# Patient Record
Sex: Male | Born: 1975
Health system: Southern US, Community
[De-identification: ages and names within clinical notes are randomized; demographics above are authoritative.]

## PROBLEM LIST (undated history)

## (undated) DIAGNOSIS — Z992 Dependence on renal dialysis: Secondary | ICD-10-CM

## (undated) DIAGNOSIS — K219 Gastro-esophageal reflux disease without esophagitis: Secondary | ICD-10-CM

## (undated) DIAGNOSIS — M7730 Calcaneal spur, unspecified foot: Secondary | ICD-10-CM

## (undated) DIAGNOSIS — I10 Essential (primary) hypertension: Secondary | ICD-10-CM

## (undated) DIAGNOSIS — L309 Dermatitis, unspecified: Secondary | ICD-10-CM

## (undated) DIAGNOSIS — Z91199 Patient's noncompliance with other medical treatment and regimen due to unspecified reason: Secondary | ICD-10-CM

## (undated) DIAGNOSIS — R52 Pain, unspecified: Secondary | ICD-10-CM

## (undated) DIAGNOSIS — N186 End stage renal disease: Secondary | ICD-10-CM

## (undated) DIAGNOSIS — F419 Anxiety disorder, unspecified: Secondary | ICD-10-CM

## (undated) DIAGNOSIS — G2581 Restless legs syndrome: Secondary | ICD-10-CM

## (undated) DIAGNOSIS — M1990 Unspecified osteoarthritis, unspecified site: Secondary | ICD-10-CM

## (undated) DIAGNOSIS — J45909 Unspecified asthma, uncomplicated: Secondary | ICD-10-CM

## (undated) DIAGNOSIS — T8571XA Infection and inflammatory reaction due to peritoneal dialysis catheter, initial encounter: Secondary | ICD-10-CM

## (undated) DIAGNOSIS — Z9119 Patient's noncompliance with other medical treatment and regimen: Secondary | ICD-10-CM

## (undated) DIAGNOSIS — G4733 Obstructive sleep apnea (adult) (pediatric): Secondary | ICD-10-CM

## (undated) DIAGNOSIS — Z9989 Dependence on other enabling machines and devices: Secondary | ICD-10-CM

## (undated) DIAGNOSIS — N189 Chronic kidney disease, unspecified: Secondary | ICD-10-CM

## (undated) HISTORY — DX: Unspecified osteoarthritis, unspecified site: M19.90

## (undated) HISTORY — DX: Chronic kidney disease, unspecified: N18.9

## (undated) HISTORY — PX: INSERTION OF DIALYSIS CATHETER: SHX1324

## (undated) HISTORY — DX: Essential (primary) hypertension: I10

## (undated) HISTORY — PX: OTHER SURGICAL HISTORY: SHX169

---

## 2007-11-07 ENCOUNTER — Emergency Department (HOSPITAL_COMMUNITY): Admission: EM | Admit: 2007-11-07 | Discharge: 2007-11-08 | Payer: Self-pay | Admitting: Emergency Medicine

## 2009-10-22 ENCOUNTER — Emergency Department (HOSPITAL_COMMUNITY): Admission: EM | Admit: 2009-10-22 | Discharge: 2009-10-22 | Payer: Self-pay | Admitting: Emergency Medicine

## 2010-07-24 LAB — DIFFERENTIAL
Basophils Absolute: 0 10*3/uL (ref 0.0–0.1)
Eosinophils Absolute: 0.2 10*3/uL (ref 0.0–0.7)
Monocytes Absolute: 0.5 10*3/uL (ref 0.1–1.0)
Monocytes Relative: 6 % (ref 3–12)
Neutro Abs: 5.2 10*3/uL (ref 1.7–7.7)

## 2010-07-24 LAB — CBC
HCT: 42.6 % (ref 39.0–52.0)
WBC: 8.5 10*3/uL (ref 4.0–10.5)

## 2010-07-24 LAB — BRAIN NATRIURETIC PEPTIDE: Pro B Natriuretic peptide (BNP): <30 pg/mL (ref 0.0–100.0)

## 2010-07-24 LAB — BASIC METABOLIC PANEL
CO2: 24 mEq/L (ref 19–32)
Chloride: 110 mEq/L (ref 96–112)
Creatinine, Ser: 1.64 mg/dL — ABNORMAL HIGH (ref 0.4–1.5)
Sodium: 139 mEq/L (ref 135–145)

## 2011-02-02 LAB — POCT CARDIAC MARKERS
CKMB, poc: 2.1
Myoglobin, poc: 183
Operator id: 237661
Troponin i, poc: <0.05

## 2013-02-25 ENCOUNTER — Encounter: Payer: Self-pay | Admitting: Physical Medicine & Rehabilitation

## 2013-03-05 ENCOUNTER — Encounter: Payer: Self-pay | Admitting: Physical Medicine & Rehabilitation

## 2013-03-05 ENCOUNTER — Encounter
Payer: Federal, State, Local not specified - PPO | Attending: Physical Medicine & Rehabilitation | Admitting: Physical Medicine & Rehabilitation

## 2013-03-05 ENCOUNTER — Encounter (INDEPENDENT_AMBULATORY_CARE_PROVIDER_SITE_OTHER): Payer: Self-pay

## 2013-03-05 VITALS — BP 162/91 | HR 81 | Resp 16 | Ht 67.0 in | Wt 232.0 lb

## 2013-03-05 DIAGNOSIS — Q649 Congenital malformation of urinary system, unspecified: Secondary | ICD-10-CM | POA: Insufficient documentation

## 2013-03-05 DIAGNOSIS — M79609 Pain in unspecified limb: Secondary | ICD-10-CM | POA: Insufficient documentation

## 2013-03-05 DIAGNOSIS — Z79899 Other long term (current) drug therapy: Secondary | ICD-10-CM

## 2013-03-05 DIAGNOSIS — M722 Plantar fascial fibromatosis: Secondary | ICD-10-CM

## 2013-03-05 DIAGNOSIS — E669 Obesity, unspecified: Secondary | ICD-10-CM | POA: Insufficient documentation

## 2013-03-05 DIAGNOSIS — N189 Chronic kidney disease, unspecified: Secondary | ICD-10-CM | POA: Insufficient documentation

## 2013-03-05 DIAGNOSIS — M109 Gout, unspecified: Secondary | ICD-10-CM | POA: Insufficient documentation

## 2013-03-05 DIAGNOSIS — I10 Essential (primary) hypertension: Secondary | ICD-10-CM | POA: Insufficient documentation

## 2013-03-05 DIAGNOSIS — M069 Rheumatoid arthritis, unspecified: Secondary | ICD-10-CM | POA: Insufficient documentation

## 2013-03-05 DIAGNOSIS — G8929 Other chronic pain: Secondary | ICD-10-CM | POA: Insufficient documentation

## 2013-03-05 DIAGNOSIS — Z5181 Encounter for therapeutic drug level monitoring: Secondary | ICD-10-CM

## 2013-03-05 MED ORDER — PREDNISONE 5 MG PO TABS: 5.0000 mg | ORAL_TABLET | Freq: Every day | ORAL | Status: DC

## 2013-03-05 MED ORDER — DICLOFENAC SODIUM 1 % TD GEL: 1.0000 "application " | Freq: Four times a day (QID) | TRANSDERMAL | Status: DC

## 2013-03-05 NOTE — Progress Notes (Signed)
Subjective:    Patient ID: James Kim, male    DOB: [birth year], 37 y.o.   MRN: [id number]  HPI  This is an initial visit for James Kim who is here regarding chronic foot pain by referral of Dr. Nolen Mu. He was diagnosed with rheumatoid arthritis about a year ago. He also has had chronic gout for several years prior. He was sent to Dr. Nolen Mu who began seeing him April 2014. He has been injecting his feet and treating plantar fasciitis. Per the patient, he's had 4 or 5 injections this year. The injections have typically provided a couple days worth of relief. The effects of the injections were usually limited by his other rheumatological pain.  In the past he saw a rheumatologist who was following his issues for a period of time but James Kim didn't feel that he was doing much for him, so he stopped going.  He sees a nephrologist yearly in regard to a chronic malformed kidney and a history of chronic kidney disease  (last level 2.6?). He has taken allopurinol for gout but was taken off due to effects on his kidney function.   He currently is using 4-5 norco 7.5/325mg  most days. He took his last pill this morning at 0130. He receives a 2 week supply typically from Dr. Nolen Mu. He's on no NSAID's because of his CKD.   He works at the [employer] in [employer] and is on his feet most of the day. He works the third shift. He has been working there for 4 years. He rarely has a chance to sit and rest.   Pain Inventory Average Pain 10 Pain Right Now 7 My pain is sharp, stabbing and aching  In the last 24 hours, has pain interfered with the following? General activity 10 Relation with others 6 Enjoyment of life 6 What TIME of day is your pain at its worst? evening Sleep (in general) Fair  Pain is worse with: walking, bending, inactivity and standing Pain improves with: medication Relief from Meds: 5  Mobility walk without assistance ability to climb steps?  yes do  you drive?  yes Do you have any goals in this area?  no  Function employed # of hrs/week [employer] Do you have any goals in this area?  no  Neuro/Psych anxiety  Prior Studies x-rays  Physicians involved in your care [facility]-podietrist   Family History  Problem Relation Age of Onset  . Stroke Mother   . Diabetes Father    History   Social History  . Marital Status: Married    Spouse Name: N/A    Number of Children: N/A  . Years of Education: N/A   Social History Main Topics  . Smoking status: Never Smoker   . Smokeless tobacco: Current User  . Alcohol Use: Yes  . Drug Use: No  . Sexual Activity: None   Other Topics Concern  . None   Social History Narrative  . None   History reviewed. No pertinent past surgical history. Past Medical History  Diagnosis Date  . Chronic kidney disease     Born with kidney defect  . Arthritis   . Asthma   . Hypertension    BP 162/91  Pulse 81  Resp 16  Ht 5\' 7"  (1.702 m)  Wt 232 lb (105.235 kg)  BMI 36.33 kg/m2  SpO2 98%     Review of Systems  Respiratory: Positive for apnea.   Cardiovascular: Positive for leg swelling.  Musculoskeletal: Positive for gait problem.  Psychiatric/Behavioral: The patient is nervous/anxious.   All other systems reviewed and are negative.       Objective:   Physical Exam   General: Alert and oriented x 3, No apparent distress. obese HEENT: Head is normocephalic, atraumatic, PERRLA, EOMI, sclera anicteric, oral mucosa pink and moist, dentition intact, ext ear canals clear,  Neck: Supple without JVD or lymphadenopathy Heart: Reg rate and rhythm. No murmurs rubs or gallops Chest: CTA bilaterally without wheezes, rales, or rhonchi; no distress Abdomen: Soft, non-tender, non-distended, bowel sounds positive. Extremities: No clubbing, cyanosis, or edema. Pulses are 2+ Skin: Clean and intact without signs of breakdown Neuro: Pt is cognitively appropriate with normal insight,  memory, and awareness. Cranial nerves 2-12 are intact. Sensory exam is normal even in the feet which are sensitive to touch due to pain. Reflexes are 2+ in all 4's. Fine motor coordination is intact. No tremors. Motor function is grossly 5/5.  Musculoskeletal:No joint deformities seen in either shoulder, elbow, or hand. No knee swelling or abnormalities are appreciated. Bilateral feet are notable for pain with palpation and heavy touch along both calcanei, metatarsal heads, and lateral weight bearing area of either feet. There was no evidence of breakdown. Feet did appear slightly red but they were not warm. He walks with significant antalgia on either foot,, perhaps left moreso than right. Arches are fairly preserved. There are no gross joint abnormalities in the feet. No tophi are seen.   Psych: Pt's affect is appropriate. Pt is cooperative. He is a little flat but James           1. Chronic foot pain with history of plantar fasciitis 2. Gouty arthritis primarily affecting the feet 3. ?Rheumatoid arthritis primarily affecting the feet 4. Chronic Kidney Disease with hx of malformed right kidney 5. Obesity.    Plan: 1. Will make a referral to rheumatology here in [place], Dr. Azzie Roup, because, as I told the patient, it doesn't make a whole lot of sense treating his pain if we are not effectively treating the cause. It's also curious that he is having no other systemic or anatomical manifestations of his pain. 2. For now given his diffuse pain and reported response to steroids in the past, will initiate low dose prednisone to assist with inflammation and pain control. I do think his arthritic foot disease is likely more of a problem than his planta fascia is at this point. 3. Additionally, I rx'ed voltaren gel for his feet. 4. I will consider rx'ing his hydrocodone pending a consistent UDS. Consider a long acting agent as well.  5. Discussed the importance of proper shoe wear and taking  rest breaks at work. 6. I will se him back in one month. He may pick up his rx for hydrocodone after his urine is cleared.

## 2013-03-05 NOTE — Patient Instructions (Signed)
CALL ME WITH ANY PROBLEMS OR QUESTIONS (#[phone]).  HAVE A GOOD DAY  Please find any rheumatological information and bring it with you to your rheum appointment we are scheduling

## 2013-03-13 ENCOUNTER — Telehealth: Payer: Self-pay

## 2013-03-13 NOTE — Telephone Encounter (Signed)
Patient called to follow up on UDS and treatment plan if his UDS was back.

## 2013-03-13 NOTE — Telephone Encounter (Signed)
Patient called to get his urine drug screen results.  He is having trouble with his foot and he is missing work because of it.  Please advise.

## 2013-03-14 MED ORDER — HYDROCODONE-ACETAMINOPHEN 7.5-325 MG PO TABS: 1.0000 | ORAL_TABLET | Freq: Four times a day (QID) | ORAL | Status: DC | PRN

## 2013-03-14 NOTE — Telephone Encounter (Signed)
Pain medication rx ready for pick up.  James Kim notified to bring license with him and will need to sign CSA.  These will be available at front desk.

## 2013-04-11 ENCOUNTER — Telehealth: Payer: Self-pay | Admitting: Physical Medicine & Rehabilitation

## 2013-04-11 ENCOUNTER — Encounter: Payer: Self-pay | Admitting: Physical Medicine & Rehabilitation

## 2013-04-11 ENCOUNTER — Encounter
Payer: Federal, State, Local not specified - PPO | Attending: Physical Medicine & Rehabilitation | Admitting: Physical Medicine & Rehabilitation

## 2013-04-11 VITALS — BP 163/98 | HR 99 | Resp 16 | Ht 66.0 in | Wt 223.0 lb

## 2013-04-11 DIAGNOSIS — M722 Plantar fascial fibromatosis: Secondary | ICD-10-CM | POA: Insufficient documentation

## 2013-04-11 DIAGNOSIS — M109 Gout, unspecified: Secondary | ICD-10-CM | POA: Insufficient documentation

## 2013-04-11 DIAGNOSIS — N189 Chronic kidney disease, unspecified: Secondary | ICD-10-CM | POA: Insufficient documentation

## 2013-04-11 DIAGNOSIS — M069 Rheumatoid arthritis, unspecified: Secondary | ICD-10-CM | POA: Insufficient documentation

## 2013-04-11 LAB — COMPREHENSIVE METABOLIC PANEL
ALT: 13 U/L (ref 0–53)
CO2: 17 mEq/L — ABNORMAL LOW (ref 19–32)
Creat: 2.82 mg/dL — ABNORMAL HIGH (ref 0.50–1.35)
Glucose, Bld: 102 mg/dL — ABNORMAL HIGH (ref 70–99)
Sodium: 138 mEq/L (ref 135–145)
Total Bilirubin: 0.4 mg/dL (ref 0.3–1.2)

## 2013-04-11 LAB — URIC ACID: Uric Acid, Serum: 10.5 mg/dL — ABNORMAL HIGH (ref 4.0–7.8)

## 2013-04-11 MED ORDER — COLCHICINE 0.6 MG PO TABS: 0.6000 mg | ORAL_TABLET | Freq: Every day | ORAL | Status: DC

## 2013-04-11 MED ORDER — PREDNISONE 20 MG PO TABS: 20.0000 mg | ORAL_TABLET | Freq: Every day | ORAL | Status: DC

## 2013-04-11 MED ORDER — COLCHICINE 0.6 MG PO TABS: 0.6000 mg | ORAL_TABLET | Freq: Two times a day (BID) | ORAL | Status: DC

## 2013-04-11 MED ORDER — HYDROCODONE-ACETAMINOPHEN 7.5-325 MG PO TABS: 1.0000 | ORAL_TABLET | Freq: Four times a day (QID) | ORAL | Status: DC | PRN

## 2013-04-11 NOTE — Progress Notes (Signed)
Subjective:    Patient ID: James Kim, male    DOB: 01-Jun-1975, 37 y.o.   MRN: [id number]  HPI  James Kim is back regarding his chronic foot pain. Apparently, Dr. Nolen Mu has held him out of work given his ongoing pain. He is only able to make it about 5-6 hours at max. His feet hurt tremendoulsy the longer he's up on them.   He feels the voltaren gel was helpful. The prednisone at 5mg  daily didn't make a whole lot of difference. He had problems scheduling the rheum visit----therefore it hasn't taken place yet. He brought notes from his prior rheumatologist from 2013 which discuss mgt of gouty arthritis.   He uses hydrocodone for breakthrough pain.   Pain Inventory Average Pain 7 Pain Right Now 4 My pain is sharp, stabbing and aching  In the last 24 hours, has pain interfered with the following? General activity 7 Relation with others 7 Enjoyment of life 7 What TIME of day is your pain at its worst? morning and evening Sleep (in general) Poor  Pain is worse with: walking, standing and some activites Pain improves with: rest and medication Relief from Meds: 8  Mobility walk without assistance ability to climb steps?  yes do you drive?  yes  Function employed # of hrs/week [employer] disabled: date disabled 03/03/2013  Neuro/Psych trouble walking anxiety  Prior Studies Any changes since last visit?  no  Physicians involved in your care Dr Su Hoff   Family History  Problem Relation Age of Onset  . Stroke Mother   . Diabetes Father    History   Social History  . Marital Status: Married    Spouse Name: N/A    Number of Children: N/A  . Years of Education: N/A   Social History Main Topics  . Smoking status: Never Smoker   . Smokeless tobacco: Current User  . Alcohol Use: Yes  . Drug Use: No  . Sexual Activity: None   Other Topics Concern  . None   Social History Narrative  . None   History reviewed. No pertinent past surgical  history. Past Medical History  Diagnosis Date  . Chronic kidney disease     Born with kidney defect  . Arthritis   . Asthma   . Hypertension    BP 163/98  Pulse 99  Resp 16  Ht 5\' 6"  (1.676 m)  Wt 223 lb (101.152 kg)  BMI 36.01 kg/m2  SpO2 97%     Review of Systems  Respiratory: Positive for apnea and cough.   Musculoskeletal: Positive for gait problem.  Psychiatric/Behavioral: The patient is nervous/anxious.   All other systems reviewed and are negative.       Objective:   Physical Exam  General: Alert and oriented x 3, No apparent distress. obese  HEENT: Head is normocephalic, atraumatic, PERRLA, EOMI, sclera anicteric, oral mucosa pink and moist, dentition intact, ext ear canals clear,  Neck: Supple without JVD or lymphadenopathy  Heart: Reg rate and rhythm. No murmurs rubs or gallops  Chest: CTA bilaterally without wheezes, rales, or rhonchi; no distress  Abdomen: Soft, non-tender, non-distended, bowel sounds positive.  Extremities: No clubbing, cyanosis, or edema. Pulses are 2+  Skin: Clean and intact without signs of breakdown  Neuro: Pt is cognitively appropriate with normal insight, memory, and awareness. Cranial nerves 2-12 are intact. Sensory exam is normal even in the feet which are sensitive to touch due to pain. Reflexes are 2+ in all 4's. Fine  motor coordination is intact. No tremors. Motor function is grossly 5/5.  Musculoskeletal:No joint deformities seen in either shoulder, elbow, or hand. No knee swelling or abnormalities are appreciated. Bilateral feet are notable for pain with palpation   touch along both calcanei, metatarsal heads, medial arch, and lateral weight bearing area of either feet. There was no evidence of breakdown. Feet   appear slightly red but they were not warm. He walks with significant antalgia on either foot,, perhaps left moreso than right. Arches are fairly preserved. There are no gross joint abnormalities in the feet. No tophi are  seen.  Psych: Pt's affect is appropriate. Pt is cooperative. He is a little flat but pleasant      1. Chronic foot pain with history of plantar fasciitis  2. Gouty arthritis primarily affecting the feet  3. ?Rheumatoid arthritis primarily affecting the feet  4. 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 Obesity.    Plan:  1. Needs rheumatology assessment. This referral was made at last visit.  I will go ahead and check a RF/ESR/uric acid level and cmet today 2. Increase prednisone to 34m with burst initially. Initiate colchicine 6mg  BID, holding for diarrhea 3. Continue voltaren gel for topical relief.  4. Hydrocodone was refilled today 5.He shall stay out of work until follow up with me next month. I don't see how he could tolerate standing given his current exam findings.  6..30 minutes of face to face patient care time were spent during this visit. All questions were encouraged and answered. Marland Kitchen

## 2013-04-11 NOTE — Patient Instructions (Signed)
CALL ME WITH ANY PROBLEMS OR QUESTIONS (#[phone]).    HAPPY HOLIDAYS!!!!!   

## 2013-04-11 NOTE — Telephone Encounter (Signed)
i contacted [name]. Recommended decreasing colchicine to once daily only.  He needs to see his nephrologist. Also needs to see rheum as i requested.

## 2013-04-14 LAB — ANA: Anti Nuclear Antibody(ANA): NEGATIVE

## 2013-05-07 ENCOUNTER — Telehealth: Payer: Self-pay

## 2013-05-07 NOTE — Telephone Encounter (Signed)
Patient called regarding foot pain.  He saw the specialist and they wanted to do an injection but he refused due to concern with violating contract.  He says they did an ultrasound and it showed many crystals.  Patient says he is eating 6 hydrocodone daily because of this pain.  He has an appointment 1/13.  Advised him to contact pcp or podiatrist since Dr Riley Kill is out of the office and the we are closed for New Years.  Also reminded him not to take more medication than directed.  He understands and agreed.

## 2013-05-20 ENCOUNTER — Encounter: Payer: Self-pay | Admitting: Physical Medicine & Rehabilitation

## 2013-05-20 ENCOUNTER — Encounter
Payer: Federal, State, Local not specified - PPO | Attending: Physical Medicine & Rehabilitation | Admitting: Physical Medicine & Rehabilitation

## 2013-05-20 VITALS — BP 159/103 | HR 73 | Resp 14 | Ht 66.0 in | Wt 247.0 lb

## 2013-05-20 DIAGNOSIS — M722 Plantar fascial fibromatosis: Secondary | ICD-10-CM

## 2013-05-20 DIAGNOSIS — M109 Gout, unspecified: Secondary | ICD-10-CM

## 2013-05-20 DIAGNOSIS — M069 Rheumatoid arthritis, unspecified: Secondary | ICD-10-CM | POA: Insufficient documentation

## 2013-05-20 DIAGNOSIS — N189 Chronic kidney disease, unspecified: Secondary | ICD-10-CM

## 2013-05-20 MED ORDER — HYDROCODONE-ACETAMINOPHEN 10-325 MG PO TABS
1.0000 | ORAL_TABLET | Freq: Four times a day (QID) | ORAL | Status: DC | PRN
Start: 2013-05-20 — End: 2013-06-16

## 2013-05-20 NOTE — Patient Instructions (Signed)
PLEASE CALL ME WITH ANY PROBLEMS OR QUESTIONS (#[phone]).     TRY TO WORK ON INCREASING YOUR AEROBIC ACTIVITY.

## 2013-05-20 NOTE — Progress Notes (Signed)
Subjective:    Patient ID: James Kim, male    DOB: Nov 22, 1975, 38 y.o.   MRN: [id number]  HPI  [name] is back regarding his chronic foot pain. He was seen by Dr. Durenda Age last week who placed him colchicine and uloric as well as aspirated his left great toe.  He remains on the prednisone. His pain is better when compared to an acute flare he had when he went to the office. Dr. Durenda Age kept him out of work until the end of April.   He is still limited by pain, swelling particularly with weight bearing. He has been in the bed quite a bit.  Pain Inventory Average Pain 8 Pain Right Now 6 My pain is sharp, stabbing and aching  In the last 24 hours, has pain interfered with the following? General activity 8 Relation with others 5 Enjoyment of life 9 What TIME of day is your pain at its worst? varies Sleep (in general) Poor  Pain is worse with: walking, standing and some activites Pain improves with: rest, heat/ice, medication and injections Relief from Meds: 5  Mobility use a cane how many minutes can you walk? 20-30 do you drive?  yes Do you have any goals in this area?  yes  Function employed # of hrs/week [employer] I need assistance with the following:  dressing and bathing Do you have any goals in this area?  yes  Neuro/Psych weakness tingling trouble walking anxiety  Prior Studies Any changes since last visit?  yes  Physicians involved in your care Any changes since last visit?  no   Family History  Problem Relation Age of Onset  . Stroke Mother   . Diabetes Father    History   Social History  . Marital Status: Married    Spouse Name: N/A    Number of Children: N/A  . Years of Education: N/A   Social History Main Topics  . Smoking status: Never Smoker   . Smokeless tobacco: Current User  . Alcohol Use: Yes  . Drug Use: No  . Sexual Activity: None   Other Topics Concern  . None   Social History Narrative  . None   History  reviewed. No pertinent past surgical history. Past Medical History  Diagnosis Date  . Chronic kidney disease     Born with kidney defect  . Arthritis   . Asthma   . Hypertension    BP 159/103  Pulse 73  Resp 14  Ht 5\' 6"  (1.676 m)  Wt 247 lb (112.038 kg)  BMI 39.89 kg/m2  SpO2 99%     Review of Systems  Respiratory: Positive for apnea and shortness of breath.   Musculoskeletal: Positive for gait problem.  Neurological: Positive for weakness.  Psychiatric/Behavioral: The patient is nervous/anxious.   All other systems reviewed and are negative.       Objective:   Physical Exam  General: Alert and oriented x 3, No apparent distress. obese  HEENT: Head is normocephalic, atraumatic, PERRLA, EOMI, sclera anicteric, oral mucosa pink and moist, dentition intact, ext ear canals clear,  Neck: Supple without JVD or lymphadenopathy  Heart: Reg rate and rhythm. No murmurs rubs or gallops  Chest: CTA bilaterally without wheezes, rales, or rhonchi; no distress  Abdomen: Soft, non-tender, non-distended, bowel sounds positive.  Extremities: No clubbing, cyanosis, or edema. Pulses are 2+  Skin: Clean and intact without signs of breakdown  Neuro: Pt is cognitively appropriate with normal insight, memory,  and awareness. Cranial nerves 2-12 are intact. Sensory exam is normal even in the feet which are sensitive to touch due to pain. Reflexes are 2+ in all 4's. Fine motor coordination is intact. No tremors. Motor function is grossly 5/5.  Musculoskeletal:No joint deformities seen in either shoulder, elbow, or hand. No knee swelling or abnormalities are appreciated. Bilateral feet are notable for pain with palpation touch along both calcanei, metatarsal heads, both MTP's medial arch, and lateral weight bearing area of either feet. No tophi. No edema noted. No redness or warmth.  There was no evidence of breakdown. FHe walks with significant antalgia on either foot, left moreso than right. Walks  with leg externally rotated to facilitate weight bearing.  Arches are fairly preserved. There are no gross joint abnormalities in the feet.   Psych: Pt's affect is appropriate. Pt is cooperative. He is a little flat but pleasant     ASSESSMENT:  1. Chronic foot pain with history of plantar fasciitis  2. Gouty arthritis primarily affecting the feet  3. ?Rheumatoid arthritis primarily affecting the feet  4. 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 Obesity.    Plan:  1. Rheum consult and treatment are appreciated.  2. Continue prednisone 20mg  per day as well colchicine 0.6mg  bid per rheum  3. Continue voltaren gel for topical relief.  4. Hydrocodone was increased to 10/325mg  q6 prn. May pick up rx next month.  5. Agree with out of work until  September 01, 2013. Encouraged mild aerobic activities and stretching to tolerance.  6..30 minutes of face to face patient care time were spent during this visit. All questions were encouraged and answered.  September 03, 2013

## 2013-06-16 ENCOUNTER — Telehealth: Payer: Self-pay

## 2013-06-16 MED ORDER — HYDROCODONE-ACETAMINOPHEN 10-325 MG PO TABS: 1.0000 | ORAL_TABLET | Freq: Four times a day (QID) | ORAL | Status: DC | PRN

## 2013-06-16 NOTE — Telephone Encounter (Signed)
Patient called reqeusting hydrocodone refill.  Rx printed, will call patient when signed and ready for pick up.

## 2013-06-17 NOTE — Telephone Encounter (Signed)
Left a message on a verified voicemail to inform patient that his RX was ready for pickup.

## 2013-07-15 ENCOUNTER — Encounter: Payer: Self-pay | Admitting: Physical Medicine & Rehabilitation

## 2013-07-15 ENCOUNTER — Encounter
Payer: Federal, State, Local not specified - PPO | Attending: Physical Medicine & Rehabilitation | Admitting: Physical Medicine & Rehabilitation

## 2013-07-15 VITALS — BP 165/104 | HR 80 | Resp 14 | Ht 66.0 in | Wt 244.0 lb

## 2013-07-15 DIAGNOSIS — N189 Chronic kidney disease, unspecified: Secondary | ICD-10-CM | POA: Insufficient documentation

## 2013-07-15 DIAGNOSIS — M069 Rheumatoid arthritis, unspecified: Secondary | ICD-10-CM

## 2013-07-15 DIAGNOSIS — M722 Plantar fascial fibromatosis: Secondary | ICD-10-CM | POA: Insufficient documentation

## 2013-07-15 DIAGNOSIS — M109 Gout, unspecified: Secondary | ICD-10-CM | POA: Insufficient documentation

## 2013-07-15 MED ORDER — HYDROCODONE-ACETAMINOPHEN 10-325 MG PO TABS: 1.0000 | ORAL_TABLET | Freq: Four times a day (QID) | ORAL | Status: DC | PRN

## 2013-07-15 MED ORDER — MORPHINE SULFATE ER 15 MG PO TBCR: 15.0000 mg | EXTENDED_RELEASE_TABLET | Freq: Two times a day (BID) | ORAL | Status: DC

## 2013-07-15 NOTE — Progress Notes (Signed)
Subjective:    Patient ID: James Kim, male    DOB: 10-11-1975, 38 y.o.   MRN: [id number]  HPI  Mr. Nauta is back regarding his chronic foot pain. His creatinine bumped to 3+. His colchicine was stopped as well as his doxycycline. He is following up with nephrology regarding ongoing mgt. He is trying to eat healthier and to lose weight.   His hydrocodone helps to an extent, but he's not getting a very long duration of relief. He tries to do some walking but it's very limited due to pain.     Pain Inventory Average Pain 8 Pain Right Now 6 My pain is stabbing and aching  In the last 24 hours, has pain interfered with the following? General activity 8 Relation with others 5 Enjoyment of life 8 What TIME of day is your pain at its worst? morning and evening Sleep (in general) Poor  Pain is worse with: walking, standing and some activites Pain improves with: rest and medication Relief from Meds: 8  Mobility walk without assistance how many minutes can you walk? 15-20 ability to climb steps?  yes do you drive?  yes Do you have any goals in this area?  yes  Function employed # of hrs/week [employer] disabled: date disabled 02/2013 Do you have any goals in this area?  yes  Neuro/Psych bladder control problems weakness trouble walking anxiety  Prior Studies Any changes since last visit?  no  Physicians involved in your care Any changes since last visit?  no   Family History  Problem Relation Age of Onset  . Stroke Mother   . Diabetes Father    History   Social History  . Marital Status: Married    Spouse Name: N/A    Number of Children: N/A  . Years of Education: N/A   Social History Main Topics  . Smoking status: Never Smoker   . Smokeless tobacco: Current User  . Alcohol Use: Yes  . Drug Use: No  . Sexual Activity: None   Other Topics Concern  . None   Social History Narrative  . None   History reviewed. No pertinent past  surgical history. Past Medical History  Diagnosis Date  . Chronic kidney disease     Born with kidney defect  . Arthritis   . Asthma   . Hypertension    BP 165/104  Pulse 80  Resp 14  Ht 5\' 6"  (1.676 m)  Wt 244 lb (110.678 kg)  BMI 39.40 kg/m2  SpO2 99%  Opioid Risk Score:   Fall Risk Score: Low Fall Risk (0-5 points) (patient educated handout declined)   Review of Systems  Respiratory: Positive for apnea and shortness of breath.   Cardiovascular: Positive for leg swelling.  Genitourinary: Positive for difficulty urinating.  Musculoskeletal: Positive for gait problem.  Neurological: Positive for weakness.  All other systems reviewed and are negative.       Objective:   Physical Exam General: Alert and oriented x 3, No apparent distress. Obese, weight unchanged. HEENT: Head is normocephalic, atraumatic, PERRLA, EOMI, sclera anicteric, oral mucosa pink and moist, dentition intact, ext ear canals clear,  Neck: Supple without JVD or lymphadenopathy  Heart: Reg rate and rhythm. No murmurs rubs or gallops  Chest: CTA bilaterally without wheezes, rales, or rhonchi; no distress  Abdomen: Soft, non-tender, non-distended, bowel sounds positive.  Extremities: No clubbing, cyanosis, or edema. Pulses are 2+  Skin: Clean and intact without signs of breakdown  Neuro: Pt is cognitively appropriate with normal insight, memory, and awareness. Cranial nerves 2-12 are intact. Sensory exam is normal even in the feet which are sensitive to touch due to pain. Reflexes are 2+ in all 4's. Fine motor coordination is intact. No tremors. Motor function is grossly 5/5.  Musculoskeletal:No joint deformities seen in either shoulder, elbow, or hand. No knee swelling or abnormalities are appreciated. Bilateral feet continue to be painful with palpation along both calcanei, metatarsal heads, both MTP's medial arch, and lateral weight bearing area of either feet. No tophi. No edema noted. No redness or  warmth. There was no evidence of breakdown. He walks with significant antalgia on either foot, left moreso than right. Walks with leg externally rotated to facilitate weight bearing and to decrease pain.  Arches are fairly preserved. There are no gross joint abnormalities in the feet.  Psych: Pt's affect is appropriate. Pt is cooperative. He is a little flat but pleasant    ASSESSMENT:  1. Chronic foot pain with history of plantar fasciitis  2. Gouty arthritis primarily affecting the feet   3. ?Rheumatoid arthritis primarily affecting the feet  4. 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 Obesity.   Plan:  1. Rheum consult and treatment ongoing. Colchicine stopped due to renal dysfunction. 2. Continue prednisone  And uloric per rheum recs---this may need to be stopped as well given renal issues. 3. Continue voltaren gel for topical relief.  4. Hydrocodone will continue for pain #120 rxed. Also will add ms contin 15mg  q12 #60 rxed. 5. Out of work indefinitely.   6. 30 minutes of face to face patient care time were spent during this visit. All questions were encouraged and answered. I will see him back in about a month. 

## 2013-07-15 NOTE — Patient Instructions (Signed)
PLEASE CALL ME WITH ANY PROBLEMS OR QUESTIONS (#[phone]).      

## 2013-08-11 ENCOUNTER — Telehealth: Payer: Self-pay

## 2013-08-11 NOTE — Telephone Encounter (Signed)
Patient called requesting hydrocodone 10 and morphine 15 refills.

## 2013-08-12 MED ORDER — MORPHINE SULFATE ER 15 MG PO TBCR: 15.0000 mg | EXTENDED_RELEASE_TABLET | Freq: Two times a day (BID) | ORAL | Status: DC

## 2013-08-12 MED ORDER — HYDROCODONE-ACETAMINOPHEN 10-325 MG PO TABS: 1.0000 | ORAL_TABLET | Freq: Four times a day (QID) | ORAL | Status: DC | PRN

## 2013-08-12 NOTE — Telephone Encounter (Signed)
Hydrocodone and morphine rx printed for Dr Riley Kill to sign.  Will contact patient when ready.

## 2013-08-12 NOTE — Telephone Encounter (Signed)
Notified James Kim rx will be available to pick up in am.

## 2013-08-13 ENCOUNTER — Encounter
Payer: Federal, State, Local not specified - PPO | Attending: Physical Medicine & Rehabilitation | Admitting: Registered Nurse

## 2013-08-13 ENCOUNTER — Encounter: Payer: Self-pay | Admitting: Registered Nurse

## 2013-08-13 VITALS — BP 153/95 | HR 66 | Resp 12 | Ht 66.0 in | Wt 235.0 lb

## 2013-08-13 DIAGNOSIS — M722 Plantar fascial fibromatosis: Secondary | ICD-10-CM

## 2013-08-13 DIAGNOSIS — M069 Rheumatoid arthritis, unspecified: Secondary | ICD-10-CM

## 2013-08-13 DIAGNOSIS — N189 Chronic kidney disease, unspecified: Secondary | ICD-10-CM

## 2013-08-13 DIAGNOSIS — M109 Gout, unspecified: Secondary | ICD-10-CM

## 2013-08-13 MED ORDER — ALPRAZOLAM 0.5 MG PO TABS: 0.5000 mg | ORAL_TABLET | Freq: Every day | ORAL | Status: DC | PRN

## 2013-08-13 NOTE — Progress Notes (Signed)
Subjective:    Patient ID: James Kim, male    DOB: 1975/09/21, 38 y.o.   MRN: [id number]  HPI: Mr. DUFF POZZI is a 38 year old male who  returns for follow up for chronic foot pain and medication refill. He has a history of plantar fasciitis, and chronic gout. He presents with bilateral calcaneous pain and tenderness. When he is walking he has painful, burning, stabbing pain . When he arose this morning his pain level was 8/10. He states" I havebone spurs as well". After taking his medication his pain decreased to 5/10. He rates his pain level  5/10. Current exercise regime is walking.  He's being followed for his CKD, he denies any uremic symptoms. He was scheduled for a Renal Biopsy on 08/04/13 he was hypertensive at this time and they were unable to performed. He relates this to increase anxiety asking for something to help him. He's reschedule for biopsy on the 28th or 29th of April.      Pain Inventory Average Pain 8 Pain Right Now 5 My pain is sharp, stabbing and aching  In the last 24 hours, has pain interfered with the following? General activity 8 Relation with others 5 Enjoyment of life 9 What TIME of day is your pain at its worst? morning, evening Sleep (in general) Poor  Pain is worse with: walking and standing Pain improves with: rest and heat/ice Relief from Meds: 7  Mobility walk without assistance how many minutes can you walk? 60 ability to climb steps?  yes do you drive?  yes Do you have any goals in this area?  yes  Function employed # of hrs/week [employer] Do you have any goals in this area?  yes  Neuro/Psych weakness trouble walking anxiety  Prior Studies Any changes since last visit?  no  Physicians involved in your care Any changes since last visit?  no   Family History  Problem Relation Age of Onset  . Stroke Mother   . Diabetes Father    History   Social History  . Marital Status: Married    Spouse Name: N/A   Number of Children: N/A  . Years of Education: N/A   Social History Main Topics  . Smoking status: Never Smoker   . Smokeless tobacco: Current User  . Alcohol Use: Yes  . Drug Use: No  . Sexual Activity: None   Other Topics Concern  . None   Social History Narrative  . None   History reviewed. No pertinent past surgical history. Past Medical History  Diagnosis Date  . Chronic kidney disease     Born with kidney defect  . Arthritis   . Asthma   . Hypertension    BP 153/95  Pulse 66  Resp 12  Ht 5\' 6"  (1.676 m)  Wt 235 lb (106.595 kg)  BMI 37.95 kg/m2  SpO2 96%  Opioid Risk Score:   Fall Risk Score: Moderate Fall Risk (6-13 points) (patient educated handout declined)   Review of Systems  Musculoskeletal: Positive for gait problem.  All other systems reviewed and are negative.      Objective:   Physical Exam  Nursing note and vitals reviewed. Constitutional: He appears well-developed and well-nourished.  HENT:  Head: Normocephalic.  Neck: Normal range of motion.  Cardiovascular: Normal rate, regular rhythm and normal heart sounds.   Pulmonary/Chest: Effort normal and breath sounds normal.  Musculoskeletal:  Normal Muscle Bulk. Muscle testing reveals 5/5. Antalgic gait.Walks with  a limp. He is carrying his left leg as he walks. ROM: WNL          Assessment & Plan:     1. Chroni foot pain with history of plantar fasciitis: Refilled: HYDROcodone 10/325 mg #120 and MS CONTIN 15 mg #60.  2.Chronic Kidney Disease: Schedule for Renal Biopsy this month. Nephrology Following. 3. Anxiety: RX: Xanax 0.5 mg daily/prn #15    30 minutes of face to face patient care time was spent during this visit. All questions were encouraged and answered.  Marland Kitchen

## 2013-09-10 ENCOUNTER — Other Ambulatory Visit: Payer: Self-pay | Admitting: Physical Medicine & Rehabilitation

## 2013-09-10 ENCOUNTER — Encounter
Payer: Federal, State, Local not specified - PPO | Attending: Physical Medicine & Rehabilitation | Admitting: Physical Medicine & Rehabilitation

## 2013-09-10 ENCOUNTER — Encounter: Payer: Self-pay | Admitting: Physical Medicine & Rehabilitation

## 2013-09-10 VITALS — BP 158/85 | HR 73 | Resp 14 | Ht 66.0 in | Wt 233.0 lb

## 2013-09-10 DIAGNOSIS — Z5181 Encounter for therapeutic drug level monitoring: Secondary | ICD-10-CM | POA: Insufficient documentation

## 2013-09-10 DIAGNOSIS — M109 Gout, unspecified: Secondary | ICD-10-CM

## 2013-09-10 DIAGNOSIS — N189 Chronic kidney disease, unspecified: Secondary | ICD-10-CM | POA: Insufficient documentation

## 2013-09-10 DIAGNOSIS — Z79899 Other long term (current) drug therapy: Secondary | ICD-10-CM | POA: Insufficient documentation

## 2013-09-10 DIAGNOSIS — M722 Plantar fascial fibromatosis: Secondary | ICD-10-CM | POA: Insufficient documentation

## 2013-09-10 MED ORDER — HYDROCODONE-ACETAMINOPHEN 10-325 MG PO TABS: 1.0000 | ORAL_TABLET | Freq: Four times a day (QID) | ORAL | Status: DC | PRN

## 2013-09-10 MED ORDER — FENTANYL 12 MCG/HR TD PT72: 12.5000 ug | MEDICATED_PATCH | TRANSDERMAL | Status: DC

## 2013-09-10 NOTE — Patient Instructions (Signed)
PLEASE CALL ME WITH ANY PROBLEMS OR QUESTIONS (#[phone]).      

## 2013-09-10 NOTE — Progress Notes (Signed)
Subjective:    Patient ID: James Kim, male    DOB: [birth year], 38 y.o.   MRN: [id number]  HPI  James Kim is back regarding his chronic foot pain. He stopped the ms contin per nephrology as they felt it was causing nausea. The patient states the nausea started after beginning the medication.   He remains on uloric and prednisone for his gout rx. He is still having a lot of pain on his heels and his left forefoot.    He tolerates the hydrocodone without any issues.   He continues to see nephrology regarding his kidney disease. Apparently nephrology feels that he's endstage and will eventually need HD.  Pain Inventory Average Pain 7 Pain Right Now 4 My pain is sharp, stabbing and aching  In the last 24 hours, has pain interfered with the following? General activity 8 Relation with others 4 Enjoyment of life 8 What TIME of day is your pain at its worst? morning and evening Sleep (in general) Poor  Pain is worse with: walking and standing Pain improves with: rest, heat/ice and medication Relief from Meds: 6  Mobility walk without assistance how many minutes can you walk? 30 ability to climb steps?  yes do you drive?  yes Do you have any goals in this area?  yes  Function disabled: date disabled 03/03/2013 Do you have any goals in this area?  yes  Neuro/Psych trouble walking anxiety  Prior Studies Any changes since last visit?  no  Physicians involved in your care [facility]   Family History  Problem Relation Age of Onset  . Stroke Mother   . Diabetes Father    History   Social History  . Marital Status: Married    Spouse Name: N/A    Number of Children: N/A  . Years of Education: N/A   Social History Main Topics  . Smoking status: Never Smoker   . Smokeless tobacco: Current User  . Alcohol Use: Yes  . Drug Use: No  . Sexual Activity: None   Other Topics Concern  . None   Social History Narrative  . None    History reviewed. No pertinent past surgical history. Past Medical History  Diagnosis Date  . Chronic kidney disease     Born with kidney defect  . Arthritis   . Asthma   . Hypertension    BP 158/85  Pulse 73  Resp 14  Ht 5\' 6"  (1.676 m)  Wt 233 lb (105.688 kg)  BMI 37.63 kg/m2  SpO2 98%  Opioid Risk Score:   Fall Risk Score: Moderate Fall Risk (6-13 points) (patient educated handout declined)   Review of Systems  Constitutional: Positive for appetite change.  Respiratory: Positive for apnea and shortness of breath.   Gastrointestinal: Positive for vomiting.  Musculoskeletal: Positive for gait problem.  Psychiatric/Behavioral: The patient is nervous/anxious.   All other systems reviewed and are negative.      Objective:   Physical Exam  General: Alert and oriented x 3, No apparent distress. Obese, weight unchanged.  HEENT: Head is normocephalic, atraumatic, PERRLA, EOMI, sclera anicteric, oral mucosa pink and moist, dentition intact, ext ear canals clear,  Neck: Supple without JVD or lymphadenopathy  Heart: Reg rate and rhythm. No murmurs rubs or gallops  Chest: CTA bilaterally without wheezes, rales, or rhonchi; no distress  Abdomen: Soft, non-tender, non-distended, bowel sounds positive.  Extremities: No clubbing, cyanosis, or edema. Pulses are 2+  Skin: Clean and intact  without signs of breakdown  Neuro: Pt is cognitively appropriate with normal insight, memory, and awareness. Cranial nerves 2-12 are intact. Sensory exam is normal even in the feet which are sensitive to touch due to pain. Reflexes are 2+ in all 4's. Fine motor coordination is intact. No tremors. Motor function is grossly 5/5.  Musculoskeletal:No joint deformities seen in either shoulder, elbow, or hand. No knee swelling or abnormalities are appreciated. Bilateral feet continue to be painful with palpation along both calcanei, metatarsal heads, both MTP's medial arch, and lateral weight bearing area  of either feet. No tophi. No edema noted. No redness or warmth. There was no evidence of breakdown. He walks with significant antalgia on either foot, left moreso than right. Walks with leg externally rotated to facilitate weight bearing and to decrease pain.  He has difficulty with weight bearing on the left MT, Heel and right heel. Arches are fairly preserved. There are no gross joint abnormalities in the feet.  Psych: Pt's affect is appropriate. Pt is cooperative. He is a little flat but pleasant    ASSESSMENT:  1. Chronic foot pain with history of plantar fasciitis  2. Gouty arthritis primarily affecting the feet  3. ?Rheumatoid arthritis primarily affecting the feet  4. 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 Obesity.    Plan:  1. Uloric and prednisone for gout rx.  2.Will add fentanyl patch for baseline pain control. Application instructions were provided. Will not resume morphine given nausea.  3. Continue voltaren gel for topical relief.  4. Hydrocodone will continue for pain #120 rxed.   5. Out of work indefinitely.  6. 15 minutes of face to face patient care time were spent during this visit. All questions were encouraged and answered. I will see him back in about a month.  Marland Kitchen

## 2013-10-03 NOTE — Progress Notes (Signed)
Urine drug screen from 09/10/2013 was inconsistent.  Hydrocodone was not present.

## 2013-10-09 ENCOUNTER — Ambulatory Visit: Payer: Federal, State, Local not specified - PPO | Admitting: Registered Nurse

## 2013-10-16 ENCOUNTER — Encounter: Payer: Self-pay | Admitting: Registered Nurse

## 2013-10-16 ENCOUNTER — Encounter
Payer: Federal, State, Local not specified - PPO | Attending: Physical Medicine & Rehabilitation | Admitting: Registered Nurse

## 2013-10-16 VITALS — BP 153/100 | HR 64 | Resp 14 | Ht 66.0 in | Wt 243.0 lb

## 2013-10-16 DIAGNOSIS — M109 Gout, unspecified: Secondary | ICD-10-CM

## 2013-10-16 DIAGNOSIS — N189 Chronic kidney disease, unspecified: Secondary | ICD-10-CM | POA: Insufficient documentation

## 2013-10-16 DIAGNOSIS — M722 Plantar fascial fibromatosis: Secondary | ICD-10-CM | POA: Insufficient documentation

## 2013-10-16 DIAGNOSIS — Z79899 Other long term (current) drug therapy: Secondary | ICD-10-CM | POA: Insufficient documentation

## 2013-10-16 DIAGNOSIS — Z5181 Encounter for therapeutic drug level monitoring: Secondary | ICD-10-CM | POA: Insufficient documentation

## 2013-10-16 MED ORDER — FENTANYL 12 MCG/HR TD PT72: 12.5000 ug | MEDICATED_PATCH | TRANSDERMAL | Status: DC

## 2013-10-16 MED ORDER — ALPRAZOLAM 0.5 MG PO TABS: 0.5000 mg | ORAL_TABLET | Freq: Every day | ORAL | Status: DC | PRN

## 2013-10-16 MED ORDER — HYDROCODONE-ACETAMINOPHEN 10-325 MG PO TABS: 1.0000 | ORAL_TABLET | Freq: Four times a day (QID) | ORAL | Status: DC | PRN

## 2013-10-16 NOTE — Progress Notes (Signed)
Subjective:    Patient ID: James Kim, male    DOB: 23-Nov-1975, 38 y.o.   MRN: [id number]  HPI: James Kim is a 38 year old male who returns for follow up for chronic pain and medication refill. He says his pain is located in his bilateral feet and achilles tendon. Also complaining pain in his bilateral calf's.He rates his pain 6. He is in lot of pain today, ran out of his medications. His current exercise regime is walking daily for 45 minutes a day.  Pain Inventory Average Pain 8 Pain Right Now 6 My pain is sharp and stabbing  In the last 24 hours, has pain interfered with the following? General activity 9 Relation with others 6 Enjoyment of life 10 What TIME of day is your pain at its worst? morning and evening Sleep (in general) Poor  Pain is worse with: walking and standing Pain improves with: rest and heat/ice Relief from Meds: 6  Mobility walk without assistance how many minutes can you walk? 45 ability to climb steps?  yes do you drive?  yes  Function disabled: date disabled 03/03/13  Neuro/Psych weakness trouble walking anxiety  Prior Studies Any changes since last visit?  no  Physicians involved in your care Any changes since last visit?  no   Family History  Problem Relation Age of Onset  . Stroke Mother   . Diabetes Father    History   Social History  . Marital Status: Married    Spouse Name: N/A    Number of Children: N/A  . Years of Education: N/A   Social History Main Topics  . Smoking status: Never Smoker   . Smokeless tobacco: Current User  . Alcohol Use: Yes  . Drug Use: No  . Sexual Activity: None   Other Topics Concern  . None   Social History Narrative  . None   History reviewed. No pertinent past surgical history. Past Medical History  Diagnosis Date  . Chronic kidney disease     Born with kidney defect  . Arthritis   . Asthma   . Hypertension    BP 153/100  Pulse 64  Resp 14  Ht 5\' 6"  (1.676 m)   Wt 243 lb (110.224 kg)  BMI 39.24 kg/m2  SpO2 99%  Opioid Risk Score:   Fall Risk Score: Moderate Fall Risk (6-13 points) (previously educated and handout declined)  Review of Systems  Constitutional: Positive for appetite change and unexpected weight change.  Respiratory: Positive for apnea and shortness of breath.   Gastrointestinal: Positive for nausea.  Musculoskeletal: Positive for gait problem.  Neurological: Positive for weakness.  Psychiatric/Behavioral: The patient is nervous/anxious.   All other systems reviewed and are negative.      Objective:   Physical Exam  Nursing note and vitals reviewed. Constitutional: He is oriented to person, place, and time. He appears well-developed and well-nourished.  HENT:  Head: Normocephalic and atraumatic.  Neck: Normal range of motion. Neck supple.  Cardiovascular: Normal rate and regular rhythm.   Pulmonary/Chest: Effort normal and breath sounds normal.  Musculoskeletal:  Normal Muscle Bulk and Muscle Testing Reveals: Upper Extremities: Full Rom and Muscle strength 5/5 Back without spinal or paraspinal tenderness Lower extremities: Right Leg: Full ROM and Muscle Strength 5/5. Flexion Produces Pain into heel. Left Leg: Full ROM and Muscle strength 5/5. Flexion Produces Pain into achilles tendon Arises from chair with ease. Antalgic gait  Neurological: He is alert and oriented to person,  place, and time.  Skin: Skin is warm and dry.  Psychiatric: He has a normal mood and affect.          Assessment & Plan:  1.Chronic foot pain with history of plantar fasciitis: Refilled: HYDROcodone 10/325 mg #120 and MS CONTIN 15 mg #60.  2.Chronic Kidney Disease: Nephrology Following.  3. Anxiety: Refilled: Xanax 0.5 mg daily/prn # 20   30 minutes of face to face patient care time was spent during this visit. All questions were encouraged and answered.  .  F/U in 1 month

## 2013-11-14 ENCOUNTER — Encounter
Payer: Federal, State, Local not specified - PPO | Attending: Physical Medicine & Rehabilitation | Admitting: Registered Nurse

## 2013-11-14 ENCOUNTER — Encounter: Payer: Self-pay | Admitting: Registered Nurse

## 2013-11-14 VITALS — BP 165/99 | HR 70 | Resp 14 | Wt 250.0 lb

## 2013-11-14 DIAGNOSIS — M722 Plantar fascial fibromatosis: Secondary | ICD-10-CM | POA: Insufficient documentation

## 2013-11-14 DIAGNOSIS — N184 Chronic kidney disease, stage 4 (severe): Secondary | ICD-10-CM

## 2013-11-14 DIAGNOSIS — Z79899 Other long term (current) drug therapy: Secondary | ICD-10-CM

## 2013-11-14 DIAGNOSIS — M109 Gout, unspecified: Secondary | ICD-10-CM | POA: Insufficient documentation

## 2013-11-14 DIAGNOSIS — Z5181 Encounter for therapeutic drug level monitoring: Secondary | ICD-10-CM | POA: Insufficient documentation

## 2013-11-14 MED ORDER — FENTANYL 25 MCG/HR TD PT72: 25.0000 ug | MEDICATED_PATCH | TRANSDERMAL | Status: DC

## 2013-11-14 MED ORDER — HYDROCODONE-ACETAMINOPHEN 10-325 MG PO TABS: 1.0000 | ORAL_TABLET | Freq: Four times a day (QID) | ORAL | Status: DC | PRN

## 2013-11-14 NOTE — Progress Notes (Signed)
Subjective:    Patient ID: James Kim, male    DOB: Aug 22, 1975, 38 y.o.   MRN: [id number]  HPI: [name] is a 38 year old male who returns for follow up for chronic pain and medication refill. He says his pain is located in his bilateral feet and achilles tendon.He rates his pain 7. His current exercise regime is walking daily, also going up and down the stairs in his home several times a day for exercise. He says he has been having more sharp pain and feels as though the fentanyl patches aren't lasting more than a day. In contact with Dr. Riley Kim and he agrees with the plan of increasing the Fentanyl patch 25 mcg every other day.  Pain Inventory Average Pain 8 Pain Right Now 7 My pain is sharp, stabbing and aching  In the last 24 hours, has pain interfered with the following? General activity 7 Relation with others 5 Enjoyment of life 9 What TIME of day is your pain at its worst? morning and evening Sleep (in general) Poor  Pain is worse with: walking, standing and some activites Pain improves with: rest, heat/ice and medication Relief from Meds: 7  Mobility walk without assistance how many minutes can you walk? 25-30 ability to climb steps?  yes do you drive?  yes  Function disabled: date disabled 03/04/13 I need assistance with the following:  household duties  Neuro/Psych weakness trouble walking anxiety  Prior Studies Any changes since last visit?  no  Physicians involved in your care Any changes since last visit?  no   Family History  Problem Relation Age of Onset  . Stroke Mother   . Diabetes Father    History   Social History  . Marital Status: Married    Spouse Name: N/A    Number of Children: N/A  . Years of Education: N/A   Social History Main Topics  . Smoking status: Never Smoker   . Smokeless tobacco: Current User  . Alcohol Use: Yes  . Drug Use: No  . Sexual Activity: None   Other Topics Concern  . None   Social  History Narrative  . None   History reviewed. No pertinent past surgical history. Past Medical History  Diagnosis Date  . Chronic kidney disease     Born with kidney defect  . Arthritis   . Asthma   . Hypertension    BP 165/99  Pulse 70  Resp 14  Wt 250 lb (113.399 kg)  SpO2 100%  Opioid Risk Score:   Fall Risk Score: Moderate Fall Risk (6-13 points) (previously educated and today he was given and handout on fall prevention in the home)  Review of Systems  Constitutional: Positive for appetite change.  Respiratory: Positive for apnea and shortness of breath.   Cardiovascular: Positive for leg swelling.  Musculoskeletal: Positive for gait problem.  Neurological: Positive for weakness.  Psychiatric/Behavioral: The patient is nervous/anxious.   All other systems reviewed and are negative.      Objective:   Physical Exam  Nursing note and vitals reviewed. Constitutional: He is oriented to person, place, and time. He appears well-developed and well-nourished.  HENT:  Head: Normocephalic and atraumatic.  Neck: Normal range of motion. Neck supple.  Cardiovascular: Normal rate and regular rhythm.   Pulmonary/Chest: Effort normal and breath sounds normal.  Musculoskeletal:  Normal Muscle Bulk and Muscle Testing Reveals: Upper Extremities: Full ROM and Muscle Strength 5/5 Lower Extremities: Full ROM and Muscle  Strength 5/5 Bilateral Flexion Produces Pain into heel Arises from chair with ease Narrow based Gait  Neurological: He is oriented to person, place, and time.  Skin: Skin is warm and dry.  Psychiatric: He has a normal mood and affect.          Assessment & Plan:  1.Chronic foot pain with history of plantar fasciitis: Refilled: HYDROcodone 10/325 mg one tablet every 6 hours as needed #120 and [id number] to  Fentanyl 25 mcg one patch every other day #10 2.Chronic Kidney Disease: Nephrology Following.  3. Anxiety: Continue Xanax 0.5 mg daily/prn # 20   30  minutes of face to face patient care time was spent during this visit. All questions were encouraged and answered.  .  F/U in 1 month

## 2013-12-12 ENCOUNTER — Telehealth: Payer: Self-pay

## 2013-12-12 NOTE — Telephone Encounter (Signed)
Santina Evans @ [address] [employer] is requesting a return call regarding OV on 6/16. She is patient's WC rep. Patient's claim # [id number]

## 2013-12-15 NOTE — Telephone Encounter (Signed)
Attempted to contact Santina Evans. Left a voicemail to return call to clinic.

## 2013-12-16 ENCOUNTER — Encounter: Payer: Self-pay | Admitting: Registered Nurse

## 2013-12-16 ENCOUNTER — Encounter
Payer: Federal, State, Local not specified - PPO | Attending: Physical Medicine & Rehabilitation | Admitting: Registered Nurse

## 2013-12-16 VITALS — BP 136/69 | HR 69 | Resp 14 | Ht 67.0 in | Wt 248.0 lb

## 2013-12-16 DIAGNOSIS — Z5181 Encounter for therapeutic drug level monitoring: Secondary | ICD-10-CM

## 2013-12-16 DIAGNOSIS — Z79899 Other long term (current) drug therapy: Secondary | ICD-10-CM | POA: Insufficient documentation

## 2013-12-16 DIAGNOSIS — M109 Gout, unspecified: Secondary | ICD-10-CM | POA: Insufficient documentation

## 2013-12-16 DIAGNOSIS — M722 Plantar fascial fibromatosis: Secondary | ICD-10-CM | POA: Diagnosis not present

## 2013-12-16 MED ORDER — HYDROCODONE-ACETAMINOPHEN 10-325 MG PO TABS: 1.0000 | ORAL_TABLET | Freq: Four times a day (QID) | ORAL | Status: DC | PRN

## 2013-12-16 MED ORDER — FENTANYL 25 MCG/HR TD PT72: 25.0000 ug | MEDICATED_PATCH | TRANSDERMAL | Status: DC

## 2013-12-16 MED ORDER — ALPRAZOLAM 0.5 MG PO TABS: 0.5000 mg | ORAL_TABLET | Freq: Every day | ORAL | Status: DC | PRN

## 2013-12-16 NOTE — Progress Notes (Signed)
Subjective:    Patient ID: James Kim, male    DOB: 01/30/1976, 38 y.o.   MRN: [id number]  HPI: [name] is a 38 year old male who returns for follow up for chronic pain and medication refill. He says his pain is located in his bilateral feet and achilles tendon.He rates his pain 6. His current exercise regime is walking daily. He has been encouraged to increase his activity and endurance. He verbalizes understanding.  He says two weeks ago he was going down the basement stairs and his right leg gave out and he fell down the stairs and landed on his right buttock. He was able to get up by himself. He didn't seek medical attention.  He believes he has an UTI he called his Nephrologist he has an follow up with the nephrologist. He says at home he has been experiencing some hypertension. He was educated on keeping a log of his blood presures and share with his nephrologist. He verbalizes understanding.   Pain Inventory Average Pain 8 Pain Right Now 6 My pain is sharp, burning, stabbing and aching  In the last 24 hours, has pain interfered with the following? General activity 10 Relation with others 8 Enjoyment of life 10 What TIME of day is your pain at its worst? morning, evening Sleep (in general) Poor  Pain is worse with: walking, standing and some activites Pain improves with: rest, heat/ice and medication Relief from Meds: 6  Mobility walk without assistance how many minutes can you walk? 15-20 ability to climb steps?  yes do you drive?  yes transfers alone Do you have any goals in this area?  yes  Function disabled: date disabled 03/03/2013 I need assistance with the following:  household duties Do you have any goals in this area?  yes  Neuro/Psych weakness trouble walking anxiety  Prior Studies Any changes since last visit?  no  Physicians involved in your care Any changes since last visit?  no   Family History  Problem Relation Age of  Onset  . Stroke Mother   . Diabetes Father    History   Social History  . Marital Status: Married    Spouse Name: N/A    Number of Children: N/A  . Years of Education: N/A   Social History Main Topics  . Smoking status: Never Smoker   . Smokeless tobacco: Current User  . Alcohol Use: Yes  . Drug Use: No  . Sexual Activity: None   Other Topics Concern  . None   Social History Narrative  . None   History reviewed. No pertinent past surgical history. Past Medical History  Diagnosis Date  . Chronic kidney disease     Born with kidney defect  . Arthritis   . Asthma   . Hypertension    BP 136/69  Pulse 69  Resp 14  Ht 5\' 7"  (1.702 m)  Wt 248 lb (112.492 kg)  BMI 38.83 kg/m2  SpO2 100%  Opioid Risk Score:   Fall Risk Score: Moderate Fall Risk (6-13 points) (pt educated declined brochure)   Review of Systems  Constitutional: Positive for chills and appetite change.  Respiratory: Positive for apnea and shortness of breath.   Cardiovascular: Positive for leg swelling.  Musculoskeletal: Positive for back pain and gait problem.  Neurological: Positive for weakness.  Psychiatric/Behavioral: The patient is nervous/anxious.   All other systems reviewed and are negative.      Objective:   Physical Exam  Nursing note and vitals reviewed. Constitutional: He is oriented to person, place, and time. He appears well-developed and well-nourished.  HENT:  Head: Normocephalic and atraumatic.  Neck: Normal range of motion. Neck supple.  Cardiovascular: Normal rate and regular rhythm.   Pulmonary/Chest: Effort normal and breath sounds normal.  Musculoskeletal:  Normal Muscle Bulk and Muscle Testing Reveals: Upper Extremities: Full ROM and Muscle strength 5/5 Lower Extremities: Full ROM and Muscle Strength 5/5/ Bilateral Flexion Produces Pain into calves Arises from chair with ease Antalgic Gait    Neurological: He is alert and oriented to person, place, and time.    Skin: Skin is warm and dry.  Psychiatric: He has a normal mood and affect.          Assessment & Plan:  1.Chronic foot pain with history of plantar fasciitis: Refilled: HYDROcodone 10/325 mg one tablet every 6 hours as needed #120 and Fentanyl 25 mcg one patch every other day #10  2.Chronic Kidney Disease: Nephrology Following.  3. Anxiety: Continue. Refilled:  Xanax 0.5 mg daily/prn # 20   20 minutes of face to face patient care time was spent during this visit. All questions were encouraged and answered.  .  F/U in 1 month

## 2013-12-17 ENCOUNTER — Telehealth: Payer: Self-pay | Admitting: *Deleted

## 2013-12-17 NOTE — Telephone Encounter (Signed)
Alison Stalling from disability calims department of ?[employer]? calling to request information to determine time period James Kim is out of work.  He has appt in our office today.  I called Natalia Leatherwood back and she says she will fax over the information she is requesting.

## 2013-12-19 ENCOUNTER — Telehealth: Payer: Self-pay | Admitting: *Deleted

## 2013-12-19 DIAGNOSIS — N186 End stage renal disease: Secondary | ICD-10-CM | POA: Insufficient documentation

## 2013-12-19 DIAGNOSIS — N051 Unspecified nephritic syndrome with focal and segmental glomerular lesions: Secondary | ICD-10-CM | POA: Insufficient documentation

## 2013-12-19 DIAGNOSIS — E669 Obesity, unspecified: Secondary | ICD-10-CM | POA: Insufficient documentation

## 2013-12-19 DIAGNOSIS — I10 Essential (primary) hypertension: Secondary | ICD-10-CM | POA: Insufficient documentation

## 2013-12-19 NOTE — Telephone Encounter (Signed)
Called about Dr note for out of work.  He is going to need something more detailed.  Please call.

## 2013-12-22 DIAGNOSIS — E785 Hyperlipidemia, unspecified: Secondary | ICD-10-CM | POA: Insufficient documentation

## 2013-12-22 DIAGNOSIS — G4733 Obstructive sleep apnea (adult) (pediatric): Secondary | ICD-10-CM | POA: Insufficient documentation

## 2013-12-22 DIAGNOSIS — E538 Deficiency of other specified B group vitamins: Secondary | ICD-10-CM | POA: Insufficient documentation

## 2013-12-22 DIAGNOSIS — K219 Gastro-esophageal reflux disease without esophagitis: Secondary | ICD-10-CM | POA: Insufficient documentation

## 2013-12-22 NOTE — Telephone Encounter (Signed)
Attempted to contact patient. Left a voicemail for patient to return call.

## 2013-12-22 NOTE — Telephone Encounter (Signed)
I typed the letter and had you sign stating out of work indefinitely as documented in your 09/10/13 visit note.  He is saying they need something more detailed as to why he is out of work indefinitely.

## 2013-12-30 NOTE — Telephone Encounter (Signed)
FYI : I called James Kim to see if he could come in for appt tomorrow 12/31/13 with Dr Riley Kill so they could review his disability papers.  He cannot come in because he just had surgery and cannot drive for a week.  He has had a peritoneal catheter placed for planned peritoneal dialysis.  His surgeon has given him a short term rx for norco stating he would need to increase his dose temporarily.  I explained our policy about surgeons prescribing during perioperative and post operative period and then return to Dr Riley Kill for resumption of care. This is just a short term increase and I have noted that information.

## 2013-12-31 ENCOUNTER — Ambulatory Visit: Payer: Federal, State, Local not specified - PPO | Admitting: Physical Medicine & Rehabilitation

## 2014-01-14 ENCOUNTER — Encounter: Payer: Self-pay | Admitting: Registered Nurse

## 2014-01-14 ENCOUNTER — Encounter
Payer: Federal, State, Local not specified - PPO | Attending: Physical Medicine & Rehabilitation | Admitting: Registered Nurse

## 2014-01-14 VITALS — BP 125/80 | HR 86 | Resp 14 | Wt 235.0 lb

## 2014-01-14 DIAGNOSIS — Z5181 Encounter for therapeutic drug level monitoring: Secondary | ICD-10-CM | POA: Insufficient documentation

## 2014-01-14 DIAGNOSIS — M722 Plantar fascial fibromatosis: Secondary | ICD-10-CM | POA: Diagnosis present

## 2014-01-14 DIAGNOSIS — N184 Chronic kidney disease, stage 4 (severe): Secondary | ICD-10-CM

## 2014-01-14 DIAGNOSIS — M109 Gout, unspecified: Secondary | ICD-10-CM | POA: Insufficient documentation

## 2014-01-14 DIAGNOSIS — Z79899 Other long term (current) drug therapy: Secondary | ICD-10-CM | POA: Diagnosis not present

## 2014-01-14 MED ORDER — FENTANYL 25 MCG/HR TD PT72: 25.0000 ug | MEDICATED_PATCH | TRANSDERMAL | Status: DC

## 2014-01-14 MED ORDER — HYDROCODONE-ACETAMINOPHEN 10-325 MG PO TABS: 1.0000 | ORAL_TABLET | Freq: Four times a day (QID) | ORAL | Status: DC | PRN

## 2014-01-14 NOTE — Progress Notes (Signed)
Subjective:    Patient ID: James Kim, male    DOB: [birth year], 38 y.o.   MRN: [id number]  HPI: [name] is a 38 year old male who returns for follow up for chronic pain and medication refill. He says his pain is located in his bilateral feet and achilles tendon.He rates his pain 6. He hasn't followed a exercise regime for a few weeks. He had a Tenckhoff Peritoneal Dialysis Catheter Placed on 12/29/13 Dressing dry and intact. Tenderness with palpation. He has been encouraged to increase his activity as tolerated.  [name] expressed his financial hardship with no disability checks coming in. [employer] stopped his checks, he faxed a letter from Dr. Riley Kill with no reply. I called [employer] [phone] and spoke to Ms. Philbert Riser ext. [phone]. We faxed the letter to [employer] and she forward the letter to disability claim department. This was convey to [name].   Pain Inventory Average Pain 8 Pain Right Now 6 My pain is sharp and aching  In the last 24 hours, has pain interfered with the following? General activity 10 Relation with others 10 Enjoyment of life 10 What TIME of day is your pain at its worst? morning, evening Sleep (in general) Poor  Pain is worse with: walking, bending and some activites Pain improves with: rest, heat/ice and medication Relief from Meds: 6  Mobility walk without assistance how many minutes can you walk? 20 ability to climb steps?  yes do you drive?  yes  Function disabled: date disabled 03/04/13 I need assistance with the following:  dressing and bathing  Neuro/Psych weakness trouble walking anxiety  Prior Studies Any changes since last visit?  yes peritoneal catheter placed for dialysis  Physicians involved in your care Any changes since last visit?  no   Family History  Problem Relation Age of Onset  . Stroke Mother   . Diabetes Father    History   Social History  . Marital Status: Married    Spouse Name: N/A   Number of Children: N/A  . Years of Education: N/A   Social History Main Topics  . Smoking status: Never Smoker   . Smokeless tobacco: Current User  . Alcohol Use: Yes  . Drug Use: No  . Sexual Activity: None   Other Topics Concern  . None   Social History Narrative  . None   History reviewed. No pertinent past surgical history. Past Medical History  Diagnosis Date  . Chronic kidney disease     Born with kidney defect  . Arthritis   . Asthma   . Hypertension    BP 125/80  Pulse 86  Resp 14  Wt 235 lb (106.595 kg)  SpO2 99%  Opioid Risk Score:   Fall Risk Score: Moderate Fall Risk (6-13 points) (previoulsy educated and given handout 11/14/13)  Review of Systems  Constitutional: Positive for appetite change and unexpected weight change.  Respiratory: Positive for apnea and shortness of breath.   Cardiovascular: Positive for leg swelling.  Gastrointestinal: Positive for nausea.  Musculoskeletal: Positive for gait problem.  Neurological: Positive for weakness.  Psychiatric/Behavioral: The patient is nervous/anxious.   All other systems reviewed and are negative.      Objective:   Physical Exam  Nursing note and vitals reviewed. Constitutional: He is oriented to person, place, and time. He appears well-developed and well-nourished.  HENT:  Head: Normocephalic and atraumatic.  Neck: Normal range of motion. Neck supple.  Cardiovascular: Normal rate and regular  rhythm.   Pulmonary/Chest: Effort normal and breath sounds normal.  Musculoskeletal:  Normal Muscle Bulk and Muscle Testing Reveals: Upper Extremities: Full ROM and Muscle strength 5/5 Lower Extremities: Full ROM and Muscle Strength 5/5 Arises from chair with ease Antalgic Gait  Neurological: He is alert and oriented to person, place, and time.  Skin: Skin is warm and dry.  Psychiatric: He has a normal mood and affect.          Assessment & Plan:  1.Chronic foot pain with history of plantar  fasciitis: Refilled: HYDROcodone 10/325 mg one tablet every 6 hours as needed #120 and Fentanyl 25 mcg one patch every other day #10  2.Chronic Kidney Disease:  Tenckhoff Catheter Placed 12/29/13 Nephrology Following.  3. Anxiety: Continue: Xanax 0.5 mg daily/prn # 20   20 minutes of face to face patient care time was spent during this visit. All questions were encouraged and answered.  .  F/U in 1 month

## 2014-01-30 ENCOUNTER — Telehealth: Payer: Self-pay | Admitting: *Deleted

## 2014-01-30 NOTE — Telephone Encounter (Signed)
Spoke with case worker Natalia Leatherwood to let her know that the patient at this time will be out of work in definitely.  F/U on 02/11/14.

## 2014-01-31 ENCOUNTER — Emergency Department (HOSPITAL_COMMUNITY)
Admission: EM | Admit: 2014-01-31 | Discharge: 2014-02-01 | Disposition: A | Discharge status: Home / Self Care | Payer: BC Managed Care – PPO | Attending: Emergency Medicine | Admitting: Emergency Medicine

## 2014-01-31 ENCOUNTER — Encounter (HOSPITAL_COMMUNITY): Payer: Self-pay | Admitting: Emergency Medicine

## 2014-01-31 DIAGNOSIS — Z79899 Other long term (current) drug therapy: Secondary | ICD-10-CM | POA: Diagnosis not present

## 2014-01-31 DIAGNOSIS — M129 Arthropathy, unspecified: Secondary | ICD-10-CM | POA: Diagnosis not present

## 2014-01-31 DIAGNOSIS — Y939 Activity, unspecified: Secondary | ICD-10-CM | POA: Insufficient documentation

## 2014-01-31 DIAGNOSIS — S0510XA Contusion of eyeball and orbital tissues, unspecified eye, initial encounter: Secondary | ICD-10-CM | POA: Insufficient documentation

## 2014-01-31 DIAGNOSIS — Z992 Dependence on renal dialysis: Secondary | ICD-10-CM | POA: Diagnosis not present

## 2014-01-31 DIAGNOSIS — X58XXXA Exposure to other specified factors, initial encounter: Secondary | ICD-10-CM | POA: Diagnosis not present

## 2014-01-31 DIAGNOSIS — N186 End stage renal disease: Secondary | ICD-10-CM | POA: Diagnosis not present

## 2014-01-31 DIAGNOSIS — S058X9A Other injuries of unspecified eye and orbit, initial encounter: Secondary | ICD-10-CM | POA: Insufficient documentation

## 2014-01-31 DIAGNOSIS — S0501XA Injury of conjunctiva and corneal abrasion without foreign body, right eye, initial encounter: Secondary | ICD-10-CM

## 2014-01-31 DIAGNOSIS — Y929 Unspecified place or not applicable: Secondary | ICD-10-CM | POA: Diagnosis not present

## 2014-01-31 DIAGNOSIS — Z791 Long term (current) use of non-steroidal anti-inflammatories (NSAID): Secondary | ICD-10-CM | POA: Diagnosis not present

## 2014-01-31 DIAGNOSIS — J45909 Unspecified asthma, uncomplicated: Secondary | ICD-10-CM | POA: Diagnosis not present

## 2014-01-31 DIAGNOSIS — I120 Hypertensive chronic kidney disease with stage 5 chronic kidney disease or end stage renal disease: Secondary | ICD-10-CM | POA: Insufficient documentation

## 2014-01-31 MED ORDER — TETRACAINE HCL 0.5 % OP SOLN
2.0000 [drp] | Freq: Once | OPHTHALMIC | Status: AC
  Administered 2014-02-01: 2 [drp] via OPHTHALMIC
  Filled 2014-01-31: qty 2

## 2014-01-31 MED ORDER — FLUORESCEIN SODIUM 1 MG OP STRP
1.0000 | ORAL_STRIP | Freq: Once | OPHTHALMIC | Status: AC
  Administered 2014-02-01: 1 via OPHTHALMIC
  Filled 2014-01-31: qty 1

## 2014-01-31 NOTE — ED Notes (Signed)
Pt states his eyes have been red and watery since this morning.

## 2014-02-01 MED ORDER — TOBRAMYCIN 0.3 % OP SOLN
1.0000 [drp] | Freq: Once | OPHTHALMIC | Status: AC
  Administered 2014-02-01: 1 [drp] via OPHTHALMIC
  Filled 2014-02-01: qty 5

## 2014-02-01 MED ORDER — KETOROLAC TROMETHAMINE 0.5 % OP SOLN
1.0000 [drp] | Freq: Once | OPHTHALMIC | Status: AC
  Administered 2014-02-01: 1 [drp] via OPHTHALMIC
  Filled 2014-02-01: qty 5

## 2014-02-01 NOTE — ED Provider Notes (Signed)
Medical screening examination/treatment/procedure(s) were performed by non-physician practitioner and as supervising physician I was immediately available for consultation/collaboration.   EKG Interpretation None       Donnetta Hutching, MD 02/01/14 2354

## 2014-02-01 NOTE — Discharge Instructions (Signed)
Corneal Abrasion °The cornea is the clear covering at the front and center of the eye. When you look at the colored portion of the eye, you are looking through the cornea. It is a thin tissue made up of layers. The top layer is the most sensitive layer. A corneal abrasion happens if this layer is scratched or an injury causes it to come off.  °HOME CARE °· You may be given drops or a medicated cream. Use the medicine as told by your doctor. °· A pressure patch may be put over the eye. If this is done, follow your doctor's instructions for when to remove the patch. Do not drive or use machines while the eye patch is on. Judging distances is hard to do with a patch on. °· See your doctor for a follow-up exam if you are told to do so. It is very important that you keep this appointment. °GET HELP IF:  °· You have pain, are sensitive to light, and have a scratchy feeling in one eye or both eyes. °· Your pressure patch keeps getting loose. You can blink your eye under the patch. °· You have fluid coming from your eye or the lids stick together in the morning. °· You have the same symptoms in the morning that you did with the first abrasion. This could be days, weeks, or months after the first abrasion healed. °MAKE SURE YOU:  °· Understand these instructions. °· Will watch your condition. °· Will get help right away if you are not doing well or get worse. °Document Released: 10/11/2007 Document Revised: 02/12/2013 Document Reviewed: 12/30/2012 °[employer]® Patient Information ©2015 [employer]. This information is not intended to replace advice given to you by your health care provider. Make sure you discuss any questions you have with your health care provider. ° °

## 2014-02-01 NOTE — ED Provider Notes (Signed)
CSN: [id number]     Arrival date & time 01/31/14  2202 History   First MD Initiated Contact with Patient 01/31/14 2250     Chief Complaint  Patient presents with  . eye irritation      (Consider location/radiation/quality/duration/timing/severity/associated sxs/prior Treatment) HPI  James Kim is a 38 y.o. male who has recently started peritoneal dialysis secondary to CKD and HTN presents to the Emergency Department complaining of redness and irritation to both eyes since waking up this morning.  He reports symptoms are worse to the right eye.  He states that his eyes are normally red, but he now has increased tearing, photophobia, and foreign body sensation to the right eye.  He denies known injury, vision loss, headache, dizziness, weakness, or fever.  He reports wearing prescription reading glasses, but denies contact use.    Past Medical History  Diagnosis Date  . Chronic kidney disease     Born with kidney defect  . Arthritis   . Asthma   . Hypertension    History reviewed. No pertinent past surgical history. Family History  Problem Relation Age of Onset  . Stroke Mother   . Diabetes Father    History  Substance Use Topics  . Smoking status: Never Smoker   . Smokeless tobacco: Current User  . Alcohol Use: Yes    Review of Systems  Constitutional: Negative for fever, chills, activity change and appetite change.  HENT: Negative for congestion, ear pain, facial swelling and sore throat.   Eyes: Positive for photophobia, redness, itching and visual disturbance.       Excessive tearing  Respiratory: Negative for cough and shortness of breath.   Gastrointestinal: Negative for nausea and vomiting.  Skin: Negative for rash.  Neurological: Negative for dizziness, syncope, facial asymmetry, speech difficulty, weakness, light-headedness, numbness and headaches.  Hematological: Negative for adenopathy.  All other systems reviewed and are negative.     Allergies   Sulfa antibiotics and Sulfur  Home Medications   Prior to Admission medications   Medication Sig Start Date End Date Taking? Authorizing Provider  ALPRAZolam Prudy Feeler) 0.5 MG tablet Take 1 tablet (0.5 mg total) by mouth daily as needed for anxiety. 12/16/13   Jacalyn Lefevre, NP  amLODipine (NORVASC) 10 MG tablet Take 10 mg by mouth daily.    Historical Provider, MD  atorvastatin (LIPITOR) 10 MG tablet Take 10 mg by mouth at bedtime.    Historical Provider, MD  carbidopa-levodopa (SINEMET IR) 25-100 MG per tablet Take 1 tablet by mouth at bedtime.    Historical Provider, MD  diclofenac sodium (VOLTAREN) 1 % GEL Apply 1 application topically 4 (four) times daily. 03/05/13   Ranelle Oyster, MD  escitalopram (LEXAPRO) 10 MG tablet Take 10 mg by mouth at bedtime.    Historical Provider, MD  febuxostat (ULORIC) 40 MG tablet Take 40 mg by mouth daily.    Historical Provider, MD  fentaNYL (DURAGESIC - DOSED MCG/HR) 25 MCG/HR patch Place 1 patch (25 mcg total) onto the skin every 3 (three) days. 01/14/14   Jacalyn Lefevre, NP  HYDROcodone-acetaminophen (NORCO) 10-325 MG per tablet Take 1 tablet by mouth every 6 (six) hours as needed. 1 month supply 01/14/14   Jacalyn Lefevre, NP  metoprolol succinate (TOPROL-XL) 50 MG 24 hr tablet Take 50 mg by mouth daily. Take with or immediately following a meal.    Historical Provider, MD  minoxidil (LONITEN) 2.5 MG tablet Take 5 mg by mouth daily.    Historical Provider,  MD  pantoprazole (PROTONIX) 40 MG tablet Take 40 mg by mouth daily before breakfast.    Historical Provider, MD  traZODone (DESYREL) 50 MG tablet Take 50 mg by mouth 2 (two) times daily at 10 AM and 5 PM. At bedtime    Historical Provider, MD  vitamin C (ASCORBIC ACID) 500 MG tablet Take 500 mg by mouth daily.    Historical Provider, MD  Vitamin D, Ergocalciferol, (DRISDOL) 50000 UNITS CAPS capsule Take 50,000 Units by mouth every 7 (seven) days.    Historical Provider, MD   BP 130/71  Pulse 74   Temp(Src) 98.2 F (36.8 C) (Oral)  Resp 18  Ht 5\' 6"  (1.676 m)  Wt 241 lb (109.317 kg)  BMI 38.92 kg/m2  SpO2 100% Physical Exam  Nursing note and vitals reviewed. Constitutional: He is oriented to person, place, and time. He appears well-developed and well-nourished. No distress.  HENT:  Head: Normocephalic and atraumatic.  Mouth/Throat: Oropharynx is clear and moist.  Eyes: EOM and lids are normal. Pupils are equal, round, and reactive to light. Lids are everted and swept, no foreign bodies found. Right eye exhibits no chemosis and no exudate. No foreign body present in the right eye. Left eye exhibits no chemosis and no exudate. No foreign body present in the left eye. Right conjunctiva is injected. Right conjunctiva has no hemorrhage. Left conjunctiva is injected. Left conjunctiva has no hemorrhage. Right eye exhibits normal extraocular motion. Left eye exhibits normal extraocular motion. Pupils are equal.  Fundoscopic exam:      The right eye shows no papilledema.       The left eye shows no papilledema.  Slit lamp exam:      The right eye shows corneal abrasion and fluorescein uptake. The right eye shows no corneal flare, no corneal ulcer, no foreign body, no hyphema, no hypopyon and no anterior chamber bulge.       The left eye shows no anterior chamber bulge.  Small corneal abrasion at 6 o'clock position  Neck: Normal range of motion. Neck supple.  Cardiovascular: Normal rate, normal heart sounds and intact distal pulses.   No murmur heard. Pulmonary/Chest: Effort normal and breath sounds normal. No respiratory distress.  Musculoskeletal: Normal range of motion.  Lymphadenopathy:    He has no cervical adenopathy.  Neurological: He is alert and oriented to person, place, and time. He exhibits normal muscle tone. Coordination normal.  Skin: Skin is warm and dry.    ED Course  Procedures (including critical care time) Labs Review Labs Reviewed - No data to display  Imaging  Review No results found.   EKG Interpretation None      MDM   Final diagnoses:  Corneal abrasion, right, initial encounter    IOP of right eye measured with tonopen avg : 11 mmHg    Visual Acuity  Right Eye Distance: 20/70 Left Eye Distance: 20/20 Bilateral Distance: 20/30  Right Eye Near: R Near: 20/50 Left Eye Near:  L Near: 20/30 Bilateral Near:  20/40   Pt appears to have a small corneal abrasion at 6 o'clock position.  No FB present, no hyphema or opacity of the cornea.  Pt is feeling better after exam.  Dispensed ketorolac and tobramycin.  Pt agrees to also use OTC lubricating drops, warm compresses and close ophthalmalgic f/u on Monday.  Pt also advised to return here for any worsening symptoms.  Appears stable for d/c  Rochelle Nephew L. Saturday, PA-C 02/01/14 2121

## 2014-02-01 NOTE — ED Notes (Signed)
Discharge instructions given, pt demonstrated teach back and verbal understanding. No concerns voiced.  

## 2014-02-11 ENCOUNTER — Encounter: Payer: BC Managed Care – PPO | Attending: Physical Medicine & Rehabilitation | Admitting: Registered Nurse

## 2014-02-11 ENCOUNTER — Encounter: Payer: Self-pay | Admitting: Registered Nurse

## 2014-02-11 VITALS — BP 137/76 | HR 78 | Resp 18 | Wt 245.0 lb

## 2014-02-11 DIAGNOSIS — Z79899 Other long term (current) drug therapy: Secondary | ICD-10-CM | POA: Insufficient documentation

## 2014-02-11 DIAGNOSIS — N185 Chronic kidney disease, stage 5: Secondary | ICD-10-CM | POA: Insufficient documentation

## 2014-02-11 DIAGNOSIS — Z5181 Encounter for therapeutic drug level monitoring: Secondary | ICD-10-CM | POA: Diagnosis not present

## 2014-02-11 DIAGNOSIS — M1009 Idiopathic gout, multiple sites: Secondary | ICD-10-CM

## 2014-02-11 DIAGNOSIS — M722 Plantar fascial fibromatosis: Secondary | ICD-10-CM | POA: Diagnosis not present

## 2014-02-11 DIAGNOSIS — M109 Gout, unspecified: Secondary | ICD-10-CM

## 2014-02-11 MED ORDER — ALPRAZOLAM 0.5 MG PO TABS: 0.5000 mg | ORAL_TABLET | Freq: Every day | ORAL | Status: DC | PRN

## 2014-02-11 MED ORDER — HYDROCODONE-ACETAMINOPHEN 10-325 MG PO TABS: 1.0000 | ORAL_TABLET | Freq: Four times a day (QID) | ORAL | Status: DC | PRN

## 2014-02-11 NOTE — Progress Notes (Signed)
Subjective:    Patient ID: James Kim, male    DOB: [birth year], 38 y.o.   MRN: [id number]  HPI: [name] is a 38 year old male who returns for follow up for chronic pain and medication refill. He says his pain is located in his bilateral feet.He rates his pain 4. His current exercise regime is walking 20 minutes a day. He started Peritoneal Dialysis with 4 exchanges daily.   Pain Inventory Average Pain 7 Pain Right Now 4 My pain is constant, sharp, stabbing and aching  In the last 24 hours, has pain interfered with the following? General activity 7 Relation with others 6 Enjoyment of life 7 What TIME of day is your pain at its worst? morning  And evening Sleep (in general) Poor  Pain is worse with: walking, standing and some activites Pain improves with: rest, heat/ice and medication Relief from Meds: 7  Mobility walk without assistance  Function disabled: date disabled 03/04/2013  Neuro/Psych weakness trouble walking anxiety  Prior Studies Any changes since last visit?  no  Physicians involved in your care Any changes since last visit?  no   Family History  Problem Relation Age of Onset  . Stroke Mother   . Diabetes Father    History   Social History  . Marital Status: Married    Spouse Name: N/A    Number of Children: N/A  . Years of Education: N/A   Social History Main Topics  . Smoking status: Never Smoker   . Smokeless tobacco: Current User  . Alcohol Use: Yes  . Drug Use: No  . Sexual Activity: None   Other Topics Concern  . None   Social History Narrative  . None   History reviewed. No pertinent past surgical history. Past Medical History  Diagnosis Date  . Chronic kidney disease     Born with kidney defect  . Arthritis   . Asthma   . Hypertension    BP 137/76  Pulse 78  Resp 18  Wt 245 lb (111.131 kg)  SpO2 99%  Opioid Risk Score:   Fall Risk Score:     Review of Systems     Objective:   Physical  Exam  Nursing note and vitals reviewed. Constitutional: He is oriented to person, place, and time. He appears well-developed and well-nourished.  HENT:  Head: Normocephalic and atraumatic.  Neck: Neck supple.  Cardiovascular: Normal rate and regular rhythm.   Pulmonary/Chest: Effort normal and breath sounds normal.  Abdominal:  Tenckhoff Catheter Intact  Musculoskeletal:  Normal Muscle Bulk and Muscle testing Reveals: Upper extremities: Full ROM and Muscle strength 5/5 Lower Extremities: Full ROM and Muscle strength 5/5 Arises from chair with ease Narrow Based Gait  Neurological: He is alert and oriented to person, place, and time.  Skin: Skin is warm and dry.  Psychiatric: He has a normal mood and affect.          Assessment & Plan:  1.Chronic foot pain with history of plantar fasciitis: Refilled: HYDROcodone 10/325 mg one tablet every 6 hours as needed #120 and Script not given for Fentanyl: was Filled 02/09/2014, due to financial hardship. Continue Fentanyl 25 mcg one patch every other day #10  2.Chronic Kidney Disease stage V:On Peritoneal Dialysis/ Nephrology Following. 3. Anxiety: Continue: Xanax 0.5 mg daily/prn # 20   20 minutes of face to face patient care time was spent during this visit. All questions were encouraged and answered.  .  F/U  in 1 month

## 2014-02-23 DIAGNOSIS — IMO0001 Reserved for inherently not codable concepts without codable children: Secondary | ICD-10-CM | POA: Insufficient documentation

## 2014-03-09 ENCOUNTER — Encounter: Payer: BC Managed Care – PPO | Attending: Physical Medicine & Rehabilitation | Admitting: Registered Nurse

## 2014-03-09 ENCOUNTER — Encounter: Payer: Self-pay | Admitting: Registered Nurse

## 2014-03-09 VITALS — BP 125/55 | HR 74 | Resp 14 | Ht 66.0 in | Wt 253.0 lb

## 2014-03-09 DIAGNOSIS — Z5181 Encounter for therapeutic drug level monitoring: Secondary | ICD-10-CM | POA: Insufficient documentation

## 2014-03-09 DIAGNOSIS — M1009 Idiopathic gout, multiple sites: Secondary | ICD-10-CM | POA: Diagnosis not present

## 2014-03-09 DIAGNOSIS — M722 Plantar fascial fibromatosis: Secondary | ICD-10-CM | POA: Diagnosis not present

## 2014-03-09 DIAGNOSIS — N185 Chronic kidney disease, stage 5: Secondary | ICD-10-CM | POA: Insufficient documentation

## 2014-03-09 DIAGNOSIS — M109 Gout, unspecified: Secondary | ICD-10-CM

## 2014-03-09 DIAGNOSIS — Z79899 Other long term (current) drug therapy: Secondary | ICD-10-CM | POA: Diagnosis present

## 2014-03-09 MED ORDER — FENTANYL 25 MCG/HR TD PT72: 25.0000 ug | MEDICATED_PATCH | TRANSDERMAL | Status: DC

## 2014-03-09 MED ORDER — ALPRAZOLAM 0.5 MG PO TABS: 0.5000 mg | ORAL_TABLET | Freq: Every day | ORAL | Status: DC | PRN

## 2014-03-09 MED ORDER — HYDROCODONE-ACETAMINOPHEN 10-325 MG PO TABS
1.0000 | ORAL_TABLET | Freq: Four times a day (QID) | ORAL | Status: DC | PRN
Start: 2014-03-09 — End: 2014-03-09

## 2014-03-09 MED ORDER — HYDROCODONE-ACETAMINOPHEN 10-325 MG PO TABS: 1.0000 | ORAL_TABLET | Freq: Four times a day (QID) | ORAL | Status: DC | PRN

## 2014-03-09 NOTE — Progress Notes (Signed)
Subjective:    Patient ID: James Kim, male    DOB: 1975-06-20, 38 y.o.   MRN: [id number]  HPI: [name] is a 37 year old male who returns for follow up for chronic pain and medication refill. He says his pain is located in his bilateral feet. He rates his pain 7. He has noticed increased intensity of pain in his feet, it has become unbearable. We will increase his hydrocodone tablets at this time. He will see Dr. Riley Kill next visit for input in his treatment regime. He verbalizes understanding. His current exercise regime is walking. Wife in room all questions answered. He's on Peritoneal Dialysis.  Pain Inventory Average Pain 9 Pain Right Now 7 My pain is constant, sharp, stabbing, aching and cramping  In the last 24 hours, has pain interfered with the following? General activity 9 Relation with others 6 Enjoyment of life 10 What TIME of day is your pain at its worst? morning, evening Sleep (in general) Poor  Pain is worse with: walking, sitting, standing and some activites Pain improves with: rest, heat/ice and medication Relief from Meds: 2  Mobility walk without assistance how many minutes can you walk? 20-25 ability to climb steps?  yes do you drive?  yes Do you have any goals in this area?  yes  Function disabled: date disabled 03/04/13 I need assistance with the following:  dressing and bathing Do you have any goals in this area?  yes  Neuro/Psych trouble walking anxiety  Prior Studies Any changes since last visit?  no  Physicians involved in your care Any changes since last visit?  no   Family History  Problem Relation Age of Onset  . Stroke Mother   . Diabetes Father    History   Social History  . Marital Status: Married    Spouse Name: N/A    Number of Children: N/A  . Years of Education: N/A   Social History Main Topics  . Smoking status: Never Smoker   . Smokeless tobacco: Current User  . Alcohol Use: Yes  . Drug Use: No    . Sexual Activity: None   Other Topics Concern  . None   Social History Narrative   History reviewed. No pertinent past surgical history. Past Medical History  Diagnosis Date  . Chronic kidney disease     Born with kidney defect  . Arthritis   . Asthma   . Hypertension    BP 125/55 mmHg  Pulse 74  Resp 14  Ht 5\' 6"  (1.676 m)  Wt 253 lb (114.76 kg)  BMI 40.85 kg/m2  SpO2 97%  Opioid Risk Score:   Fall Risk Score: Low Fall Risk (0-5 points)  Review of Systems     Objective:   Physical Exam  Constitutional: He is oriented to person, place, and time. He appears well-developed and well-nourished.  HENT:  Head: Normocephalic and atraumatic.  Neck: Normal range of motion. Neck supple.  Cardiovascular: Normal rate and regular rhythm.   Pulmonary/Chest: Effort normal and breath sounds normal.  Musculoskeletal:  Normal Muscle Bulk and Muscle testing Reveals: Upper Extremities: Full ROM and Muscle strength 5/5 Lower extremities: Full ROM and Muscle strength 4/5 Lower extremities flexion Produces pain into sole of feet Arises from chair with ease Antalgic Gait  Neurological: He is alert and oriented to person, place, and time.  Skin: Skin is warm and dry.  Psychiatric: He has a normal mood and affect.  Nursing note and vitals  reviewed.         Assessment & Plan:  1.Chronic foot pain with history of plantar fasciitis: Refilled: HYDROcodone 10/325 mg one tablet every 6 hours as needed # increased to #135. May Take an Extra Tablet when Pain is severe, No More Than 5 a day. Second script given due to scheduling next appointment. and Continue Fentanyl 25 mcg one patch every other day #10 . Second Script given. 2.Chronic Kidney Disease stage V:On Peritoneal Dialysis/ Nephrology Following. 3. Anxiety: Continue: Xanax 0.5 mg daily/prn # 20   20 minutes of face to face patient care time was spent during this visit. All questions were encouraged and answered.   .   F/U in 1  month

## 2014-04-14 ENCOUNTER — Encounter: Payer: Self-pay | Admitting: Physical Medicine & Rehabilitation

## 2014-04-14 ENCOUNTER — Encounter
Payer: BLUE CROSS/BLUE SHIELD | Attending: Physical Medicine & Rehabilitation | Admitting: Physical Medicine & Rehabilitation

## 2014-04-14 ENCOUNTER — Other Ambulatory Visit: Payer: Self-pay | Admitting: Physical Medicine & Rehabilitation

## 2014-04-14 VITALS — BP 134/61 | HR 104 | Resp 14 | Ht 66.0 in | Wt 247.0 lb

## 2014-04-14 DIAGNOSIS — M722 Plantar fascial fibromatosis: Secondary | ICD-10-CM | POA: Diagnosis not present

## 2014-04-14 DIAGNOSIS — Z79899 Other long term (current) drug therapy: Secondary | ICD-10-CM | POA: Diagnosis not present

## 2014-04-14 DIAGNOSIS — N185 Chronic kidney disease, stage 5: Secondary | ICD-10-CM | POA: Insufficient documentation

## 2014-04-14 DIAGNOSIS — M1009 Idiopathic gout, multiple sites: Secondary | ICD-10-CM | POA: Diagnosis present

## 2014-04-14 DIAGNOSIS — Z5181 Encounter for therapeutic drug level monitoring: Secondary | ICD-10-CM | POA: Insufficient documentation

## 2014-04-14 DIAGNOSIS — M109 Gout, unspecified: Secondary | ICD-10-CM

## 2014-04-14 MED ORDER — OXYCODONE-ACETAMINOPHEN 10-325 MG PO TABS: 1.0000 | ORAL_TABLET | Freq: Four times a day (QID) | ORAL | Status: DC | PRN

## 2014-04-14 MED ORDER — FENTANYL 25 MCG/HR TD PT72: 25.0000 ug | MEDICATED_PATCH | TRANSDERMAL | Status: DC

## 2014-04-14 NOTE — Progress Notes (Signed)
Subjective:    Patient ID: James Kim, male    DOB: 10-11-1975, 38 y.o.   MRN: [id number]  HPI   James Kim is back regarding his chronic pain related to his gout and RA. He has been up and down on meds for his gout and renal insufficiency. He sees Dr. Felipa Furnace in [place]. He is frustrated that his pain has suffered as a result.   He is now on PD which has been draining on him. He is using a cycler which often can be painful due to the rate of fluid it pulls out.    Pain Inventory Average Pain 9 Pain Right Now 8 My pain is constant, sharp, stabbing and aching  In the last 24 hours, has pain interfered with the following? General activity 9 Relation with others 8 Enjoyment of life 10 What TIME of day is your pain at its worst? morning, evening, night Sleep (in general) Poor  Pain is worse with: walking, standing, some activites and gout Pain improves with: rest, heat/ice and medication Relief from Meds: 4  Mobility walk without assistance how many minutes can you walk? 15-20 ability to climb steps?  yes do you drive?  yes Do you have any goals in this area?  yes  Function not employed: date last employed 03/03/13 disabled: date disabled 03/03/13 Do you have any goals in this area?  yes  Neuro/Psych weakness trouble walking anxiety  Prior Studies Any changes since last visit?  no  Physicians involved in your care Any changes since last visit?  no   Family History  Problem Relation Age of Onset  . Stroke Mother   . Diabetes Father    History   Social History  . Marital Status: Married    Spouse Name: N/A    Number of Children: N/A  . Years of Education: N/A   Social History Main Topics  . Smoking status: Never Smoker   . Smokeless tobacco: Current User  . Alcohol Use: Yes  . Drug Use: No  . Sexual Activity: None   Other Topics Concern  . None   Social History Narrative   History reviewed. No pertinent past surgical history. Past Medical  History  Diagnosis Date  . Chronic kidney disease     Born with kidney defect  . Arthritis   . Asthma   . Hypertension    BP 134/61 mmHg  Pulse 104  Resp 14  Ht 5\' 6"  (1.676 m)  Wt 247 lb (112.038 kg)  BMI 39.89 kg/m2  SpO2 95%  Opioid Risk Score:   Fall Risk Score: Low Fall Risk (0-5 points) (pt has rec'd safety pamphlet during previous visit) Review of Systems  Musculoskeletal: Positive for gait problem.  Neurological: Positive for weakness.  Psychiatric/Behavioral: The patient is nervous/anxious.   All other systems reviewed and are negative.      Objective:   Physical Exam  General: Alert and oriented x 3, No apparent distress. Obese, weight unchanged.  HEENT: Head is normocephalic, atraumatic, PERRLA, EOMI, sclera anicteric, oral mucosa pink and moist, dentition intact, ext ear canals clear,  Neck: Supple without JVD or lymphadenopathy  Heart: Reg rate and rhythm. No murmurs rubs or gallops  Chest: CTA bilaterally without wheezes, rales, or rhonchi; no distress  Abdomen: Soft, non-tender, non-distended, bowel sounds positive.  Extremities: No clubbing, cyanosis, or edema. Pulses are 2+  Skin: Clean and intact without signs of breakdown. He is wearing fentanyl patch on his right foot. He does have  a rash on his face. Neuro: Pt is cognitively appropriate with normal insight, memory, and awareness. Cranial nerves 2-12 are intact. Sensory exam is normal even in the feet which are sensitive to touch due to pain. Reflexes are 2+ in all 4's. Fine motor coordination is intact. No tremors. Motor function is grossly 5/5.   Musculoskeletal:No joint deformities seen in either shoulder, elbow, or hand. No knee swelling or abnormalities are appreciated. Bilateral feet continue to be painful with palpation along both calcanei, metatarsal heads, both MTP's medial arch, and lateral weight bearing area of either feet. No tophi. Mild edema noted. No redness or warmth. There was no evidence  of breakdown. He walks with significant antalgia on either foot, left moreso than right still.    Psych: Pt's affect is appropriate. Pt is cooperative. He is a little flat but pleasant     ASSESSMENT:  1. Chronic foot pain with history of plantar fasciitis  2. Gouty arthritis primarily affecting the feet  3. ?Rheumatoid arthritis primarily affecting the feet  4. 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 Obesity.   Plan:  1. Uloric held per nephrology. Now on colchicine.  2.Consider fentanyl patch increase  3. Continue voltaren gel for topical relief.  4. Will try percocet for breakthrough pain in place of hydrocodone, 10/325 one q6 prn #120 5. He needs to follow up rheumatology regarding recs for more effectively treating his gout and to work with his nephrology team to find meds he can tolerate.  6. 25 minutes of face to face patient care time were spent during this visit. All questions were encouraged and answered. I will see him back in about a month.

## 2014-04-14 NOTE — Patient Instructions (Signed)
Make sure you wear your fentanyl patch on soft areas where it can be absorbed into fat or soft tissue   Follow up with your rheumatology team regarding options to treat you gout in the setting of your kidney failure

## 2014-05-08 DIAGNOSIS — N186 End stage renal disease: Secondary | ICD-10-CM | POA: Diagnosis not present

## 2014-05-08 DIAGNOSIS — Z992 Dependence on renal dialysis: Secondary | ICD-10-CM | POA: Diagnosis not present

## 2014-05-11 ENCOUNTER — Encounter: Payer: Self-pay | Admitting: Registered Nurse

## 2014-05-11 ENCOUNTER — Encounter: Payer: Medicare Other | Attending: Physical Medicine & Rehabilitation | Admitting: Registered Nurse

## 2014-05-11 VITALS — BP 151/77 | HR 105 | Resp 14

## 2014-05-11 DIAGNOSIS — Z79899 Other long term (current) drug therapy: Secondary | ICD-10-CM | POA: Diagnosis not present

## 2014-05-11 DIAGNOSIS — M1009 Idiopathic gout, multiple sites: Secondary | ICD-10-CM | POA: Diagnosis not present

## 2014-05-11 DIAGNOSIS — M722 Plantar fascial fibromatosis: Secondary | ICD-10-CM

## 2014-05-11 DIAGNOSIS — Z5181 Encounter for therapeutic drug level monitoring: Secondary | ICD-10-CM

## 2014-05-11 DIAGNOSIS — N185 Chronic kidney disease, stage 5: Secondary | ICD-10-CM

## 2014-05-11 DIAGNOSIS — M109 Gout, unspecified: Secondary | ICD-10-CM

## 2014-05-11 MED ORDER — FENTANYL 25 MCG/HR TD PT72: 25.0000 ug | MEDICATED_PATCH | TRANSDERMAL | Status: DC

## 2014-05-11 MED ORDER — OXYCODONE-ACETAMINOPHEN 10-325 MG PO TABS: 1.0000 | ORAL_TABLET | Freq: Four times a day (QID) | ORAL | Status: DC | PRN

## 2014-05-11 NOTE — Progress Notes (Signed)
Subjective:    Patient ID: James Kim, male    DOB: 1975-05-29, 39 y.o.   MRN: [id number]  HPI: [name] is a 39 year old male who returns for follow up for chronic pain and medication refill. He says his pain is located in his bilateral heels of feet. He rates his pain 3. His current exercise regime is walking.  He's having financial hardship instructed to speak with his Social worker at the dialysis unit and pharmacy to see if they would excuse his co-pay until his medicare is straighten out. He verbalizes understanding.  Blood pressure re-checked 153/91 heart rate 97.  Pain Inventory Average Pain 8 Pain Right Now 3 My pain is constant  In the last 24 hours, has pain interfered with the following? General activity 8 Relation with others 8 Enjoyment of life 10 What TIME of day is your pain at its worst? morning,evening Sleep (in general) Poor  Pain is worse with: standing and some activites Pain improves with: rest, heat/ice and medication Relief from Meds: 7  Mobility walk without assistance how many minutes can you walk? 20 ability to climb steps?  yes do you drive?  yes Do you have any goals in this area?  yes  Function disabled: date disabled . Do you have any goals in this area?  yes  Neuro/Psych trouble walking  Prior Studies Any changes since last visit?  no  Physicians involved in your care Any changes since last visit?  no   Family History  Problem Relation Age of Onset  . Stroke Mother   . Diabetes Father    History   Social History  . Marital Status: Married    Spouse Name: N/A    Number of Children: N/A  . Years of Education: N/A   Social History Main Topics  . Smoking status: Never Smoker   . Smokeless tobacco: Current User  . Alcohol Use: Yes  . Drug Use: No  . Sexual Activity: None   Other Topics Concern  . None   Social History Narrative   History reviewed. No pertinent past surgical history. Past Medical  History  Diagnosis Date  . Chronic kidney disease     Born with kidney defect  . Arthritis   . Asthma   . Hypertension    BP 151/77 mmHg  Pulse 105  Resp 14  SpO2 100%  Opioid Risk Score:   Fall Risk Score:   Review of Systems  Constitutional: Positive for appetite change.       Poor appetite  Respiratory: Positive for apnea and shortness of breath.   Musculoskeletal: Positive for gait problem.  Psychiatric/Behavioral: The patient is nervous/anxious.   All other systems reviewed and are negative.      Objective:   Physical Exam  Constitutional: He is oriented to person, place, and time. He appears well-developed and well-nourished.  HENT:  Head: Normocephalic and atraumatic.  Neck: Normal range of motion. Neck supple.  Cardiovascular: Normal rate and regular rhythm.   Pulmonary/Chest: Effort normal and breath sounds normal.  Musculoskeletal:  Normal Muscle Bulk and Muscle Testing Reveals: Upper Extremities: Full ROM and Muscle strength 5/5 Lower extremities: Full ROM and Muscle strength 5/5 Bilateral Lower extremities Flexion Produces pain into Calcaneous Arises from chair with ease Narrow based Gait  Neurological: He is alert and oriented to person, place, and time.  Skin: Skin is warm and dry.  Psychiatric: He has a normal mood and affect.  Nursing note  and vitals reviewed.         Assessment & Plan:  1.Chronic foot pain with history of plantar fasciitis: Refilled: Oxycodone 10/325 mg one tablet every 6 hours as needed #120 and Fentanyl 25 mcg one patch every three days #10. 2.Chronic Kidney Disease stage V:On Peritoneal Dialysis/ Nephrology Following. 3. Anxiety: Continue: Xanax 0.5 mg daily/prn   20 minutes of face to face patient care time was spent during this visit. All questions were encouraged and answered.  .  F/U in 1 month

## 2014-06-08 ENCOUNTER — Encounter: Payer: BLUE CROSS/BLUE SHIELD | Attending: Physical Medicine & Rehabilitation | Admitting: Registered Nurse

## 2014-06-08 ENCOUNTER — Encounter: Payer: Self-pay | Admitting: Registered Nurse

## 2014-06-08 VITALS — BP 149/90 | HR 112

## 2014-06-08 DIAGNOSIS — M1009 Idiopathic gout, multiple sites: Secondary | ICD-10-CM | POA: Insufficient documentation

## 2014-06-08 DIAGNOSIS — N185 Chronic kidney disease, stage 5: Secondary | ICD-10-CM | POA: Insufficient documentation

## 2014-06-08 DIAGNOSIS — Z79899 Other long term (current) drug therapy: Secondary | ICD-10-CM | POA: Diagnosis not present

## 2014-06-08 DIAGNOSIS — Z5181 Encounter for therapeutic drug level monitoring: Secondary | ICD-10-CM | POA: Insufficient documentation

## 2014-06-08 DIAGNOSIS — Z992 Dependence on renal dialysis: Secondary | ICD-10-CM | POA: Diagnosis not present

## 2014-06-08 DIAGNOSIS — N186 End stage renal disease: Secondary | ICD-10-CM | POA: Diagnosis not present

## 2014-06-08 DIAGNOSIS — M722 Plantar fascial fibromatosis: Secondary | ICD-10-CM | POA: Insufficient documentation

## 2014-06-08 MED ORDER — FENTANYL 25 MCG/HR TD PT72: 25.0000 ug | MEDICATED_PATCH | TRANSDERMAL | Status: DC

## 2014-06-08 MED ORDER — ALPRAZOLAM 0.5 MG PO TABS: 0.5000 mg | ORAL_TABLET | Freq: Two times a day (BID) | ORAL | Status: DC | PRN

## 2014-06-08 MED ORDER — OXYCODONE-ACETAMINOPHEN 10-325 MG PO TABS: 1.0000 | ORAL_TABLET | Freq: Four times a day (QID) | ORAL | Status: DC | PRN

## 2014-06-08 NOTE — Progress Notes (Signed)
Subjective:    Patient ID: James Kim, male    DOB: 1975-07-10, 39 y.o.   MRN: [id number]  HPI: [name] is a 39 year old male who returns for follow up for chronic pain and medication refill. He says his pain is located in his bilateral heels of feet. He rates his pain 4. His current exercise regime is walking.  He's still having financial hardship the Social worker at the dialysis unit trying to help. Encouraged to reach out to the [employer] given. He verbalizes understanding.  Arrived tachycardic 112, pulse re-checked 104. He brought back January script Fentanyl patches, script discarded. New script printed.  Pain Inventory Average Pain 8 Pain Right Now 4 My pain is sharp, stabbing and aching  In the last 24 hours, has pain interfered with the following? General activity 7 Relation with others 5 Enjoyment of life 8 What TIME of day is your pain at its worst? morning and evening Sleep (in general) Poor  Pain is worse with: walking, standing and some activites Pain improves with: rest, heat/ice and medication Relief from Meds: 7  Mobility walk without assistance how many minutes can you walk? 20 ability to climb steps?  yes do you drive?  yes  Function disabled: date disabled 03/03/13 I need assistance with the following:  household duties  Neuro/Psych weakness trouble walking anxiety  Prior Studies Any changes since last visit?  no  Physicians involved in your care Any changes since last visit?  no   Family History  Problem Relation Age of Onset  . Stroke Mother   . Diabetes Father    History   Social History  . Marital Status: Married    Spouse Name: N/A    Number of Children: N/A  . Years of Education: N/A   Social History Main Topics  . Smoking status: Never Smoker   . Smokeless tobacco: Current User  . Alcohol Use: Yes  . Drug Use: No  . Sexual Activity: None   Other Topics Concern  . None   Social  History Narrative   History reviewed. No pertinent past surgical history. Past Medical History  Diagnosis Date  . Chronic kidney disease     Born with kidney defect  . Arthritis   . Asthma   . Hypertension    There were no vitals taken for this visit.  Opioid Risk Score:   Fall Risk Score:  (previously educated and given handout)  Review of Systems  Constitutional: Positive for appetite change.  Respiratory: Positive for apnea and shortness of breath.   Cardiovascular: Positive for leg swelling.  Gastrointestinal: Positive for nausea.  Musculoskeletal: Positive for gait problem.  Neurological: Positive for weakness.  Psychiatric/Behavioral: The patient is nervous/anxious.   All other systems reviewed and are negative.      Objective:   Physical Exam  Constitutional: He is oriented to person, place, and time. He appears well-developed and well-nourished.  HENT:  Head: Normocephalic and atraumatic.  Neck: Normal range of motion. Neck supple.  Cardiovascular: Normal rate and regular rhythm.   Pulmonary/Chest: Effort normal and breath sounds normal.  Musculoskeletal:  Normal Muscle Bulk and Muscle Testing Reveals: Upper Extremities: Full ROM and Muscle Strength 5/5 Lower Extremities: Full ROM and Muscle Strength 5/5 Bilateral Plantar Heel Tenderness with Palpation Arises from chair with ease Antalgic Gait    Neurological: He is alert and oriented to person, place, and time.  Skin: Skin is warm and dry.  Psychiatric: He has a normal mood and affect.  Nursing note and vitals reviewed.         Assessment & Plan:  1.Chronic foot pain with history of plantar fasciitis: Refilled: Oxycodone 10/325 mg one tablet every 6 hours as needed #120 and Fentanyl 25 mcg one patch every three days #10. 2.Chronic Kidney Disease stage V:On Peritoneal Dialysis/ Nephrology Following. 3. Anxiety: Increased: Xanax 0.5 mg BID/prn   20 minutes of face to face patient care time was spent  during this visit. All questions were encouraged and answered.  .  F/U in 1 month

## 2014-07-07 ENCOUNTER — Encounter: Payer: Self-pay | Admitting: Registered Nurse

## 2014-07-07 ENCOUNTER — Encounter: Payer: BLUE CROSS/BLUE SHIELD | Attending: Physical Medicine & Rehabilitation | Admitting: Registered Nurse

## 2014-07-07 ENCOUNTER — Other Ambulatory Visit: Payer: Self-pay | Admitting: Registered Nurse

## 2014-07-07 VITALS — BP 126/76 | HR 90 | Resp 14

## 2014-07-07 DIAGNOSIS — N185 Chronic kidney disease, stage 5: Secondary | ICD-10-CM | POA: Diagnosis not present

## 2014-07-07 DIAGNOSIS — Z5181 Encounter for therapeutic drug level monitoring: Secondary | ICD-10-CM | POA: Diagnosis not present

## 2014-07-07 DIAGNOSIS — Z79899 Other long term (current) drug therapy: Secondary | ICD-10-CM | POA: Diagnosis not present

## 2014-07-07 DIAGNOSIS — M1009 Idiopathic gout, multiple sites: Secondary | ICD-10-CM | POA: Diagnosis not present

## 2014-07-07 DIAGNOSIS — M722 Plantar fascial fibromatosis: Secondary | ICD-10-CM | POA: Diagnosis not present

## 2014-07-07 MED ORDER — OXYCODONE-ACETAMINOPHEN 10-325 MG PO TABS: 1.0000 | ORAL_TABLET | Freq: Four times a day (QID) | ORAL | Status: DC | PRN

## 2014-07-07 MED ORDER — FENTANYL 25 MCG/HR TD PT72: 25.0000 ug | MEDICATED_PATCH | TRANSDERMAL | Status: DC

## 2014-07-07 NOTE — Progress Notes (Signed)
Subjective:    Patient ID: James Kim, male    DOB: 08/03/1975, 39 y.o.   MRN: [id number]  HPI: [name] is a 39 year old male who returns for follow up for chronic pain and medication refill. He says his pain is located in his bilateral heels of feet. He rates his pain 5. His current exercise regime is walking.   Pain Inventory Average Pain 8 Pain Right Now 5 My pain is constant, sharp, stabbing and aching  In the last 24 hours, has pain interfered with the following? General activity 5 Relation with others 7 Enjoyment of life 8 What TIME of day is your pain at its worst? morning and evening Sleep (in general) Poor  Pain is worse with: walking, bending and standing Pain improves with: rest and heat/ice Relief from Meds: 7  Mobility walk without assistance how many minutes can you walk? 20 ability to climb steps?  yes do you drive?  yes  Function disabled: date disabled .  Neuro/Psych weakness numbness trouble walking anxiety  Prior Studies Any changes since last visit?  no  Physicians involved in your care Any changes since last visit?  no   Family History  Problem Relation Age of Onset  . Stroke Mother   . Diabetes Father    History   Social History  . Marital Status: Married    Spouse Name: N/A  . Number of Children: N/A  . Years of Education: N/A   Social History Main Topics  . Smoking status: Never Smoker   . Smokeless tobacco: Current User  . Alcohol Use: Yes  . Drug Use: No  . Sexual Activity: Not on file   Other Topics Concern  . None   Social History Narrative   History reviewed. No pertinent past surgical history. Past Medical History  Diagnosis Date  . Chronic kidney disease     Born with kidney defect  . Arthritis   . Asthma   . Hypertension    Pulse 90  Resp 14  SpO2 100%  Opioid Risk Score:   Fall Risk Score: Low Fall Risk (0-5 points)  Review of Systems  Constitutional: Positive for appetite  change.  HENT: Negative.   Eyes: Negative.   Respiratory: Positive for apnea and shortness of breath.   Cardiovascular: Negative.   Gastrointestinal: Positive for nausea and vomiting.  Endocrine: Negative.   Genitourinary: Negative.   Musculoskeletal:       Foot pain, gout, RA  Allergic/Immunologic: Negative.   Neurological: Positive for weakness.       Trouble walking  Hematological: Negative.   Psychiatric/Behavioral: The patient is nervous/anxious.        Objective:   Physical Exam  Constitutional: He is oriented to person, place, and time. He appears well-developed and well-nourished.  HENT:  Head: Normocephalic and atraumatic.  Neck: Normal range of motion. Neck supple.  Cardiovascular: Normal rate and regular rhythm.   Pulmonary/Chest: Effort normal and breath sounds normal.  Musculoskeletal:  Normal Muscle Bulk and Muscle Strength 5/5 Upper Extremities: Full ROM and Muscle Strength 5/5 Lower Extremities: Full ROM and Muscle Strength 5/5 Bilateral heels tender to palpation Arises from chair with ease Wide based/antalgic gait  Neurological: He is alert and oriented to person, place, and time.  Skin: Skin is warm and dry.  Psychiatric: He has a normal mood and affect.  Nursing note and vitals reviewed.         Assessment & Plan:  1.Chronic foot  pain with history of plantar fasciitis: Refilled: Oxycodone 10/325 mg one tablet every 6 hours as needed #120 second script given to accomadate appointment and Fentanyl 25 mcg one patch every three days #10. Second Fentanyl script discarded. 2.Chronic Kidney Disease stage V:On Peritoneal Dialysis/ Nephrology Following. 3. Anxiety: Continue Xanax 0.5 mg BID/prn   20 minutes of face to face patient care time was spent during this visit. All questions were encouraged and answered.  .  F/U in 1 month

## 2014-07-08 LAB — PMP ALCOHOL METABOLITE (ETG)

## 2014-07-14 LAB — BENZODIAZEPINES (GC/LC/MS), URINE
ALPRAZOLAMU: 210 ng/mL (ref <25)
Clonazepam metabolite (GC/LC/MS), ur confirm: NEGATIVE ng/mL (ref <25)
Flurazepam metabolite (GC/LC/MS), ur confirm: NEGATIVE ng/mL (ref <50)
LORAZEPAMU: NEGATIVE ng/mL (ref <50)
MIDAZOLAMU: NEGATIVE ng/mL (ref <50)
Nordiazepam (GC/LC/MS), ur confirm: NEGATIVE ng/mL (ref <50)
Oxazepam (GC/LC/MS), ur confirm: NEGATIVE ng/mL (ref <50)
Temazepam (GC/LC/MS), ur confirm: NEGATIVE ng/mL (ref <50)
Triazolam metabolite (GC/LC/MS), ur confirm: NEGATIVE ng/mL (ref <50)

## 2014-07-14 LAB — OPIATES/OPIOIDS (LC/MS-MS)
CODEINE URINE: NEGATIVE ng/mL (ref <50)
HYDROCODONE: NEGATIVE ng/mL (ref <50)
HYDROMORPHONE: NEGATIVE ng/mL (ref <50)
Morphine Urine: NEGATIVE ng/mL (ref <50)
NORHYDROCODONE, UR: NEGATIVE ng/mL (ref <50)
Noroxycodone, Ur: 515 ng/mL (ref <50)
Oxycodone, ur: 614 ng/mL (ref <50)
Oxymorphone: 1898 ng/mL (ref <50)

## 2014-07-14 LAB — OXYCODONE, URINE (LC/MS-MS)
NOROXYCODONE, UR: 515 ng/mL (ref <50)
OXYMORPHONE, URINE: 1898 ng/mL (ref <50)
Oxycodone, ur: 614 ng/mL (ref <50)

## 2014-07-14 LAB — ETHYL GLUCURONIDE, URINE
Ethyl Glucuronide (EtG): NEGATIVE ng/mL (ref <500)
Ethyl Sulfate (ETS): 720 ng/mL — ABNORMAL HIGH (ref <100)

## 2014-07-14 LAB — FENTANYL (GC/LC/MS), URINE
Fentanyl, confirm: 15.3 ng/mL (ref <0.5)
NORFENTANYL (GC/MS) CONFIRM: 43.6 ng/mL (ref <0.5)

## 2014-07-16 LAB — PRESCRIPTION MONITORING PROFILE (SOLSTAS)
Amphetamine/Meth: NEGATIVE ng/mL
Barbiturate Screen, Urine: NEGATIVE ng/mL
Buprenorphine, Urine: NEGATIVE ng/mL
CREATININE, URINE: 191.93 mg/dL (ref >20.0)
Cannabinoid Scrn, Ur: NEGATIVE ng/mL
Carisoprodol, Urine: NEGATIVE ng/mL
Cocaine Metabolites: NEGATIVE ng/mL
MDMA URINE: NEGATIVE ng/mL
Meperidine, Ur: NEGATIVE ng/mL
Methadone Screen, Urine: NEGATIVE ng/mL
NITRITES URINE, INITIAL: NEGATIVE ug/mL
PH URINE, INITIAL: 5.5 pH (ref 4.5–8.9)
PROPOXYPHENE: NEGATIVE ng/mL
TAPENTADOLUR: NEGATIVE ng/mL
TRAMADOL UR: NEGATIVE ng/mL
Zolpidem, Urine: NEGATIVE ng/mL

## 2014-07-24 NOTE — Progress Notes (Signed)
Urine drug screen for this encounter is consistent for prescribed medication 

## 2014-08-10 ENCOUNTER — Encounter: Payer: Self-pay | Admitting: Registered Nurse

## 2014-08-10 ENCOUNTER — Encounter: Payer: BLUE CROSS/BLUE SHIELD | Attending: Physical Medicine & Rehabilitation | Admitting: Registered Nurse

## 2014-08-10 VITALS — BP 131/67 | HR 79 | Resp 14

## 2014-08-10 DIAGNOSIS — Z5181 Encounter for therapeutic drug level monitoring: Secondary | ICD-10-CM | POA: Insufficient documentation

## 2014-08-10 DIAGNOSIS — N185 Chronic kidney disease, stage 5: Secondary | ICD-10-CM | POA: Insufficient documentation

## 2014-08-10 DIAGNOSIS — Z79899 Other long term (current) drug therapy: Secondary | ICD-10-CM | POA: Diagnosis not present

## 2014-08-10 DIAGNOSIS — M722 Plantar fascial fibromatosis: Secondary | ICD-10-CM | POA: Diagnosis not present

## 2014-08-10 DIAGNOSIS — M1009 Idiopathic gout, multiple sites: Secondary | ICD-10-CM | POA: Insufficient documentation

## 2014-08-10 MED ORDER — FENTANYL 25 MCG/HR TD PT72: 25.0000 ug | MEDICATED_PATCH | TRANSDERMAL | Status: DC

## 2014-08-10 MED ORDER — ALPRAZOLAM 0.5 MG PO TABS: 0.5000 mg | ORAL_TABLET | Freq: Two times a day (BID) | ORAL | Status: DC | PRN

## 2014-08-10 MED ORDER — OXYCODONE-ACETAMINOPHEN 10-325 MG PO TABS: 1.0000 | ORAL_TABLET | Freq: Four times a day (QID) | ORAL | Status: DC | PRN

## 2014-08-10 MED ORDER — METHYLPREDNISOLONE 4 MG PO KIT: PACK | ORAL | Status: DC

## 2014-08-10 NOTE — Progress Notes (Signed)
Subjective:    Patient ID: James Kim, male    DOB: [birth year], 39 y.o.   MRN: [id number]  HPI: Mr. JAVARIOUS ELSAYED is a 39 year old male who returns for follow up for chronic pain and medication refill. He says his pain is located in his bilateral heels and bilateral feet R> L. He rates his pain 6. His current exercise regime is walking. They joined [employer] he's attending twice a week, walking on treadmill 5- 10 minutes and using the stationary bicycle 5-10 minutes. PHQ-9 score 15 he admits being depressed, denies suicidal thoughts and plan. Also states depression related to being out on disability, pain and starting on Peritoneal Dialysis. He's states he's trying to process the various life changes he had to face in the last few months. He will be seeing a counselor soon with transplant placement. Encouraged to address his depression he verbalizes understanding.   Pain Inventory Average Pain 8 Pain Right Now 6 My pain is sharp, stabbing and aching  In the last 24 hours, has pain interfered with the following? General activity 8 Relation with others 6 Enjoyment of life 10 What TIME of day is your pain at its worst? morning and night Sleep (in general) Poor  Pain is worse with: walking, standing and some activites Pain improves with: rest, heat/ice, medication and injections Relief from Meds: 6  Mobility walk without assistance how many minutes can you walk? 20 ability to climb steps?  yes do you drive?  yes  Function disabled: date disabled . I need assistance with the following:  household duties  Neuro/Psych trouble walking anxiety  Prior Studies Any changes since last visit?  no  Physicians involved in your care Any changes since last visit?  no   Family History  Problem Relation Age of Onset  . Stroke Mother   . Diabetes Father    History   Social History  . Marital Status: Married    Spouse Name: N/A  . Number of Children: N/A  . Years of  Education: N/A   Social History Main Topics  . Smoking status: Never Smoker   . Smokeless tobacco: Current User  . Alcohol Use: Yes  . Drug Use: No  . Sexual Activity: Not on file   Other Topics Concern  . None   Social History Narrative   History reviewed. No pertinent past surgical history. Past Medical History  Diagnosis Date  . Chronic kidney disease     Born with kidney defect  . Arthritis   . Asthma   . Hypertension    BP 131/67 mmHg  Pulse 79  Resp 14  SpO2 100%  Opioid Risk Score:   Fall Risk Score: Low Fall Risk (0-5 points) (previously educated and given handout)`1  Depression screen PHQ 2/9  Depression screen PHQ 2/9 08/10/2014  Decreased Interest 2  Down, Depressed, Hopeless 1  PHQ - 2 Score 3  Altered sleeping 3  Tired, decreased energy 3  Change in appetite 3  Feeling bad or failure about yourself  1  Trouble concentrating 2  Moving slowly or fidgety/restless 0  Suicidal thoughts 0  PHQ-9 Score 15    Review of Systems  Constitutional: Positive for appetite change.  Respiratory: Positive for apnea and shortness of breath.   Gastrointestinal: Positive for nausea.  Musculoskeletal: Positive for gait problem.  Psychiatric/Behavioral: The patient is nervous/anxious.   All other systems reviewed and are negative.      Objective:   Physical  Exam  Constitutional: He is oriented to person, place, and time. He appears well-developed and well-nourished.  HENT:  Head: Normocephalic and atraumatic.  Neck: Normal range of motion. Neck supple.  Cardiovascular: Normal rate and regular rhythm.   Pulmonary/Chest: Effort normal and breath sounds normal.  Abdominal:  Tenckhoff Catheter intact  Musculoskeletal:  Normal Muscle Bulk and Muscle Testing Reveals: Upper Extremities: Full ROM and Muscle Strength 5/5 Lower Extremities: Full ROM and Muscle Strength 4/5 Right Anterior Aspect with Tenderness with Palpation. Right Lateral Malleolus Swelling and  tenderness with Palpation Bilateral heel tenderness Arises from chair with ease Antalgic Gait  Neurological: He is alert and oriented to person, place, and time.  Skin: Skin is warm and dry.  Psychiatric: He has a normal mood and affect.  Nursing note and vitals reviewed.         Assessment & Plan:  1.Chronic foot pain with history of plantar fasciitis: Refilled: Oxycodone 10/325 mg one tablet every 6 hours as needed #120 and Fentanyl 25 mcg one patch every three days #10.  RX: Medrol Dose Pak 2.Chronic Kidney Disease stage V:On Peritoneal Dialysis/ Nephrology Following. 3. Anxiety: Continue Xanax 0.5 mg BID/prn   20 minutes of face to face patient care time was spent during this visit. All questions were encouraged and answered.  .  F/U in 1 month

## 2014-08-19 ENCOUNTER — Other Ambulatory Visit: Payer: Self-pay | Admitting: Physical Medicine & Rehabilitation

## 2014-09-22 ENCOUNTER — Encounter: Payer: Medicare Other | Admitting: Registered Nurse

## 2014-10-01 ENCOUNTER — Encounter (HOSPITAL_COMMUNITY): Payer: Self-pay

## 2014-10-01 ENCOUNTER — Emergency Department (HOSPITAL_COMMUNITY)
Admission: EM | Admit: 2014-10-01 | Discharge: 2014-10-02 | Disposition: A | Discharge status: Home / Self Care | Payer: BLUE CROSS/BLUE SHIELD | Attending: Emergency Medicine | Admitting: Emergency Medicine

## 2014-10-01 DIAGNOSIS — N186 End stage renal disease: Secondary | ICD-10-CM | POA: Diagnosis not present

## 2014-10-01 DIAGNOSIS — M1990 Unspecified osteoarthritis, unspecified site: Secondary | ICD-10-CM | POA: Insufficient documentation

## 2014-10-01 DIAGNOSIS — Z207 Contact with and (suspected) exposure to pediculosis, acariasis and other infestations: Secondary | ICD-10-CM | POA: Insufficient documentation

## 2014-10-01 DIAGNOSIS — I120 Hypertensive chronic kidney disease with stage 5 chronic kidney disease or end stage renal disease: Secondary | ICD-10-CM | POA: Diagnosis not present

## 2014-10-01 DIAGNOSIS — Z79899 Other long term (current) drug therapy: Secondary | ICD-10-CM | POA: Diagnosis not present

## 2014-10-01 DIAGNOSIS — J45909 Unspecified asthma, uncomplicated: Secondary | ICD-10-CM | POA: Insufficient documentation

## 2014-10-01 DIAGNOSIS — Z2089 Contact with and (suspected) exposure to other communicable diseases: Secondary | ICD-10-CM

## 2014-10-01 DIAGNOSIS — Z7952 Long term (current) use of systemic steroids: Secondary | ICD-10-CM | POA: Insufficient documentation

## 2014-10-01 DIAGNOSIS — R21 Rash and other nonspecific skin eruption: Secondary | ICD-10-CM | POA: Diagnosis present

## 2014-10-01 DIAGNOSIS — Z992 Dependence on renal dialysis: Secondary | ICD-10-CM | POA: Diagnosis not present

## 2014-10-01 NOTE — ED Notes (Signed)
Pt concerned that he has scabies due to exposure to his brother who had it.  Pt states he has had itching mostly on his torso for about a month.

## 2014-10-01 NOTE — ED Provider Notes (Signed)
CSN: [id number]     Arrival date & time 10/01/14  2320 History   First MD Initiated Contact with Patient 10/01/14 2343     No chief complaint on file.    (Consider location/radiation/quality/duration/timing/severity/associated sxs/prior Treatment) HPI  James Kim is a 39 y.o. male on peritoneal dialysis secondary to chronic kidney disease presents to the Emergency Department complaining of persistent rash for one month.  He states that his brother has been staying at his house and was recently diagnosed with scabies.  He complains of severe itching.  He denies fever, swelling, recent tick bite, or sore throat.  He states that his recent blood work was "normal" including liver studies and phosphorous.     Past Medical History  Diagnosis Date  . Chronic kidney disease     Born with kidney defect  . Arthritis   . Asthma   . Hypertension    History reviewed. No pertinent past surgical history. Family History  Problem Relation Age of Onset  . Stroke Mother   . Diabetes Father    History  Substance Use Topics  . Smoking status: Never Smoker   . Smokeless tobacco: Current User  . Alcohol Use: Yes    Review of Systems  Constitutional: Negative for fever, chills, activity change and appetite change.  HENT: Negative for facial swelling, sore throat and trouble swallowing.   Respiratory: Negative for chest tightness, shortness of breath and wheezing.   Musculoskeletal: Negative for neck pain and neck stiffness.  Skin: Positive for rash. Negative for wound.  Neurological: Negative for dizziness, weakness, numbness and headaches.  All other systems reviewed and are negative.     Allergies  Sulfa antibiotics and Sulfur  Home Medications   Prior to Admission medications   Medication Sig Start Date End Date Taking? Authorizing Provider  ALPRAZolam Prudy Feeler) 0.5 MG tablet Take 1 tablet (0.5 mg total) by mouth 2 (two) times daily as needed for anxiety. 08/10/14  Yes Jones Bales, NP  amLODipine (NORVASC) 10 MG tablet Take 10 mg by mouth daily.   Yes Historical Provider, MD  escitalopram (LEXAPRO) 10 MG tablet Take 10 mg by mouth at bedtime.   Yes Historical Provider, MD  fentaNYL (DURAGESIC - DOSED MCG/HR) 25 MCG/HR patch Place 1 patch (25 mcg total) onto the skin every 3 (three) days. 08/10/14  Yes Jones Bales, NP  furosemide (LASIX) 20 MG tablet Take 20 mg by mouth.   Yes Historical Provider, MD  methylPREDNISolone (MEDROL DOSEPAK) 4 MG tablet follow package directions 08/10/14  Yes Jones Bales, NP  metoprolol (LOPRESSOR) 100 MG tablet Take 100 mg by mouth.   Yes Historical Provider, MD  oxyCODONE-acetaminophen (PERCOCET) 10-325 MG per tablet Take 1 tablet by mouth every 6 (six) hours as needed for pain. 08/10/14  Yes Jones Bales, NP  pantoprazole (PROTONIX) 40 MG tablet Take 40 mg by mouth daily before breakfast.   Yes Historical Provider, MD  traZODone (DESYREL) 50 MG tablet Take 50 mg by mouth 2 (two) times daily at 10 AM and 5 PM. At bedtime   Yes Historical Provider, MD  Vitamin D, Ergocalciferol, (DRISDOL) 50000 UNITS CAPS capsule Take 50,000 Units by mouth every 7 (seven) days.   Yes Historical Provider, MD  atorvastatin (LIPITOR) 10 MG tablet Take 10 mg by mouth at bedtime.    Historical Provider, MD  calcium carbonate (OS-CAL) 600 MG TABS tablet Take 600 mg by mouth.    Historical Provider, MD  minoxidil (LONITEN)  2.5 MG tablet Take 5 mg by mouth daily.    Historical Provider, MD  predniSONE (DELTASONE) 5 MG tablet TAKE ONE TABLET BY MOUTH ONCE A DAY 08/19/14   Ranelle Oyster, MD  vitamin C (ASCORBIC ACID) 500 MG tablet Take 500 mg by mouth daily.    Historical Provider, MD   BP 153/79 mmHg  Pulse 82  Temp(Src) 99 F (37.2 C) (Oral)  Resp 20  SpO2 100% Physical Exam  Constitutional: He is oriented to person, place, and time. He appears well-developed and well-nourished. No distress.  HENT:  Head: Normocephalic and atraumatic.   Mouth/Throat: Oropharynx is clear and moist.  Neck: Normal range of motion. Neck supple.  Cardiovascular: Normal rate, regular rhythm, normal heart sounds and intact distal pulses.   No murmur heard. Pulmonary/Chest: Effort normal and breath sounds normal. No respiratory distress.  Musculoskeletal: He exhibits no edema or tenderness.  Lymphadenopathy:    He has no cervical adenopathy.  Neurological: He is alert and oriented to person, place, and time. He exhibits normal muscle tone. Coordination normal.  Skin: Skin is warm. Rash noted. There is erythema.  Scattered, pin point erythematous papules to the trunk, dorsal hands, groin, bilateral upper and lower extremities.    Nursing note and vitals reviewed.   ED Course  Procedures (including critical care time) Labs Review Labs Reviewed - No data to display  Imaging Review No results found.   EKG Interpretation None      MDM   Final diagnoses:  Scabies exposure    Pt is well appearing.  Vitals stable.  Scattered , small papules with recent scabies exposure.  Pt agrees to permethrin cream, vistaril for itching and close f/u with dermatology if rash is not improving.     Pauline Aus, PA-C 10/02/14 0032  Gilda Crease, MD 10/03/14 2023

## 2014-10-02 MED ORDER — PERMETHRIN 5 % EX CREA: TOPICAL_CREAM | CUTANEOUS | Status: DC

## 2014-10-02 MED ORDER — HYDROXYZINE HCL 25 MG PO TABS: 25.0000 mg | ORAL_TABLET | Freq: Four times a day (QID) | ORAL | Status: DC

## 2014-10-07 ENCOUNTER — Encounter: Payer: Self-pay | Admitting: Physical Medicine & Rehabilitation

## 2014-10-07 ENCOUNTER — Other Ambulatory Visit: Payer: Self-pay | Admitting: Physical Medicine & Rehabilitation

## 2014-10-07 ENCOUNTER — Encounter: Payer: Medicare Other | Attending: Physical Medicine & Rehabilitation | Admitting: Physical Medicine & Rehabilitation

## 2014-10-07 VITALS — BP 128/72 | HR 80 | Resp 14

## 2014-10-07 DIAGNOSIS — M722 Plantar fascial fibromatosis: Secondary | ICD-10-CM | POA: Diagnosis present

## 2014-10-07 DIAGNOSIS — M1009 Idiopathic gout, multiple sites: Secondary | ICD-10-CM | POA: Insufficient documentation

## 2014-10-07 DIAGNOSIS — N185 Chronic kidney disease, stage 5: Secondary | ICD-10-CM | POA: Insufficient documentation

## 2014-10-07 DIAGNOSIS — Z79899 Other long term (current) drug therapy: Secondary | ICD-10-CM | POA: Insufficient documentation

## 2014-10-07 DIAGNOSIS — G894 Chronic pain syndrome: Secondary | ICD-10-CM

## 2014-10-07 DIAGNOSIS — Z5181 Encounter for therapeutic drug level monitoring: Secondary | ICD-10-CM | POA: Diagnosis present

## 2014-10-07 DIAGNOSIS — M109 Gout, unspecified: Secondary | ICD-10-CM

## 2014-10-07 MED ORDER — FENTANYL 25 MCG/HR TD PT72: 25.0000 ug | MEDICATED_PATCH | TRANSDERMAL | Status: DC

## 2014-10-07 MED ORDER — OXYCODONE-ACETAMINOPHEN 10-325 MG PO TABS: 1.0000 | ORAL_TABLET | Freq: Four times a day (QID) | ORAL | Status: DC | PRN

## 2014-10-07 NOTE — Patient Instructions (Addendum)
PLEASE CALL ME WITH ANY PROBLEMS OR QUESTIONS (#[phone]).     Plantar Fasciitis Plantar fasciitis is a common condition that causes foot pain. It is soreness (inflammation) of the band of tough fibrous tissue on the bottom of the foot that runs from the heel bone (calcaneus) to the ball of the foot. The cause of this soreness may be from excessive standing, poor fitting shoes, running on hard surfaces, being overweight, having an abnormal walk, or overuse (this is common in runners) of the painful foot or feet. It is also common in aerobic exercise dancers and ballet dancers. SYMPTOMS  Most people with plantar fasciitis complain of:  Severe pain in the morning on the bottom of their foot especially when taking the first steps out of bed. This pain recedes after a few minutes of walking.  Severe pain is experienced also during walking following a long period of inactivity.  Pain is worse when walking barefoot or up stairs DIAGNOSIS   Your caregiver will diagnose this condition by examining and feeling your foot.  Special tests such as X-rays of your foot, are usually not needed. PREVENTION   Consult a sports medicine professional before beginning a new exercise program.  Walking programs offer a good workout. With walking there is a lower chance of overuse injuries common to runners. There is less impact and less jarring of the joints.  Begin all new exercise programs slowly. If problems or pain develop, decrease the amount of time or distance until you are at a comfortable level.  Wear good shoes and replace them regularly.  Stretch your foot and the heel cords at the back of the ankle (Achilles tendon) both before and after exercise.  Run or exercise on even surfaces that are not hard. For example, asphalt is better than pavement.  Do not run barefoot on hard surfaces.  If using a treadmill, vary the incline.  Do not continue to workout if you have foot or joint problems. Seek  professional help if they do not improve. HOME CARE INSTRUCTIONS   Avoid activities that cause you pain until you recover.  Use ice or cold packs on the problem or painful areas after working out.  Only take over-the-counter or prescription medicines for pain, discomfort, or fever as directed by your caregiver.  Soft shoe inserts or athletic shoes with air or gel sole cushions may be helpful.  If problems continue or become more severe, consult a sports medicine caregiver or your own health care provider. Cortisone is a potent anti-inflammatory medication that may be injected into the painful area. You can discuss this treatment with your caregiver. MAKE SURE YOU:   Understand these instructions.  Will watch your condition.  Will get help right away if you are not doing well or get worse. Document Released: 01/17/2001 Document Revised: 07/17/2011 Document Reviewed: 03/18/2008 [facility] Patient Information 2015 [place], [place]. This information is not intended to replace advice given to you by your health care provider. Make sure you discuss any questions you have with your health care provider.

## 2014-10-07 NOTE — Progress Notes (Signed)
Subjective:    Patient ID: James Kim, male    DOB: 11/28/1975, 39 y.o.   MRN: [id number]  HPI   James Kim is here in follow up of his chronic foot pain. He has had more pain along the right heel and medial arch over the last few months. His podiatrists have not offered him any new treatment. It has been some time since he's had an injection. He is wearing [employer] tennis shoes with inserts but doesn't feel that it's making a big difference.  He does feel that the percocet and fentanyl help with the severe pain.   He is on dialysis and follows up closely with his renal MD.  Mood has been fair to good. His family appears supportive.   Pain Inventory Average Pain 8 Pain Right Now 6 My pain is constant, sharp, stabbing and aching  In the last 24 hours, has pain interfered with the following? General activity 8 Relation with others 8 Enjoyment of life 10 What TIME of day is your pain at its worst? morning and evening  Sleep (in general) Poor  Pain is worse with: walking, standing and some activites Pain improves with: rest and medication Relief from Meds: 6  Mobility walk without assistance how many minutes can you walk? 10-20 ability to climb steps?  yes do you drive?  yes  Function disabled: date disabled .  Neuro/Psych weakness trouble walking anxiety  Prior Studies Any changes since last visit?  no  Physicians involved in your care Any changes since last visit?  no   Family History  Problem Relation Age of Onset  . Stroke Mother   . Diabetes Father    History   Social History  . Marital Status: Married    Spouse Name: N/A  . Number of Children: N/A  . Years of Education: N/A   Social History Main Topics  . Smoking status: Never Smoker   . Smokeless tobacco: Current User  . Alcohol Use: Yes  . Drug Use: No  . Sexual Activity: Not on file   Other Topics Concern  . None   Social History Narrative   History reviewed. No pertinent past  surgical history. Past Medical History  Diagnosis Date  . Chronic kidney disease     Born with kidney defect  . Arthritis   . Asthma   . Hypertension    BP 128/72 mmHg  Pulse 80  Resp 14  SpO2 97%  Opioid Risk Score:   Fall Risk Score: Low Fall Risk (0-5 points)`1  Depression screen PHQ 2/9  Depression screen PHQ 2/9 08/10/2014  Decreased Interest 2  Down, Depressed, Hopeless 1  PHQ - 2 Score 3  Altered sleeping 3  Tired, decreased energy 3  Change in appetite 3  Feeling bad or failure about yourself  1  Trouble concentrating 2  Moving slowly or fidgety/restless 0  Suicidal thoughts 0  PHQ-9 Score 15     Review of Systems  Constitutional: Positive for appetite change.       Poor apetite  HENT: Negative.   Eyes: Negative.   Respiratory: Positive for apnea and shortness of breath.        Sleep apnea W/CPAP  Cardiovascular: Negative.   Gastrointestinal: Positive for nausea.  Endocrine: Negative.   Genitourinary: Negative.   Musculoskeletal: Positive for myalgias.       Bilateral foot pain  Skin: Negative.   Allergic/Immunologic: Negative.   Neurological: Positive for weakness.  Trouble walking  Hematological: Negative.   Psychiatric/Behavioral: The patient is nervous/anxious.        Objective:   Physical Exam  General: Alert and oriented x 3, No apparent distress. Obese, weight unchanged.  HEENT: Head is normocephalic, atraumatic, PERRLA, EOMI, sclera anicteric, oral mucosa pink and moist, dentition intact, ext ear canals clear,  Neck: Supple without JVD or lymphadenopathy  Heart: Reg rate and rhythm. No murmurs rubs or gallops  Chest: CTA bilaterally without wheezes, rales, or rhonchi; no distress  Abdomen: Soft, non-tender, non-distended, bowel sounds positive.  Extremities: No clubbing, cyanosis, or edema. Pulses are 2+  Skin: Clean and intact without signs of breakdown. He is wearing fentanyl patch on his right foot. He does have a rash on his  face.  Neuro: Pt is cognitively appropriate with normal insight, memory, and awareness. Cranial nerves 2-12 are intact. Sensory exam is normal even in the feet which are sensitive to touch due to pain. Reflexes are 2+ in all 4's. Fine motor coordination is intact. No tremors. Motor function is grossly 5/5.  Musculoskeletal:No joint deformities seen in either shoulder, elbow, or hand. No knee swelling or abnormalities are appreciated. Bilateral feet continue to be painful with palpation along both calcanei, metatarsal heads, both MTP's, medial arch, and lateral weight bearing area of either feet.  His right medial arch is particularly  Taut and fibrous as well as tender. No tophi. Mild edema noted. No redness or warmth. There was no evidence of breakdown. He walks with significant antalgia on either foot, left moreso than right still.  Psych: Pt's affect is appropriate. Pt is cooperative. He is a little flat but pleasant    ASSESSMENT:  1. Chronic foot pain with history of plantar fasciitis bilaterally, right greater than left  2. Gouty arthritis primarily affecting the feet  3. ?Rheumatoid arthritis primarily affecting the feet  4. 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 Obesity.    Plan:  1. Uloric held per nephrology. Now on colchicine.  2. Continue fentanyl patch for now 3. Continue voltaren gel for topical relief.  4. Percocet for breakthrough pain in place of hydrocodone, 10/325 one q6 prn #120  5. After informed consent and preparation of the skin with betadine and isopropyl alcohol, I injected 6mg  (1cc) of celestone and 3cc of 1% lidocaine around the origination of the right medial arch via inferior/anterior approach. Additionally, aspiration was performed prior to injection. The patient tolerated well, and no complications were encountered. Afterward the area was cleaned and dressed. Post- injection instructions were provided.   6. 15 minutes of face to face patient  care time were spent during this visit. All questions were encouraged and answered. I will see him back in about a month.

## 2014-10-08 LAB — PMP ALCOHOL METABOLITE (ETG): Ethyl Glucuronide (EtG): NEGATIVE ng/mL

## 2014-10-11 LAB — FENTANYL (GC/LC/MS), URINE
Fentanyl, confirm: 0.6 ng/mL (ref <0.5)
Norfentanyl, confirm: 27.8 ng/mL (ref <0.5)

## 2014-10-11 LAB — BENZODIAZEPINES (GC/LC/MS), URINE
Alprazolam metabolite (GC/LC/MS), ur confirm: 48 ng/mL (ref <25)
Clonazepam metabolite (GC/LC/MS), ur confirm: NEGATIVE ng/mL (ref <25)
Flurazepam metabolite (GC/LC/MS), ur confirm: NEGATIVE ng/mL (ref <50)
Lorazepam (GC/LC/MS), ur confirm: NEGATIVE ng/mL (ref <50)
MIDAZOLAMU: NEGATIVE ng/mL (ref <50)
Nordiazepam (GC/LC/MS), ur confirm: NEGATIVE ng/mL (ref <50)
Oxazepam (GC/LC/MS), ur confirm: NEGATIVE ng/mL (ref <50)
Temazepam (GC/LC/MS), ur confirm: NEGATIVE ng/mL (ref <50)
Triazolam metabolite (GC/LC/MS), ur confirm: NEGATIVE ng/mL (ref <50)

## 2014-10-11 LAB — OXYCODONE, URINE (LC/MS-MS)
Noroxycodone, Ur: 204 ng/mL (ref <50)
Oxycodone, ur: 60 ng/mL (ref <50)
Oxymorphone: 427 ng/mL (ref <50)

## 2014-10-13 LAB — PRESCRIPTION MONITORING PROFILE (SOLSTAS)
Amphetamine/Meth: NEGATIVE ng/mL
Barbiturate Screen, Urine: NEGATIVE ng/mL
Buprenorphine, Urine: NEGATIVE ng/mL
CARISOPRODOL, URINE: NEGATIVE ng/mL
COCAINE METABOLITES: NEGATIVE ng/mL
Cannabinoid Scrn, Ur: NEGATIVE ng/mL
Creatinine, Urine: 115.22 mg/dL (ref >20.0)
MDMA URINE: NEGATIVE ng/mL
METHADONE SCREEN, URINE: NEGATIVE ng/mL
Meperidine, Ur: NEGATIVE ng/mL
Nitrites, Initial: NEGATIVE ug/mL
OPIATE SCREEN, URINE: NEGATIVE ng/mL
Propoxyphene: NEGATIVE ng/mL
Tapentadol, urine: NEGATIVE ng/mL
Tramadol Scrn, Ur: NEGATIVE ng/mL
Zolpidem, Urine: NEGATIVE ng/mL
pH, Initial: 7.1 pH (ref 4.5–8.9)

## 2014-10-21 NOTE — Progress Notes (Signed)
Urine drug screen for this encounter is consistent for prescribed medication 

## 2014-11-05 ENCOUNTER — Encounter: Payer: Self-pay | Admitting: Registered Nurse

## 2014-11-05 ENCOUNTER — Encounter (HOSPITAL_BASED_OUTPATIENT_CLINIC_OR_DEPARTMENT_OTHER): Payer: Medicare Other | Admitting: Registered Nurse

## 2014-11-05 VITALS — BP 156/80 | HR 68 | Resp 14

## 2014-11-05 DIAGNOSIS — N185 Chronic kidney disease, stage 5: Secondary | ICD-10-CM | POA: Diagnosis not present

## 2014-11-05 DIAGNOSIS — Z79899 Other long term (current) drug therapy: Secondary | ICD-10-CM

## 2014-11-05 DIAGNOSIS — Z5181 Encounter for therapeutic drug level monitoring: Secondary | ICD-10-CM

## 2014-11-05 DIAGNOSIS — M722 Plantar fascial fibromatosis: Secondary | ICD-10-CM

## 2014-11-05 DIAGNOSIS — G894 Chronic pain syndrome: Secondary | ICD-10-CM | POA: Diagnosis not present

## 2014-11-05 MED ORDER — OXYCODONE-ACETAMINOPHEN 10-325 MG PO TABS: 1.0000 | ORAL_TABLET | Freq: Four times a day (QID) | ORAL | Status: DC | PRN

## 2014-11-05 MED ORDER — FENTANYL 25 MCG/HR TD PT72: 25.0000 ug | MEDICATED_PATCH | TRANSDERMAL | Status: DC

## 2014-11-05 NOTE — Progress Notes (Signed)
Subjective:    Patient ID: James Kim, male    DOB: 16-Feb-1976, 39 y.o.   MRN: [id number]  HPI: [name] is a 39 year old male who returns for follow up for chronic pain and medication refill. He says his pain is located in his bilateral heels and bilateral feet. He rates his pain 4. His current exercise regime is walking daily for 10-15 minutes..  S/P steroid injection right medial arch received two days of relief.  Pain Inventory Average Pain 8 Pain Right Now 4 My pain is intermittent, sharp, stabbing and aching  In the last 24 hours, has pain interfered with the following? General activity 6 Relation with others 4 Enjoyment of life 8 What TIME of day is your pain at its worst? morning and night Sleep (in general) Poor  Pain is worse with: walking and standing Pain improves with: rest and medication Relief from Meds: 6  Mobility walk without assistance how many minutes can you walk? 10-15 ability to climb steps?  yes do you drive?  yes  Function disabled: date disabled .  Neuro/Psych trouble walking  Prior Studies Any changes since last visit?  no  Physicians involved in your care Any changes since last visit?  no   Family History  Problem Relation Age of Onset  . Stroke Mother   . Diabetes Father    History   Social History  . Marital Status: Married    Spouse Name: N/A  . Number of Children: N/A  . Years of Education: N/A   Social History Main Topics  . Smoking status: Never Smoker   . Smokeless tobacco: Current User  . Alcohol Use: Yes  . Drug Use: No  . Sexual Activity: Not on file   Other Topics Concern  . None   Social History Narrative   History reviewed. No pertinent past surgical history. Past Medical History  Diagnosis Date  . Chronic kidney disease     Born with kidney defect  . Arthritis   . Asthma   . Hypertension    BP 156/80 mmHg  Pulse 68  Resp 14  SpO2 100%  Opioid Risk Score:   Fall Risk Score:  Low Fall Risk (0-5 points)`1  Depression screen PHQ 2/9  Depression screen PHQ 2/9 08/10/2014  Decreased Interest 2  Down, Depressed, Hopeless 1  PHQ - 2 Score 3  Altered sleeping 3  Tired, decreased energy 3  Change in appetite 3  Feeling bad or failure about yourself  1  Trouble concentrating 2  Moving slowly or fidgety/restless 0  Suicidal thoughts 0  PHQ-9 Score 15     Review of Systems  Constitutional: Positive for appetite change.       Poor appetite  HENT: Negative.   Eyes: Negative.   Respiratory:       Sleep apnea W/CPAP  Cardiovascular: Negative.   Gastrointestinal: Positive for nausea.  Endocrine: Negative.   Genitourinary: Negative.   Musculoskeletal: Positive for myalgias, back pain and arthralgias.  Allergic/Immunologic: Negative.   Neurological:       Trouble walking  Hematological: Negative.   Psychiatric/Behavioral: The patient is nervous/anxious.        Objective:   Physical Exam  Constitutional: He is oriented to person, place, and time. He appears well-developed and well-nourished.  HENT:  Head: Normocephalic and atraumatic.  Neck: Normal range of motion. Neck supple.  Cardiovascular: Normal rate and regular rhythm.   Pulmonary/Chest: Effort normal and breath sounds normal.  Musculoskeletal:  Normal Muscle Bulk and Muscle Testing Reveals: Upper Extremities: Full ROM and muscle Strength 5/5 Lower Extremities: Full ROM and Muscle Strength 5/5 Bilateral Lower Extremities Flexion Produces pain into Bilateral Heels Arises from chair with ease Antalgic Gait  Neurological: He is alert and oriented to person, place, and time.  Skin: Skin is warm and dry.  Psychiatric: He has a normal mood and affect.  Nursing note and vitals reviewed.         Assessment & Plan:  1.Chronic foot pain with history of plantar fasciitis: Refilled: Oxycodone 10/325 mg one tablet every 6 hours as needed #120 and Fentanyl 25 mcg one patch every three days #10.   Second script given for the following month 2.Chronic Kidney Disease stage V:On Peritoneal Dialysis/ Nephrology Following. 3. Anxiety: Continue Xanax 0.5 mg BID/prn   20 minutes of face to face patient care time was spent during this visit. All questions were encouraged and answered.  .  F/U in 1 month

## 2014-12-07 ENCOUNTER — Encounter: Payer: BLUE CROSS/BLUE SHIELD | Attending: Physical Medicine & Rehabilitation | Admitting: Registered Nurse

## 2014-12-07 ENCOUNTER — Encounter: Payer: Self-pay | Admitting: Registered Nurse

## 2014-12-07 VITALS — BP 122/67 | HR 77 | Resp 14

## 2014-12-07 DIAGNOSIS — M722 Plantar fascial fibromatosis: Secondary | ICD-10-CM | POA: Diagnosis not present

## 2014-12-07 DIAGNOSIS — L700 Acne vulgaris: Secondary | ICD-10-CM | POA: Diagnosis not present

## 2014-12-07 DIAGNOSIS — M1009 Idiopathic gout, multiple sites: Secondary | ICD-10-CM | POA: Insufficient documentation

## 2014-12-07 DIAGNOSIS — G894 Chronic pain syndrome: Secondary | ICD-10-CM | POA: Diagnosis not present

## 2014-12-07 DIAGNOSIS — Z5181 Encounter for therapeutic drug level monitoring: Secondary | ICD-10-CM

## 2014-12-07 DIAGNOSIS — N185 Chronic kidney disease, stage 5: Secondary | ICD-10-CM | POA: Diagnosis not present

## 2014-12-07 DIAGNOSIS — Z79899 Other long term (current) drug therapy: Secondary | ICD-10-CM | POA: Diagnosis present

## 2014-12-07 DIAGNOSIS — L739 Follicular disorder, unspecified: Secondary | ICD-10-CM | POA: Diagnosis not present

## 2014-12-07 MED ORDER — OXYCODONE-ACETAMINOPHEN 10-325 MG PO TABS: 1.0000 | ORAL_TABLET | Freq: Four times a day (QID) | ORAL | Status: DC | PRN

## 2014-12-07 MED ORDER — FENTANYL 25 MCG/HR TD PT72: 25.0000 ug | MEDICATED_PATCH | TRANSDERMAL | Status: DC

## 2014-12-07 MED ORDER — ALPRAZOLAM 0.5 MG PO TABS: 0.5000 mg | ORAL_TABLET | Freq: Two times a day (BID) | ORAL | Status: DC | PRN

## 2014-12-07 NOTE — Progress Notes (Signed)
Subjective:    Patient.D: James Kim, male    DOB: 10-Jun-1975, 39 y.o.   MRN: [id number]  HPI: [name] is a 39 year old male who returns for follow up for chronic pain and medication refill. He says his pain is located in his bilateral heels and bilateral feet. He rates his pain 4. His current exercise regime is walking daily for 10-15 minutes.  Pain Inventory Average Pain 9 Pain Right Now 4 My pain is sharp, stabbing and aching  In the last 24 hours, has pain interfered with the following? General activity 8 Relation with others 4 Enjoyment of life 10 What TIME of day is your pain at its worst? morning, evening  Sleep (in general) Poor  Pain is worse with: walking and standing Pain improves with: rest and medication Relief from Meds: 6  Mobility walk without assistance how many minutes can you walk? 10-20 ability to climb steps?  yes do you drive?  yes Do you have any goals in this area?  yes  Function disabled: date disabled . I need assistance with the following:  household duties Do you have any goals in this area?  yes  Neuro/Psych weakness trouble walking anxiety  Prior Studies Any changes since last visit?  no  Physicians involved in your care Any changes since last visit?  no   Family History  Problem Relation Age of Onset  . Stroke Mother   . Diabetes Father    History   Social History  . Marital Status: Married    Spouse Name: N/A  . Number of Children: N/A  . Years of Education: N/A   Social History Main Topics  . Smoking status: Never Smoker   . Smokeless tobacco: Current User  . Alcohol Use: Yes  . Drug Use: No  . Sexual Activity: Not on file   Other Topics Concern  . None   Social History Narrative   History reviewed. No pertinent past surgical history. Past Medical History  Diagnosis Date  . Chronic kidney disease     Born with kidney defect  . Arthritis   . Asthma   . Hypertension    BP 122/67 mmHg   Pulse 77  Resp 14  SpO2 96%  Opioid Risk Score:   Fall Risk Score:  `1  Depression screen PHQ 2/9  Depression screen PHQ 2/9 08/10/2014  Decreased Interest 2  Down, Depressed, Hopeless 1  PHQ - 2 Score 3  Altered sleeping 3  Tired, decreased energy 3  Change in appetite 3  Feeling bad or failure about yourself  1  Trouble concentrating 2  Moving slowly or fidgety/restless 0  Suicidal thoughts 0  PHQ-9 Score 15     Review of Systems  Constitutional: Positive for appetite change.       Poor appetite  Respiratory: Positive for apnea and shortness of breath.   Gastrointestinal: Positive for nausea.  Musculoskeletal: Positive for gait problem.  Neurological: Positive for weakness.  Psychiatric/Behavioral: The patient is nervous/anxious.   All other systems reviewed and are negative.      Objective:   Physical Exam  Constitutional: He is oriented to person, place, and time. He appears well-developed and well-nourished.  HENT:  Head: Normocephalic and atraumatic.  Neck: Normal range of motion. Neck supple.  Cardiovascular: Normal rate and regular rhythm.   Pulmonary/Chest: Effort normal and breath sounds normal.  Abdominal:  Tenckhoff Catheter Intact  Musculoskeletal:  Normal Muscle Bulk and Muscle testing Reveals:  Upper Extremities: Full ROM and Muscle Strength 5/5 Lower Extremities: Full ROM and Muscle Strength 5/5 Arises from chair with ease Narrow Based Gait   Neurological: He is alert and oriented to person, place, and time.  Skin: Skin is warm and dry.  Psychiatric: He has a normal mood and affect.  Nursing note and vitals reviewed.         Assessment & Plan:  1.Chronic foot pain with history of plantar fasciitis: Refilled: Oxycodone 10/325 mg one tablet every 6 hours as needed #120 and Fentanyl 25 mcg one patch every three days #10.  2.Chronic Kidney Disease stage V:On Peritoneal Dialysis/ Nephrology Following. 3. Anxiety: Continue Xanax 0.5 mg  BID/prn   20 minutes of face to face patient care time was spent during this visit. All questions were encouraged and answered.  .  F/U in 1 month

## 2014-12-09 ENCOUNTER — Telehealth: Payer: Self-pay | Admitting: Registered Nurse

## 2015-01-25 ENCOUNTER — Encounter: Payer: BLUE CROSS/BLUE SHIELD | Attending: Physical Medicine & Rehabilitation | Admitting: Registered Nurse

## 2015-01-25 ENCOUNTER — Encounter: Payer: Self-pay | Admitting: Registered Nurse

## 2015-01-25 VITALS — BP 154/94 | HR 109

## 2015-01-25 DIAGNOSIS — Z79899 Other long term (current) drug therapy: Secondary | ICD-10-CM | POA: Insufficient documentation

## 2015-01-25 DIAGNOSIS — G894 Chronic pain syndrome: Secondary | ICD-10-CM | POA: Diagnosis not present

## 2015-01-25 DIAGNOSIS — L700 Acne vulgaris: Secondary | ICD-10-CM | POA: Diagnosis not present

## 2015-01-25 DIAGNOSIS — M1009 Idiopathic gout, multiple sites: Secondary | ICD-10-CM | POA: Insufficient documentation

## 2015-01-25 DIAGNOSIS — N185 Chronic kidney disease, stage 5: Secondary | ICD-10-CM | POA: Diagnosis not present

## 2015-01-25 DIAGNOSIS — M722 Plantar fascial fibromatosis: Secondary | ICD-10-CM | POA: Insufficient documentation

## 2015-01-25 DIAGNOSIS — Z5181 Encounter for therapeutic drug level monitoring: Secondary | ICD-10-CM | POA: Diagnosis not present

## 2015-01-25 DIAGNOSIS — L739 Follicular disorder, unspecified: Secondary | ICD-10-CM | POA: Diagnosis not present

## 2015-01-25 MED ORDER — OXYCODONE-ACETAMINOPHEN 10-325 MG PO TABS: 1.0000 | ORAL_TABLET | Freq: Four times a day (QID) | ORAL | Status: DC | PRN

## 2015-01-25 MED ORDER — FENTANYL 25 MCG/HR TD PT72: 25.0000 ug | MEDICATED_PATCH | TRANSDERMAL | Status: DC

## 2015-01-25 NOTE — Progress Notes (Signed)
Subjective:    Patient ID: James Kim, male    DOB: 11-Nov-1975, 39 y.o.   MRN: [id number]  HPI: James Kim is a 39 year old male who returns for follow up for chronic pain and medication refill. He says his pain is located in his bilateral heels and bilateral feet. Also states his pain has intensified and feels he's not receiving adequate medication relief. Also wanted an increase in his Xanax , he is aware medications will remain the same until I speak with Dr. Riley Kill and will call him he verbalizes understanding.He rates his pain 7. His current exercise regime is walking daily for short distances.   Pain Inventory Average Pain 9 Pain Right Now 7 My pain is sharp, stabbing and aching  In the last 24 hours, has pain interfered with the following? General activity 7 Relation with others 7 Enjoyment of life 10 What TIME of day is your pain at its worst? morning, evening Sleep (in general) Poor  Pain is worse with: walking, standing and some activites Pain improves with: rest, heat/ice, medication and injections Relief from Meds: 5  Mobility walk without assistance how many minutes can you walk? 10-20 ability to climb steps?  yes do you drive?  yes Do you have any goals in this area?  yes  Function disabled: date disabled na I need assistance with the following:  household duties Do you have any goals in this area?  yes  Neuro/Psych weakness trouble walking anxiety  Prior Studies Any changes since last visit?  no  Physicians involved in your care Any changes since last visit?  no Primary care na Neurosurgeon na   Family History  Problem Relation Age of Onset  . Stroke Mother   . Diabetes Father    Social History   Social History  . Marital Status: Married    Spouse Name: N/A  . Number of Children: N/A  . Years of Education: N/A   Social History Main Topics  . Smoking status: Never Smoker   . Smokeless tobacco: Current User  . Alcohol  Use: Yes  . Drug Use: No  . Sexual Activity: Not Asked   Other Topics Concern  . None   Social History Narrative   No past surgical history on file. Past Medical History  Diagnosis Date  . Chronic kidney disease     Born with kidney defect  . Arthritis   . Asthma   . Hypertension    BP 154/94 mmHg  Pulse 109  SpO2 100%  Opioid Risk Score:   Fall Risk Score:  `1  Depression screen PHQ 2/9  Depression screen [facility] 2/9 01/25/2015 08/10/2014  Decreased Interest 0 2  Down, Depressed, Hopeless 0 1  PHQ - 2 Score 0 3  Altered sleeping - 3  Tired, decreased energy - 3  Change in appetite - 3  Feeling bad or failure about yourself  - 1  Trouble concentrating - 2  Moving slowly or fidgety/restless - 0  Suicidal thoughts - 0  PHQ-9 Score - 15     Review of Systems  Constitutional: Positive for appetite change.  Respiratory: Positive for apnea and shortness of breath.   Gastrointestinal: Positive for nausea.  Musculoskeletal:       Limb swelling  All other systems reviewed and are negative.      Objective:   Physical Exam  Constitutional: He is oriented to person, place, and time. He appears well-developed and well-nourished.  HENT:  Head: Normocephalic and atraumatic.  Neck: Normal range of motion. Neck supple.  Cardiovascular: Normal rate and regular rhythm.   Pulmonary/Chest: Effort normal and breath sounds normal.  Abdominal: Soft. Bowel sounds are normal.  Musculoskeletal:  Normal Muscle Bulk and Muscle Testing Reveals:  Upper Extremities: Full ROM and Muscle Strength 5/5 Lower extremities: Full ROM and Muscle Strength 5/5 Bilateral Lower extremities Flexion Produces Pain into Bilateral feet Arises from chair slowly Antalgic Gait  Neurological: He is alert and oriented to person, place, and time.  Skin: Skin is warm and dry.  Psychiatric: He has a normal mood and affect.  Nursing note and vitals reviewed.         Assessment & Plan:  1.Chronic foot  pain with history of plantar fasciitis: Refilled: Oxycodone 10/325 mg one tablet every 6 hours as needed #120 and Fentanyl 25 mcg one patch every three days #10.  2.Chronic Kidney Disease stage V:On Peritoneal Dialysis/ Nephrology Following. 3. Anxiety: Continue Xanax 0.5 mg BID/prn   30 minutes of face to face patient care time was spent during this visit. All questions were encouraged and answered.  .  F/U in 1 month

## 2015-01-27 ENCOUNTER — Telehealth: Payer: Self-pay | Admitting: Registered Nurse

## 2015-01-27 MED ORDER — FENTANYL 25 MCG/HR TD PT72: 25.0000 ug | MEDICATED_PATCH | TRANSDERMAL | Status: DC

## 2015-01-27 MED ORDER — OXYCODONE-ACETAMINOPHEN 10-325 MG PO TABS: 1.0000 | ORAL_TABLET | Freq: Four times a day (QID) | ORAL | Status: DC | PRN

## 2015-01-27 NOTE — Telephone Encounter (Signed)
Spoke with Dr. Riley Kill regarding James Kim increase intensity of pain. We will increase his Fentanyl to every 48 hours. Also encouraged James Kim to follow up with Rheumatologist. He verbalizes understanding. James Kim will return the previous prescriptions . New prescriptions printed. He will picked them up today.

## 2015-02-04 DIAGNOSIS — Z992 Dependence on renal dialysis: Secondary | ICD-10-CM | POA: Diagnosis not present

## 2015-02-04 DIAGNOSIS — N186 End stage renal disease: Secondary | ICD-10-CM | POA: Diagnosis not present

## 2015-02-23 ENCOUNTER — Ambulatory Visit: Payer: BLUE CROSS/BLUE SHIELD | Admitting: Physical Medicine & Rehabilitation

## 2015-02-24 ENCOUNTER — Encounter: Payer: BLUE CROSS/BLUE SHIELD | Attending: Physical Medicine & Rehabilitation | Admitting: Registered Nurse

## 2015-02-24 ENCOUNTER — Encounter: Payer: Self-pay | Admitting: Registered Nurse

## 2015-02-24 ENCOUNTER — Encounter: Payer: BLUE CROSS/BLUE SHIELD | Admitting: Physical Medicine & Rehabilitation

## 2015-02-24 ENCOUNTER — Other Ambulatory Visit: Payer: Self-pay | Admitting: Registered Nurse

## 2015-02-24 VITALS — BP 153/90 | HR 97 | Resp 15

## 2015-02-24 DIAGNOSIS — G894 Chronic pain syndrome: Secondary | ICD-10-CM | POA: Diagnosis not present

## 2015-02-24 DIAGNOSIS — Z79899 Other long term (current) drug therapy: Secondary | ICD-10-CM

## 2015-02-24 DIAGNOSIS — M722 Plantar fascial fibromatosis: Secondary | ICD-10-CM | POA: Diagnosis not present

## 2015-02-24 DIAGNOSIS — N185 Chronic kidney disease, stage 5: Secondary | ICD-10-CM

## 2015-02-24 DIAGNOSIS — M1009 Idiopathic gout, multiple sites: Secondary | ICD-10-CM | POA: Diagnosis not present

## 2015-02-24 DIAGNOSIS — R238 Other skin changes: Secondary | ICD-10-CM

## 2015-02-24 DIAGNOSIS — Z5181 Encounter for therapeutic drug level monitoring: Secondary | ICD-10-CM

## 2015-02-24 DIAGNOSIS — L989 Disorder of the skin and subcutaneous tissue, unspecified: Secondary | ICD-10-CM

## 2015-02-24 MED ORDER — FENTANYL 25 MCG/HR TD PT72: 25.0000 ug | MEDICATED_PATCH | TRANSDERMAL | Status: DC

## 2015-02-24 MED ORDER — OXYCODONE-ACETAMINOPHEN 10-325 MG PO TABS: 1.0000 | ORAL_TABLET | Freq: Four times a day (QID) | ORAL | Status: DC | PRN

## 2015-02-24 MED ORDER — ALPRAZOLAM 0.5 MG PO TABS
0.5000 mg | ORAL_TABLET | Freq: Two times a day (BID) | ORAL | Status: DC | PRN
Start: 2015-02-24 — End: 2015-05-11

## 2015-02-24 NOTE — Progress Notes (Signed)
Subjective:    Patient ID: James Kim, male    DOB: 1976/01/10, 39 y.o.   MRN: [id number]  HPI: James Kim is a 39 year old male who returns for follow up for chronic pain and medication refill. He says his pain is located in his bilateral heels and bilateral feet. Also states the Oxycodone wears off in three hours, will speak with Dr. Riley Kill regarding increasing Oxycodone and give him a call he verbalizes understanding. He rates his pain 5. His current exercise regime is walking daily for short distances.  Pain Inventory Average Pain 8 Pain Right Now 5 My pain is constant, sharp, stabbing and aching  In the last 24 hours, has pain interfered with the following? General activity 8 Relation with others 4 Enjoyment of life 10 What TIME of day is your pain at its worst? Morning and Evening Sleep (in general) Poor  Pain is worse with: walking, standing and some activites Pain improves with: rest, heat/ice, medication and injections Relief from Meds: 8  Mobility walk without assistance how many minutes can you walk? 10-20 ability to climb steps?  yes do you drive?  yes Do you have any goals in this area?  yes  Function disabled: date disabled NA I need assistance with the following:  household duties Do you have any goals in this area?  yes  Neuro/Psych trouble walking anxiety  Prior Studies Any changes since last visit?  no  Physicians involved in your care Any changes since last visit?  no   Family History  Problem Relation Age of Onset  . Stroke Mother   . Diabetes Father    Social History   Social History  . Marital Status: Married    Spouse Name: N/A  . Number of Children: N/A  . Years of Education: N/A   Social History Main Topics  . Smoking status: Never Smoker   . Smokeless tobacco: Current User  . Alcohol Use: Yes  . Drug Use: No  . Sexual Activity: Not on file   Other Topics Concern  . Not on file   Social History Narrative     No past surgical history on file. Past Medical History  Diagnosis Date  . Chronic kidney disease     Born with kidney defect  . Arthritis   . Asthma   . Hypertension    There were no vitals taken for this visit.  Opioid Risk Score:   Fall Risk Score:  `1  Depression screen PHQ 2/9  Depression screen [facility] 2/9 01/25/2015 08/10/2014  Decreased Interest 0 2  Down, Depressed, Hopeless 0 1  PHQ - 2 Score 0 3  Altered sleeping - 3  Tired, decreased energy - 3  Change in appetite - 3  Feeling bad or failure about yourself  - 1  Trouble concentrating - 2  Moving slowly or fidgety/restless - 0  Suicidal thoughts - 0  PHQ-9 Score - 15     Review of Systems  Constitutional: Positive for appetite change.  Respiratory: Positive for apnea.   Gastrointestinal: Positive for nausea.       Objective:   Physical Exam  Constitutional: He is oriented to person, place, and time. He appears well-developed and well-nourished.  HENT:  Head: Normocephalic and atraumatic.  Neck: Normal range of motion. Neck supple.  Cardiovascular: Normal rate and regular rhythm.   Pulmonary/Chest: Effort normal and breath sounds normal.  Neurological: He is alert and oriented to person, place, and time.  Skin: Skin is warm and dry. There is erythema.  Lower Extremities red with pin point papules  Psychiatric: He has a normal mood and affect.  Nursing note and vitals reviewed.         Assessment & Plan:  1.Chronic foot pain with history of plantar fasciitis: Refilled: Oxycodone 10/325 mg one tablet every 6 hours as needed #120 and Fentanyl 25 mcg one patch every three days #10.  2.Chronic Kidney Disease stage V:On Peritoneal Dialysis/ Nephrology Following. 3. Anxiety: Continue Xanax 0.5 mg BID/prn  4. Papules: F/U with Dermatology  20 minutes of face to face patient care time was spent during this visit. All questions were encouraged and answered.  .  F/U in 1 month

## 2015-02-25 ENCOUNTER — Telehealth: Payer: Self-pay | Admitting: Physical Medicine & Rehabilitation

## 2015-02-25 LAB — PMP ALCOHOL METABOLITE (ETG): ETGU: NEGATIVE ng/mL

## 2015-02-25 NOTE — Telephone Encounter (Signed)
Patient just seen neurologist and he needs to know that they have prescribed him a medication and was not sure if it would violate his contract.  The doctor was foreign and he really didn't understand him.  Please call patient.

## 2015-02-25 NOTE — Telephone Encounter (Signed)
Spoke with pt. Pt. Advised that Neurologist started him on Carbatrol. Advised that this should not affect his pain contract. Advised to call back if he has any questions or concerns. [facility]

## 2015-03-01 ENCOUNTER — Ambulatory Visit: Payer: BLUE CROSS/BLUE SHIELD | Admitting: Registered Nurse

## 2015-03-01 LAB — OXYCODONE, URINE (LC/MS-MS)
Noroxycodone, Ur: 318 ng/mL (ref <50)
OXYCODONE, UR: 592 ng/mL (ref <50)
Oxymorphone: 635 ng/mL (ref <50)

## 2015-03-01 LAB — BENZODIAZEPINES (GC/LC/MS), URINE
Alprazolam metabolite (GC/LC/MS), ur confirm: 196 ng/mL (ref <25)
Clonazepam metabolite (GC/LC/MS), ur confirm: NEGATIVE ng/mL (ref <25)
Flurazepam metabolite (GC/LC/MS), ur confirm: NEGATIVE ng/mL (ref <50)
Lorazepam (GC/LC/MS), ur confirm: NEGATIVE ng/mL (ref <50)
MIDAZOLAMU: NEGATIVE ng/mL (ref <50)
Nordiazepam (GC/LC/MS), ur confirm: NEGATIVE ng/mL (ref <50)
Oxazepam (GC/LC/MS), ur confirm: NEGATIVE ng/mL (ref <50)
TRIAZOLAMU: NEGATIVE ng/mL (ref <50)
Temazepam (GC/LC/MS), ur confirm: NEGATIVE ng/mL (ref <50)

## 2015-03-01 LAB — FENTANYL (GC/LC/MS), URINE
FENTANYL (GC/MS) CONFIRM: 8.9 ng/mL (ref <0.5)
NORFENTANYL (GC/MS) CONFIRM: 83.1 ng/mL (ref <0.5)

## 2015-03-02 LAB — PRESCRIPTION MONITORING PROFILE (SOLSTAS)
AMPHETAMINE/METH: NEGATIVE ng/mL
Barbiturate Screen, Urine: NEGATIVE ng/mL
Buprenorphine, Urine: NEGATIVE ng/mL
COCAINE METABOLITES: NEGATIVE ng/mL
Cannabinoid Scrn, Ur: NEGATIVE ng/mL
Carisoprodol, Urine: NEGATIVE ng/mL
Creatinine, Urine: 149.49 mg/dL (ref >20.0)
MDMA URINE: NEGATIVE ng/mL
MEPERIDINE UR: NEGATIVE ng/mL
METHADONE SCREEN, URINE: NEGATIVE ng/mL
Nitrites, Initial: NEGATIVE ug/mL
Opiate Screen, Urine: NEGATIVE ng/mL
PH URINE, INITIAL: 5.6 pH (ref 4.5–8.9)
Propoxyphene: NEGATIVE ng/mL
TAPENTADOLUR: NEGATIVE ng/mL
Tramadol Scrn, Ur: NEGATIVE ng/mL
Zolpidem, Urine: NEGATIVE ng/mL

## 2015-03-03 ENCOUNTER — Telehealth: Payer: Self-pay | Admitting: Registered Nurse

## 2015-03-17 NOTE — Progress Notes (Signed)
Urine drug screen for this encounter is consistent for prescribed medication 

## 2015-03-24 ENCOUNTER — Encounter
Payer: BLUE CROSS/BLUE SHIELD | Attending: Physical Medicine & Rehabilitation | Admitting: Physical Medicine & Rehabilitation

## 2015-03-24 ENCOUNTER — Encounter: Payer: Self-pay | Admitting: Physical Medicine & Rehabilitation

## 2015-03-24 VITALS — BP 139/77 | HR 69

## 2015-03-24 DIAGNOSIS — M722 Plantar fascial fibromatosis: Secondary | ICD-10-CM | POA: Insufficient documentation

## 2015-03-24 DIAGNOSIS — N185 Chronic kidney disease, stage 5: Secondary | ICD-10-CM | POA: Insufficient documentation

## 2015-03-24 DIAGNOSIS — Z5181 Encounter for therapeutic drug level monitoring: Secondary | ICD-10-CM | POA: Insufficient documentation

## 2015-03-24 DIAGNOSIS — M1009 Idiopathic gout, multiple sites: Secondary | ICD-10-CM | POA: Diagnosis not present

## 2015-03-24 DIAGNOSIS — Z79899 Other long term (current) drug therapy: Secondary | ICD-10-CM

## 2015-03-24 DIAGNOSIS — M05779 Rheumatoid arthritis with rheumatoid factor of unspecified ankle and foot without organ or systems involvement: Secondary | ICD-10-CM

## 2015-03-24 MED ORDER — FENTANYL 25 MCG/HR TD PT72
25.0000 ug | MEDICATED_PATCH | TRANSDERMAL | Status: DC
Start: 2015-03-24 — End: 2015-06-03

## 2015-03-24 MED ORDER — OXYCODONE-ACETAMINOPHEN 10-325 MG PO TABS: 1.0000 | ORAL_TABLET | Freq: Four times a day (QID) | ORAL | Status: DC | PRN

## 2015-03-24 MED ORDER — NONFORMULARY OR COMPOUNDED ITEM: 60.0000 g | Freq: Three times a day (TID) | Status: DC

## 2015-03-24 MED ORDER — FENTANYL 25 MCG/HR TD PT72: 25.0000 ug | MEDICATED_PATCH | TRANSDERMAL | Status: DC

## 2015-03-24 NOTE — Progress Notes (Signed)
Subjective:    Patient ID: James Kim, male    DOB: Feb 01, 1976, 39 y.o.   MRN: [id number]  HPI   James Kim is here in follow up of his chronic pain. He hasn't been back to rheumatology after Dr. Dareen Piano left the practice. He states he wasn't sure what to do given his provider left. His pain levels have been better with the change of the fentanyl to q48 hours. He doesn't feel that the percocet last long enough for breakthrough pain (only 2.5 hours).   He states the injection to his right heel in May was somewhat helpful but only lasted 3 or 4 days. He continues to struggle with cold weather and tries to keep his feet warm. He wears reasonable shoes.     Pain Inventory Average Pain 9 Pain Right Now 6 My pain is sharp, stabbing and aching  In the last 24 hours, has pain interfered with the following? General activity 9 Relation with others 5 Enjoyment of life 9 What TIME of day is your pain at its worst? morning and evening Sleep (in general) Poor  Pain is worse with: walking, standing and some activites Pain improves with: rest, heat/ice and medication Relief from Meds: 5  Mobility walk without assistance how many minutes can you walk? 15-20 ability to climb steps?  yes do you drive?  yes Do you have any goals in this area?  yes  Function disabled: date disabled . I need assistance with the following:  household duties Do you have any goals in this area?  yes  Neuro/Psych trouble walking anxiety  Prior Studies Any changes since last visit?  yes, mouth issues  Physicians involved in your care Dentist   Family History  Problem Relation Age of Onset  . Stroke Mother   . Diabetes Father    Social History   Social History  . Marital Status: Married    Spouse Name: N/A  . Number of Children: N/A  . Years of Education: N/A   Social History Main Topics  . Smoking status: Never Smoker   . Smokeless tobacco: Current User  . Alcohol Use: Yes  . Drug  Use: No  . Sexual Activity: Not Asked   Other Topics Concern  . None   Social History Narrative   History reviewed. No pertinent past surgical history. Past Medical History  Diagnosis Date  . Chronic kidney disease     Born with kidney defect  . Arthritis   . Asthma   . Hypertension    BP 139/77 mmHg  Pulse 69  SpO2 98%  Opioid Risk Score:   Fall Risk Score:  `1  Depression screen PHQ 2/9  Depression screen [facility] 2/9 01/25/2015 08/10/2014  Decreased Interest 0 2  Down, Depressed, Hopeless 0 1  PHQ - 2 Score 0 3  Altered sleeping - 3  Tired, decreased energy - 3  Change in appetite - 3  Feeling bad or failure about yourself  - 1  Trouble concentrating - 2  Moving slowly or fidgety/restless - 0  Suicidal thoughts - 0  PHQ-9 Score - 15      Review of Systems  Constitutional: Positive for appetite change.  Respiratory: Positive for shortness of breath.   Gastrointestinal: Positive for nausea.  Musculoskeletal: Positive for gait problem.  All other systems reviewed and are negative.      Objective:   Physical Exam  General: Alert and oriented x 3, No apparent distress. Obese, weight unchanged.  HEENT: Head is normocephalic, atraumatic, PERRLA, EOMI, sclera anicteric, oral mucosa pink and moist, dentition intact, ext ear canals clear,  Neck: Supple without JVD or lymphadenopathy  Heart: Reg rate and rhythm. No murmurs rubs or gallops  Chest: CTA bilaterally without wheezes, rales, or rhonchi; no distress  Abdomen: Soft, non-tender, non-distended, bowel sounds positive.  Extremities: No clubbing, cyanosis, or edema. Pulses are 2+  Skin: multiple maculopapular marks on feet and shins---states they are from flea bites and skin reaction. .  Neuro: Pt is cognitively appropriate with normal insight, memory, and awareness. Cranial nerves 2-12 are intact. Sensory exam is normal even in the feet which are sensitive to touch due to pain. Reflexes are 2+ in all 4's. Fine motor  coordination is intact. No tremors. Motor function is grossly 5/5.  Musculoskeletal:No joint deformities seen in either shoulder, elbow, or hand. No knee swelling or abnormalities are appreciated. Bilateral feet continue to be painful with palpation along both calcanei, metatarsal heads, both MTP's, medial arch, and lateral weight bearing area of either feet once again. Pain intensity appears to be a little better as a whole. His right medial arch remains the most sensitive..  No redness or warmth. There was no evidence of breakdown. He walks with significant antalgia on either foot, left moreso than right still. Both feet were cool to touch.  Psych: Pt's affect is appropriate. Pt is cooperative. He is a little flat but pleasant    ASSESSMENT:  1. Chronic foot pain with history of plantar fasciitis bilaterally, right greater than left  2. Gouty arthritis primarily affecting the feet  3. ?Rheumatoid arthritis primarily affecting the feet  4. 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 Obesity.    Plan:  1. Uloric held per nephrology. Now on colchicine. Needs to follow up with rheumatology---pt agreed  2. Continue fentanyl patch q48 hours 3. Continue voltaren gel for topical relief.  4. Percocet for breakthrough pain  10/325 one q6 prn #120  5. Custom compounded cream: 20% ketoprofen, 4%ketamine, 5% gabapentin, 5% amitripytiline to be applied TID to both feet 6. 15 minutes of face to face patient care time were spent during this visit. All questions were encouraged and answered. I will see him back in about 2 months.

## 2015-03-24 NOTE — Patient Instructions (Signed)
PLEASE CALL ME WITH ANY PROBLEMS OR QUESTIONS (#[phone]). HAVE A HAPPY HOLIDAY SEASON!!!    

## 2015-04-07 DIAGNOSIS — Z992 Dependence on renal dialysis: Secondary | ICD-10-CM | POA: Diagnosis not present

## 2015-04-07 DIAGNOSIS — N186 End stage renal disease: Secondary | ICD-10-CM | POA: Diagnosis not present

## 2015-04-19 ENCOUNTER — Ambulatory Visit: Payer: Medicare Other | Admitting: Physical Medicine & Rehabilitation

## 2015-05-08 DIAGNOSIS — Z992 Dependence on renal dialysis: Secondary | ICD-10-CM | POA: Diagnosis not present

## 2015-05-08 DIAGNOSIS — N186 End stage renal disease: Secondary | ICD-10-CM | POA: Diagnosis not present

## 2015-05-11 ENCOUNTER — Other Ambulatory Visit: Payer: Self-pay | Admitting: Registered Nurse

## 2015-05-25 ENCOUNTER — Encounter: Payer: BLUE CROSS/BLUE SHIELD | Admitting: Registered Nurse

## 2015-06-03 ENCOUNTER — Encounter: Payer: Self-pay | Admitting: Registered Nurse

## 2015-06-03 ENCOUNTER — Encounter: Payer: BLUE CROSS/BLUE SHIELD | Attending: Physical Medicine & Rehabilitation | Admitting: Registered Nurse

## 2015-06-03 VITALS — BP 147/87 | HR 73 | Resp 14

## 2015-06-03 DIAGNOSIS — Z79899 Other long term (current) drug therapy: Secondary | ICD-10-CM | POA: Diagnosis not present

## 2015-06-03 DIAGNOSIS — M722 Plantar fascial fibromatosis: Secondary | ICD-10-CM | POA: Diagnosis not present

## 2015-06-03 DIAGNOSIS — N185 Chronic kidney disease, stage 5: Secondary | ICD-10-CM | POA: Insufficient documentation

## 2015-06-03 DIAGNOSIS — M1009 Idiopathic gout, multiple sites: Secondary | ICD-10-CM | POA: Diagnosis not present

## 2015-06-03 DIAGNOSIS — G894 Chronic pain syndrome: Secondary | ICD-10-CM | POA: Diagnosis not present

## 2015-06-03 DIAGNOSIS — Z5181 Encounter for therapeutic drug level monitoring: Secondary | ICD-10-CM | POA: Diagnosis not present

## 2015-06-03 MED ORDER — FENTANYL 25 MCG/HR TD PT72: 25.0000 ug | MEDICATED_PATCH | TRANSDERMAL | Status: DC

## 2015-06-03 MED ORDER — OXYCODONE-ACETAMINOPHEN 10-325 MG PO TABS
1.0000 | ORAL_TABLET | Freq: Four times a day (QID) | ORAL | Status: DC | PRN
Start: 2015-06-03 — End: 2015-08-03

## 2015-06-03 MED ORDER — OXYCODONE-ACETAMINOPHEN 10-325 MG PO TABS: 1.0000 | ORAL_TABLET | Freq: Four times a day (QID) | ORAL | Status: DC | PRN

## 2015-06-03 NOTE — Progress Notes (Signed)
Subjective:    Patient ID: James Kim, male    DOB: 08-09-1975, 40 y.o.   MRN: [id number]  HPI: [name] is a 40 year old male who returns for follow up for chronic pain and medication refill. He says his pain is located in his bilateral lower extremities, bilateral heels and bilateral feet. He rates his pain 6. His current exercise regime is walking daily for short distances.  Pain Inventory Average Pain 9 Pain Right Now 6 My pain is sharp, stabbing and aching  In the last 24 hours, has pain interfered with the following? General activity 10 Relation with others 8 Enjoyment of life 9 What TIME of day is your pain at its worst? morning, evening  Sleep (in general) Poor  Pain is worse with: walking, standing and some activites Pain improves with: rest, heat/ice, medication and injections Relief from Meds: 4  Mobility walk without assistance how many minutes can you walk? 15 ability to climb steps?  yes do you drive?  yes Do you have any goals in this area?  yes  Function disabled: date disabled . I need assistance with the following:  household duties Do you have any goals in this area?  yes  Neuro/Psych trouble walking anxiety  Prior Studies Any changes since last visit?  no  Physicians involved in your care Any changes since last visit?  no   Family History  Problem Relation Age of Onset  . Stroke Mother   . Diabetes Father    Social History   Social History  . Marital Status: Married    Spouse Name: N/A  . Number of Children: N/A  . Years of Education: N/A   Social History Main Topics  . Smoking status: Never Smoker   . Smokeless tobacco: Current User  . Alcohol Use: Yes  . Drug Use: No  . Sexual Activity: Not Asked   Other Topics Concern  . None   Social History Narrative   History reviewed. No pertinent past surgical history. Past Medical History  Diagnosis Date  . Chronic kidney disease     Born with kidney defect    . Arthritis   . Asthma   . Hypertension    BP 147/87 mmHg  Pulse 73  Resp 14  SpO2 96%  Opioid Risk Score:   Fall Risk Score:  `1  Depression screen PHQ 2/9  Depression screen [facility] 2/9 01/25/2015 08/10/2014  Decreased Interest 0 2  Down, Depressed, Hopeless 0 1  PHQ - 2 Score 0 3  Altered sleeping - 3  Tired, decreased energy - 3  Change in appetite - 3  Feeling bad or failure about yourself  - 1  Trouble concentrating - 2  Moving slowly or fidgety/restless - 0  Suicidal thoughts - 0  PHQ-9 Score - 15     Review of Systems  Constitutional: Positive for appetite change.  Respiratory: Positive for apnea and shortness of breath.   Gastrointestinal: Positive for nausea and vomiting.  All other systems reviewed and are negative.      Objective:   Physical Exam  Constitutional: He is oriented to person, place, and time. He appears well-developed and well-nourished.  HENT:  Head: Normocephalic and atraumatic.  Neck: Normal range of motion. Neck supple.  Cardiovascular: Normal rate and regular rhythm.   Pulmonary/Chest: Effort normal and breath sounds normal.  Musculoskeletal:  Normal Muscle Bulk and Muscle Testing Reveals: Upper Extremities: Full ROM and Muscle Strength 5/5 Lower Extremities:  Full ROM and Muscle Strength 5/5 Arises from chair slowly Antalgic Gait  Neurological: He is alert and oriented to person, place, and time.  Skin: Skin is warm and dry.  Psychiatric: He has a normal mood and affect.  Nursing note and vitals reviewed.         Assessment & Plan:  1.Chronic foot pain with history of plantar fasciitis: Refilled: Oxycodone 10/325 mg one tablet every 6 hours as needed #120 and Fentanyl 25 mcg one patch every three days #10. Second script given for the following month. 2.Chronic Kidney Disease stage V:On Peritoneal Dialysis/ Nephrology Following. 3. Anxiety: Continue Xanax 0.5 mg BID/prn  4. Papules: F/U with Dermatology  20 minutes of face to  face patient care time was spent during this visit. All questions were encouraged and answered.  .  F/U in 1 month

## 2015-06-08 DIAGNOSIS — Z992 Dependence on renal dialysis: Secondary | ICD-10-CM | POA: Diagnosis not present

## 2015-06-08 DIAGNOSIS — N186 End stage renal disease: Secondary | ICD-10-CM | POA: Diagnosis not present

## 2015-06-09 LAB — TOXASSURE SELECT,+ANTIDEPR,UR: PDF: 0

## 2015-06-16 NOTE — Progress Notes (Signed)
Urine drug screen for this encounter is consistent for prescribed medication 

## 2015-06-25 ENCOUNTER — Other Ambulatory Visit: Payer: Self-pay | Admitting: Registered Nurse

## 2015-06-28 DIAGNOSIS — R413 Other amnesia: Secondary | ICD-10-CM | POA: Diagnosis not present

## 2015-06-28 DIAGNOSIS — I10 Essential (primary) hypertension: Secondary | ICD-10-CM | POA: Diagnosis not present

## 2015-06-28 DIAGNOSIS — E559 Vitamin D deficiency, unspecified: Secondary | ICD-10-CM | POA: Diagnosis not present

## 2015-06-28 DIAGNOSIS — M792 Neuralgia and neuritis, unspecified: Secondary | ICD-10-CM | POA: Diagnosis not present

## 2015-06-28 DIAGNOSIS — F418 Other specified anxiety disorders: Secondary | ICD-10-CM | POA: Diagnosis not present

## 2015-06-28 DIAGNOSIS — N186 End stage renal disease: Secondary | ICD-10-CM | POA: Diagnosis not present

## 2015-06-28 DIAGNOSIS — F3289 Other specified depressive episodes: Secondary | ICD-10-CM | POA: Diagnosis not present

## 2015-06-28 DIAGNOSIS — G500 Trigeminal neuralgia: Secondary | ICD-10-CM | POA: Diagnosis not present

## 2015-07-06 DIAGNOSIS — Z992 Dependence on renal dialysis: Secondary | ICD-10-CM | POA: Diagnosis not present

## 2015-07-06 DIAGNOSIS — N186 End stage renal disease: Secondary | ICD-10-CM | POA: Diagnosis not present

## 2015-07-20 ENCOUNTER — Other Ambulatory Visit: Payer: Self-pay | Admitting: Registered Nurse

## 2015-07-22 DIAGNOSIS — F5232 Male orgasmic disorder: Secondary | ICD-10-CM | POA: Diagnosis not present

## 2015-07-29 ENCOUNTER — Ambulatory Visit: Payer: BLUE CROSS/BLUE SHIELD | Admitting: Registered Nurse

## 2015-08-03 ENCOUNTER — Encounter: Payer: BLUE CROSS/BLUE SHIELD | Attending: Physical Medicine & Rehabilitation | Admitting: Registered Nurse

## 2015-08-03 ENCOUNTER — Encounter: Payer: Self-pay | Admitting: Registered Nurse

## 2015-08-03 VITALS — BP 133/77 | HR 91

## 2015-08-03 DIAGNOSIS — G894 Chronic pain syndrome: Secondary | ICD-10-CM

## 2015-08-03 DIAGNOSIS — Z79899 Other long term (current) drug therapy: Secondary | ICD-10-CM

## 2015-08-03 DIAGNOSIS — M722 Plantar fascial fibromatosis: Secondary | ICD-10-CM | POA: Diagnosis not present

## 2015-08-03 DIAGNOSIS — N185 Chronic kidney disease, stage 5: Secondary | ICD-10-CM | POA: Diagnosis not present

## 2015-08-03 DIAGNOSIS — N184 Chronic kidney disease, stage 4 (severe): Secondary | ICD-10-CM

## 2015-08-03 DIAGNOSIS — Z5181 Encounter for therapeutic drug level monitoring: Secondary | ICD-10-CM

## 2015-08-03 DIAGNOSIS — M1009 Idiopathic gout, multiple sites: Secondary | ICD-10-CM | POA: Diagnosis not present

## 2015-08-03 MED ORDER — OXYCODONE-ACETAMINOPHEN 10-325 MG PO TABS: 1.0000 | ORAL_TABLET | Freq: Four times a day (QID) | ORAL | Status: DC | PRN

## 2015-08-03 MED ORDER — FENTANYL 25 MCG/HR TD PT72: 25.0000 ug | MEDICATED_PATCH | TRANSDERMAL | Status: DC

## 2015-08-03 NOTE — Progress Notes (Signed)
Subjective:    Patient ID: James Kim, male    DOB: 01-05-1976, 40 y.o.   MRN: [id number]  HPI: [name] is a 40 year old male who returns for follow up for chronic pain and medication refill. He states his pain is located in his bilateral lower extremities, bilateral heels and bilateral feet. He rates his pain 6. His current exercise regime is walking daily for short distances. [name] has been encouraged to find a PCP and make an appointment with Rheumatoloy, he verbalizes understanding.  Pain Inventory Average Pain 9 Pain Right Now 6 My pain is sharp, stabbing and aching  In the last 24 hours, has pain interfered with the following? General activity 7 Relation with others 5 Enjoyment of life 8 What TIME of day is your pain at its worst? morning and night Sleep (in general) Poor  Pain is worse with: walking, standing and some activites Pain improves with: rest, heat/ice, medication and injections Relief from Meds: 6  Mobility walk without assistance how many minutes can you walk? 20 ability to climb steps?  yes do you drive?  yes Do you have any goals in this area?  yes  Function disabled: date disabled .  Neuro/Psych trouble walking anxiety  Prior Studies Any changes since last visit?  no  Physicians involved in your care Any changes since last visit?  no   Family History  Problem Relation Age of Onset  . Stroke Mother   . Diabetes Father    Social History   Social History  . Marital Status: Married    Spouse Name: N/A  . Number of Children: N/A  . Years of Education: N/A   Social History Main Topics  . Smoking status: Never Smoker   . Smokeless tobacco: Current User  . Alcohol Use: Yes  . Drug Use: No  . Sexual Activity: Not Asked   Other Topics Concern  . None   Social History Narrative   History reviewed. No pertinent past surgical history. Past Medical History  Diagnosis Date  . Chronic kidney disease     Born  with kidney defect  . Arthritis   . Asthma   . Hypertension    BP 133/77 mmHg  Pulse 91  SpO2 100%  Opioid Risk Score:   Fall Risk Score:  `1  Depression screen PHQ 2/9  Depression screen [facility] 2/9 08/03/2015 01/25/2015 08/10/2014  Decreased Interest 1 0 2  Down, Depressed, Hopeless 1 0 1  PHQ - 2 Score 2 0 3  Altered sleeping 2 - 3  Tired, decreased energy 1 - 3  Change in appetite 1 - 3  Feeling bad or failure about yourself  0 - 1  Trouble concentrating 0 - 2  Moving slowly or fidgety/restless 0 - 0  Suicidal thoughts 0 - 0  PHQ-9 Score 6 - 15  Difficult doing work/chores Not difficult at all - -     Review of Systems     Objective:   Physical Exam  Constitutional: He is oriented to person, place, and time. He appears well-developed and well-nourished.  HENT:  Head: Normocephalic and atraumatic.  Neck: Normal range of motion. Neck supple.  Cardiovascular: Normal rate and regular rhythm.   Pulmonary/Chest: Effort normal and breath sounds normal.  Musculoskeletal:  Normal Muscle Bulk and Muscle Testing Reveals: Upper Extremities: Full ROM and Muscle Strength 5/5 Lower Extremities: Full ROM and Muscle Strength 5/5 Arises from chair with ease Narrow Based gait  Neurological: He is alert and oriented to person, place, and time.  Skin: Skin is warm and dry.  Psychiatric: He has a normal mood and affect.  Nursing note and vitals reviewed.         Assessment & Plan:  1.Chronic foot pain with history of plantar fasciitis: Refilled: Oxycodone 10/325 mg one tablet every 6 hours as needed #120 and Fentanyl 25 mcg one patch every other day  #15. Second script given for the following month. 2.Chronic Kidney Disease stage V:On Peritoneal Dialysis/ Nephrology Following. 3. Anxiety: Continue Xanax 0.5 mg BID/prn   20 minutes of face to face patient care time was spent during this visit. All questions were encouraged and answered.  .  F/U in 1 month

## 2015-08-13 ENCOUNTER — Telehealth: Payer: Self-pay

## 2015-08-13 NOTE — Telephone Encounter (Signed)
Pt called requesting refills on Xanax. Sybil called in Xanax on 07/20/15 with 2 additional refills. Left message to advise pt to call pharmacy.

## 2015-08-26 ENCOUNTER — Other Ambulatory Visit: Payer: Self-pay

## 2015-08-26 MED ORDER — ALPRAZOLAM 0.5 MG PO TABS: ORAL_TABLET | ORAL | Status: DC

## 2015-08-26 NOTE — Telephone Encounter (Signed)
Pt's pharmacy called, stated that they did not receive the 07/20/15 reorder of Alprazolam for pt. Called in new order with two additional refills like before.

## 2015-08-27 DIAGNOSIS — N186 End stage renal disease: Secondary | ICD-10-CM | POA: Diagnosis not present

## 2015-08-27 DIAGNOSIS — M109 Gout, unspecified: Secondary | ICD-10-CM | POA: Diagnosis not present

## 2015-08-27 DIAGNOSIS — I120 Hypertensive chronic kidney disease with stage 5 chronic kidney disease or end stage renal disease: Secondary | ICD-10-CM | POA: Diagnosis not present

## 2015-08-27 DIAGNOSIS — Z992 Dependence on renal dialysis: Secondary | ICD-10-CM | POA: Diagnosis not present

## 2015-08-27 DIAGNOSIS — M069 Rheumatoid arthritis, unspecified: Secondary | ICD-10-CM | POA: Diagnosis not present

## 2015-08-27 DIAGNOSIS — Z5320 Procedure and treatment not carried out because of patient's decision for unspecified reasons: Secondary | ICD-10-CM | POA: Diagnosis not present

## 2015-08-27 DIAGNOSIS — Z882 Allergy status to sulfonamides status: Secondary | ICD-10-CM | POA: Diagnosis not present

## 2015-08-27 DIAGNOSIS — E785 Hyperlipidemia, unspecified: Secondary | ICD-10-CM | POA: Diagnosis not present

## 2015-08-30 ENCOUNTER — Encounter: Payer: Self-pay | Admitting: Physical Medicine & Rehabilitation

## 2015-08-30 ENCOUNTER — Encounter
Payer: BLUE CROSS/BLUE SHIELD | Attending: Physical Medicine & Rehabilitation | Admitting: Physical Medicine & Rehabilitation

## 2015-08-30 VITALS — BP 137/74 | HR 67 | Resp 14

## 2015-08-30 DIAGNOSIS — Z5181 Encounter for therapeutic drug level monitoring: Secondary | ICD-10-CM | POA: Diagnosis not present

## 2015-08-30 DIAGNOSIS — M792 Neuralgia and neuritis, unspecified: Secondary | ICD-10-CM | POA: Diagnosis not present

## 2015-08-30 DIAGNOSIS — M722 Plantar fascial fibromatosis: Secondary | ICD-10-CM

## 2015-08-30 DIAGNOSIS — M1009 Idiopathic gout, multiple sites: Secondary | ICD-10-CM | POA: Insufficient documentation

## 2015-08-30 DIAGNOSIS — N185 Chronic kidney disease, stage 5: Secondary | ICD-10-CM | POA: Insufficient documentation

## 2015-08-30 DIAGNOSIS — Z79899 Other long term (current) drug therapy: Secondary | ICD-10-CM | POA: Insufficient documentation

## 2015-08-30 DIAGNOSIS — M109 Gout, unspecified: Secondary | ICD-10-CM

## 2015-08-30 MED ORDER — PREGABALIN 25 MG PO CAPS: 25.0000 mg | ORAL_CAPSULE | Freq: Two times a day (BID) | ORAL | Status: DC

## 2015-08-30 MED ORDER — FENTANYL 25 MCG/HR TD PT72: 25.0000 ug | MEDICATED_PATCH | TRANSDERMAL | Status: DC

## 2015-08-30 MED ORDER — OXYCODONE-ACETAMINOPHEN 10-325 MG PO TABS: 1.0000 | ORAL_TABLET | Freq: Four times a day (QID) | ORAL | Status: DC | PRN

## 2015-08-30 NOTE — Patient Instructions (Addendum)
PLEASE CALL ME WITH ANY PROBLEMS OR QUESTIONS (#[phone]).     ORAL SURGEON IN [place] TO SEE YOU REGARDING JAW PAIN?    STOP APTIOM

## 2015-08-30 NOTE — Progress Notes (Signed)
Subjective:    Patient ID: James Kim, male    DOB: 1975/07/04, 40 y.o.   MRN: [id number]  HPI  [place] is here in follow up of his chronic pain. Since i last saw him he has developed right sided jaw pain. He had a dental work up which was negative. He was referred to a neurologist in [place] who diagnosed him with trigeminal neuralgia. MRI of the brain was negative. He was placed on aptiom which has not provided relief. He has difficulty with chewing. He cannot toleate cold or hot food/liquids. He denies any facial pain or hypersentivity. His heels continue to hurt, he feels that his gout is under better control. He is on peritoneal dialysis currently each night. He appears to be making less urine.   Pain Inventory Average Pain 8 Pain Right Now 9 My pain is sharp and aching  In the last 24 hours, has pain interfered with the following? General activity 10 Relation with others 7 Enjoyment of life 10 What TIME of day is your pain at its worst? morning, evening, night Sleep (in general) Poor  Pain is worse with: walking, standing and some activites Pain improves with: rest, heat/ice, medication and injections Relief from Meds: 4  Mobility walk without assistance how many minutes can you walk? 10-15 ability to climb steps?  yes do you drive?  yes Do you have any goals in this area?  yes  Function disabled: date disabled . I need assistance with the following:  household duties Do you have any goals in this area?  yes  Neuro/Psych trouble walking spasms anxiety  Prior Studies Any changes since last visit?  no  Physicians involved in your care Any changes since last visit?  yes   Family History  Problem Relation Age of Onset  . Stroke Mother   . Diabetes Father    Social History   Social History  . Marital Status: Married    Spouse Name: N/A  . Number of Children: N/A  . Years of Education: N/A   Social History Main Topics  . Smoking status: Never  Smoker   . Smokeless tobacco: Current User  . Alcohol Use: Yes  . Drug Use: No  . Sexual Activity: Not Asked   Other Topics Concern  . None   Social History Narrative   History reviewed. No pertinent past surgical history. Past Medical History  Diagnosis Date  . Chronic kidney disease     Born with kidney defect  . Arthritis   . Asthma   . Hypertension    BP 137/74 mmHg  Pulse 67  Resp 14  SpO2 100%  Opioid Risk Score:   Fall Risk Score:  `1  Depression screen PHQ 2/9  Depression screen [facility] 2/9 08/03/2015 01/25/2015 08/10/2014  Decreased Interest 1 0 2  Down, Depressed, Hopeless 1 0 1  PHQ - 2 Score 2 0 3  Altered sleeping 2 - 3  Tired, decreased energy 1 - 3  Change in appetite 1 - 3  Feeling bad or failure about yourself  0 - 1  Trouble concentrating 0 - 2  Moving slowly or fidgety/restless 0 - 0  Suicidal thoughts 0 - 0  PHQ-9 Score 6 - 15  Difficult doing work/chores Not difficult at all - -    Review of Systems  Constitutional: Positive for appetite change and unexpected weight change.  All other systems reviewed and are negative.      Objective:   Physical Exam  General: Alert and oriented x 3, No apparent distress. Obese, weight unchanged.  HEENT: Head is normocephalic, atraumatic, PERRLA, EOMI, sclera anicteric, oral mucosa pink and moist, dentition intact, ext ear canals clear, He has pain with palpation of the right mandible. The skin of his face is not sensitive to touch. He has mild TMJ tenderness on the right.  Neck: Supple without JVD or lymphadenopathy  Heart: Reg rate and rhythm. No murmurs rubs or gallops  Chest: CTA bilaterally without wheezes, rales, or rhonchi; no distress  Abdomen: Soft, non-tender, non-distended, bowel sounds positive.  Extremities: No clubbing, cyanosis, or edema. Pulses are 2+  Skin: multiple maculopapular marks on feet and shins---states they are from flea bites and skin reaction. .  Neuro: Pt is cognitively appropriate  with normal insight, memory, and awareness. Cranial nerves 2-12 are intact. Sensory exam is normal even in the feet which are sensitive to touch due to pain. Reflexes are 2+ in all 4's. Fine motor coordination is intact. No tremors. Motor function is grossly 5/5.  Musculoskeletal: No joint deformities seen in either shoulder, elbow, or hand. No knee swelling or abnormalities are appreciated. Bilateral feet continue to be painful with palpation along both calcanei, metatarsal heads, both MTP's, medial arch, and lateral weight bearing area of either feet once again. Pain intensity appears to be a little better as a whole. His right medial arch remains the most sensitive.. No redness or warmth. There was no evidence of breakdown. He walks with significant antalgia on either foot, left moreso than right still. Heels are somewhat tender to palpation.   Psych: Pt's affect is appropriate. Pt is cooperative. He is a little flat but pleasant   ASSESSMENT:  1. Chronic foot pain with history of plantar fasciitis bilaterally, right greater than left  2. Gouty arthritis primarily affecting the feet  3. ?Rheumatoid arthritis primarily affecting the feet  4. 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 Obesity.  6. Right facial pain. ?Trigeminal neuralgia/inferior alveolar branch? He had dental work done, but it involved the opposite side of his jaw   Plan:  1. DC aptiom as it's not helping. Begin low dose lyrica 25mg  bid---asked him to check with nephrologist regarding the medicine as well. I asked him to take one cap in the am and one cap at bedtime daily. His PD is at night.    -he may need an oral surgery referral to assess this pain. ?nerve block? 2. Continue fentanyl patch q48 hours, .  3. Continue voltaren gel for topical relief.  4. Percocet for breakthrough pain 10/325 one q6 prn #120  5. Custom compounded cream: 20% ketoprofen, 4%ketamine, 5% gabapentin, 5% amitripytiline to be  applied TID to both feet  6. 30 minutes of face to face patient care time were spent during this visit. All questions were encouraged and answered. I will see him back in about 2 months.           Assessment & Plan:

## 2015-09-27 ENCOUNTER — Encounter: Payer: BLUE CROSS/BLUE SHIELD | Attending: Physical Medicine & Rehabilitation | Admitting: Registered Nurse

## 2015-09-27 ENCOUNTER — Encounter: Payer: Self-pay | Admitting: Registered Nurse

## 2015-09-27 ENCOUNTER — Ambulatory Visit: Payer: BLUE CROSS/BLUE SHIELD | Admitting: Registered Nurse

## 2015-09-27 VITALS — BP 110/58 | HR 62 | Resp 14

## 2015-09-27 DIAGNOSIS — M722 Plantar fascial fibromatosis: Secondary | ICD-10-CM | POA: Insufficient documentation

## 2015-09-27 DIAGNOSIS — M109 Gout, unspecified: Secondary | ICD-10-CM

## 2015-09-27 DIAGNOSIS — Z5181 Encounter for therapeutic drug level monitoring: Secondary | ICD-10-CM | POA: Insufficient documentation

## 2015-09-27 DIAGNOSIS — G894 Chronic pain syndrome: Secondary | ICD-10-CM | POA: Diagnosis not present

## 2015-09-27 DIAGNOSIS — N184 Chronic kidney disease, stage 4 (severe): Secondary | ICD-10-CM

## 2015-09-27 DIAGNOSIS — N185 Chronic kidney disease, stage 5: Secondary | ICD-10-CM | POA: Insufficient documentation

## 2015-09-27 DIAGNOSIS — Z79899 Other long term (current) drug therapy: Secondary | ICD-10-CM | POA: Diagnosis not present

## 2015-09-27 DIAGNOSIS — M1009 Idiopathic gout, multiple sites: Secondary | ICD-10-CM | POA: Insufficient documentation

## 2015-09-27 DIAGNOSIS — M792 Neuralgia and neuritis, unspecified: Secondary | ICD-10-CM

## 2015-09-27 MED ORDER — FENTANYL 25 MCG/HR TD PT72: 25.0000 ug | MEDICATED_PATCH | TRANSDERMAL | Status: DC

## 2015-09-27 MED ORDER — OXYCODONE-ACETAMINOPHEN 10-325 MG PO TABS: 1.0000 | ORAL_TABLET | Freq: Four times a day (QID) | ORAL | Status: DC | PRN

## 2015-09-27 NOTE — Progress Notes (Signed)
Subjective:    Patient ID: James Kim, male    DOB: [birth year], 40 y.o.   MRN: [id number]  HPI: James Kim is a 40 year old male who returns for follow up for chronic pain and medication refill. He states his pain is located in his right jaw pain, bilateral lower extremities, bilateral heels and bilateral feet. He rates his pain 6. His current exercise regime is walking daily for 20 minutes every other day. James Kim arrived hypotensive and blood pressure rechecked several times. James Kim states he received the wrong dialysate solution, and his blood pressure has been running low. I placed a call to [facility] and spoke to the Peritoneal Nurse Belva Bertin [phone]. She states they seen James Kim today and he is aware to use the 1.5% Dialysate Solutions, he verbalizes understanding. Also instructed to call the [facility] Nurse with any questions, he verbalizes understanding.  Pain Inventory Average Pain 8 Pain Right Now 6 My pain is sharp, burning, stabbing and aching  In the last 24 hours, has pain interfered with the following? General activity 6 Relation with others 6 Enjoyment of life 6 What TIME of day is your pain at its worst? morning, evening  Sleep (in general) NA  Pain is worse with: walking and bending Pain improves with: NA Relief from Meds: NA  Mobility how many minutes can you walk? 15-20 ability to climb steps?  yes do you drive?  yes Do you have any goals in this area?  yes  Function disabled: date disabled NA I need assistance with the following:  meal prep and household duties Do you have any goals in this area?  yes  Neuro/Psych No problems in this area  Prior Studies Any changes since last visit?  no  Physicians involved in your care Neurologist . Rheumatologist .   Family History  Problem Relation Age of Onset  . Stroke Mother   . Diabetes Father    Social History   Social History  . Marital Status: Married   Spouse Name: N/A  . Number of Children: N/A  . Years of Education: N/A   Social History Main Topics  . Smoking status: Never Smoker   . Smokeless tobacco: Current User  . Alcohol Use: Yes  . Drug Use: No  . Sexual Activity: Not Asked   Other Topics Concern  . None   Social History Narrative   History reviewed. No pertinent past surgical history. Past Medical History  Diagnosis Date  . Chronic kidney disease     Born with kidney defect  . Arthritis   . Asthma   . Hypertension    BP 88/58 mmHg  Pulse 62  Resp 14  SpO2 97%  Opioid Risk Score:   Fall Risk Score:  `1  Depression screen PHQ 2/9  Depression screen [facility] 2/9 08/03/2015 01/25/2015 08/10/2014  Decreased Interest 1 0 2  Down, Depressed, Hopeless 1 0 1  PHQ - 2 Score 2 0 3  Altered sleeping 2 - 3  Tired, decreased energy 1 - 3  Change in appetite 1 - 3  Feeling bad or failure about yourself  0 - 1  Trouble concentrating 0 - 2  Moving slowly or fidgety/restless 0 - 0  Suicidal thoughts 0 - 0  PHQ-9 Score 6 - 15  Difficult doing work/chores Not difficult at all - -     Review of Systems  All other systems reviewed and are negative.  Objective:   Physical Exam  Constitutional: He is oriented to person, place, and time. He appears well-developed and well-nourished.  HENT:  Head: Normocephalic and atraumatic.  Neck: Normal range of motion. Neck supple.  Cardiovascular: Normal rate and regular rhythm.   Pulmonary/Chest: Effort normal and breath sounds normal.  Musculoskeletal:  Normal Muscle Bulk and Muscle Testing Reveals: Upper Extremities: Full ROM and Muscle Strength: 5/5 Lower Extremities: Full ROM and Muscle Strength 5/5 Arises from chair slowly Antalgic Gait  Neurological: He is alert and oriented to person, place, and time.  Skin: Skin is warm and dry.  Psychiatric: He has a normal mood and affect.  Nursing note and vitals reviewed.         Assessment & Plan:  1.Chronic foot pain  with history of plantar fasciitis: Refilled: Oxycodone 10/325 mg one tablet every 6 hours as needed #120 and Fentanyl 25 mcg one patch every other day #15. Second script given for the following month. 2.Chronic Kidney Disease stage V:On Peritoneal Dialysis/ Nephrology Following. 3. Anxiety: Continue Xanax 0.5 mg BID/prn   20 minutes of face to face patient care time was spent during this visit. All questions were encouraged and answered.  .  F/U in 1 month

## 2015-09-29 ENCOUNTER — Ambulatory Visit: Payer: BLUE CROSS/BLUE SHIELD | Admitting: Registered Nurse

## 2015-10-02 LAB — TOXASSURE SELECT,+ANTIDEPR,UR

## 2015-10-07 NOTE — Progress Notes (Signed)
Urine drug screen for this encounter is consistent for prescribed medication 

## 2015-11-24 ENCOUNTER — Ambulatory Visit: Payer: BLUE CROSS/BLUE SHIELD | Admitting: Physical Medicine & Rehabilitation

## 2015-11-24 ENCOUNTER — Encounter: Payer: Self-pay | Admitting: Physical Medicine & Rehabilitation

## 2015-11-24 ENCOUNTER — Encounter
Payer: BLUE CROSS/BLUE SHIELD | Attending: Physical Medicine & Rehabilitation | Admitting: Physical Medicine & Rehabilitation

## 2015-11-24 VITALS — BP 123/78 | HR 82

## 2015-11-24 DIAGNOSIS — Z5181 Encounter for therapeutic drug level monitoring: Secondary | ICD-10-CM

## 2015-11-24 DIAGNOSIS — M1009 Idiopathic gout, multiple sites: Secondary | ICD-10-CM | POA: Insufficient documentation

## 2015-11-24 DIAGNOSIS — Z79899 Other long term (current) drug therapy: Secondary | ICD-10-CM | POA: Diagnosis not present

## 2015-11-24 DIAGNOSIS — N185 Chronic kidney disease, stage 5: Secondary | ICD-10-CM | POA: Diagnosis not present

## 2015-11-24 DIAGNOSIS — M05779 Rheumatoid arthritis with rheumatoid factor of unspecified ankle and foot without organ or systems involvement: Secondary | ICD-10-CM | POA: Diagnosis not present

## 2015-11-24 DIAGNOSIS — M722 Plantar fascial fibromatosis: Secondary | ICD-10-CM | POA: Diagnosis not present

## 2015-11-24 DIAGNOSIS — M792 Neuralgia and neuritis, unspecified: Secondary | ICD-10-CM

## 2015-11-24 MED ORDER — PREGABALIN 25 MG PO CAPS: 25.0000 mg | ORAL_CAPSULE | Freq: Two times a day (BID) | ORAL | Status: DC

## 2015-11-24 MED ORDER — NONFORMULARY OR COMPOUNDED ITEM: 60.0000 g | Freq: Three times a day (TID) | Status: DC

## 2015-11-24 MED ORDER — ALPRAZOLAM 0.5 MG PO TABS: ORAL_TABLET | ORAL | Status: DC

## 2015-11-24 MED ORDER — OXYCODONE-ACETAMINOPHEN 10-325 MG PO TABS: 1.0000 | ORAL_TABLET | Freq: Four times a day (QID) | ORAL | Status: DC | PRN

## 2015-11-24 MED ORDER — OXYCODONE ER 18 MG PO C12A: 1.0000 | EXTENDED_RELEASE_CAPSULE | Freq: Two times a day (BID) | ORAL | Status: DC

## 2015-11-24 NOTE — Progress Notes (Signed)
Subjective:    Patient ID: James Kim, male    DOB: 1975-06-25, 40 y.o.   MRN: [id number]  HPI   [name] is here in follow up of his chronic pain. He continues with PD for his renal failure and has had issues with edema in his LE's. He recently suffered from a gout flare also.  He feels that the fentanyl patches help but they are not sticking to his skin. The percocet is helpful for breakthrough pain.     Pain Inventory Average Pain 8 Pain Right Now 7 My pain is sharp, stabbing and aching  In the last 24 hours, has pain interfered with the following? General activity 8 Relation with others 8 Enjoyment of life 10 What TIME of day is your pain at its worst? morning and evening Sleep (in general) Poor  Pain is worse with: walking, standing and some activites Pain improves with: rest, heat/ice, medication and injections Relief from Meds: 4  Mobility walk without assistance how many minutes can you walk? 10-15 ability to climb steps?  yes do you drive?  yes Do you have any goals in this area?  yes  Function disabled: date disabled . I need assistance with the following:  household duties Do you have any goals in this area?  yes  Neuro/Psych trouble walking anxiety  Prior Studies Any changes since last visit?  no  Physicians involved in your care Any changes since last visit?  no   Family History  Problem Relation Age of Onset  . Stroke Mother   . Diabetes Father    Social History   Social History  . Marital Status: Married    Spouse Name: N/A  . Number of Children: N/A  . Years of Education: N/A   Social History Main Topics  . Smoking status: Never Smoker   . Smokeless tobacco: Current User  . Alcohol Use: Yes  . Drug Use: No  . Sexual Activity: Not Asked   Other Topics Concern  . None   Social History Narrative   History reviewed. No pertinent past surgical history. Past Medical History  Diagnosis Date  . 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 Arthritis   . Asthma   . Hypertension    BP 123/78 mmHg  Pulse 82  SpO2 99%  Opioid Risk Score:   Fall Risk Score:  `1  Depression screen PHQ 2/9  Depression screen [facility] 2/9 08/03/2015 01/25/2015 08/10/2014  Decreased Interest 1 0 2  Down, Depressed, Hopeless 1 0 1  PHQ - 2 Score 2 0 3  Altered sleeping 2 - 3  Tired, decreased energy 1 - 3  Change in appetite 1 - 3  Feeling bad or failure about yourself  0 - 1  Trouble concentrating 0 - 2  Moving slowly or fidgety/restless 0 - 0  Suicidal thoughts 0 - 0  PHQ-9 Score 6 - 15  Difficult doing work/chores Not difficult at all - -     Review of Systems  Constitutional: Negative.   HENT: Negative.   Eyes: Negative.   Respiratory: Negative.   Cardiovascular: Negative.   Gastrointestinal: Negative.   Endocrine: Negative.   Genitourinary: Negative.   Musculoskeletal: Positive for joint swelling.  Skin: Negative.   Allergic/Immunologic: Negative.   Neurological: Negative.   Hematological: Negative.   Psychiatric/Behavioral: Negative.        Objective:   Physical Exam  General: Alert and oriented x 3, No apparent distress. Obese,  weight unchanged.  HEENT: Head is normocephalic, atraumatic, PERRLA, EOMI, sclera anicteric, oral mucosa pink and moist, dentition intact, ext ear canals clear, He has pain with palpation of the right mandible. The skin of his face is not sensitive to touch. He has mild TMJ tenderness on the right.  Neck: Supple without JVD or lymphadenopathy  Heart: Reg rate and rhythm. No murmurs rubs or gallops  Chest: CTA bilaterally without wheezes, rales, or rhonchi; no distress  Abdomen: Soft, non-tender, non-distended, bowel sounds positive.  Extremities: No clubbing, cyanosis, or edema. Pulses are 2+  Skin: multiple maculopapular marks on feet and shins---states they are from flea bites and skin reaction. .  Neuro: Pt is cognitively appropriate with normal insight, memory, and  awareness. Cranial nerves 2-12 are intact. Sensory exam is normal even in the feet which are sensitive to touch due to pain. Reflexes are 2+ in all 4's. Fine motor coordination is intact. No tremors. Motor function is grossly 5/5.  Musculoskeletal: No joint deformities seen in either shoulder, elbow, or hand. No knee swelling or abnormalities are appreciated. Bilateral feet continue to be painful with palpation along both calcanei, metatarsal heads, both MTP's, medial arch, and lateral weight bearing area of either feet once again. Pain intensity appears to be a little better as a whole. His right medial arch remains the most sensitive.. No redness or warmth. There was no evidence of breakdown. He walks with significant antalgia on either foot, left moreso than right still. Heels are somewhat tender to palpation.  Psych: Pt's affect is appropriate. Pt is cooperative. He is a little flat but pleasant    ASSESSMENT:  1. Chronic foot pain with history of plantar fasciitis bilaterally, right greater than left  2. Gouty arthritis primarily affecting the feet  3. ?Rheumatoid arthritis primarily affecting the feet  4. 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 Obesity.  6. Right facial pain. ?Trigeminal neuralgia/inferior alveolar branch? Recent dental work done, but it involved the opposite side of his jaw    Plan:  1.Low dose lyrica 25mg  bid--- . His PD is at night.  2. Due to problems with adherence will dc fentanyl and begin trial of xtampza .  3. Continue voltaren gel for topical relief.  4. Percocet for breakthrough pain 10/325 one q6 prn #120 ---refilled 5. Custom compounded cream: 20% ketoprofen, increase to 8%ketamine, 5% gabapentin, 5% amitripytiline to be applied TID to both feet  6. 30 minutes of face to face patient care time were spent during this visit. All questions were encouraged and answered. I will see him back in about 2 months.

## 2015-11-24 NOTE — Patient Instructions (Signed)
PLEASE CALL ME WITH ANY PROBLEMS OR QUESTIONS ([phone])  

## 2015-12-15 NOTE — Telephone Encounter (Signed)
error 

## 2015-12-22 ENCOUNTER — Encounter: Payer: BLUE CROSS/BLUE SHIELD | Attending: Physical Medicine & Rehabilitation | Admitting: Registered Nurse

## 2015-12-22 ENCOUNTER — Encounter: Payer: Self-pay | Admitting: Registered Nurse

## 2015-12-22 VITALS — BP 103/66 | HR 87 | Resp 14

## 2015-12-22 DIAGNOSIS — N185 Chronic kidney disease, stage 5: Secondary | ICD-10-CM | POA: Insufficient documentation

## 2015-12-22 DIAGNOSIS — M1009 Idiopathic gout, multiple sites: Secondary | ICD-10-CM | POA: Insufficient documentation

## 2015-12-22 DIAGNOSIS — M109 Gout, unspecified: Secondary | ICD-10-CM

## 2015-12-22 DIAGNOSIS — Z5181 Encounter for therapeutic drug level monitoring: Secondary | ICD-10-CM | POA: Insufficient documentation

## 2015-12-22 DIAGNOSIS — G894 Chronic pain syndrome: Secondary | ICD-10-CM

## 2015-12-22 DIAGNOSIS — M722 Plantar fascial fibromatosis: Secondary | ICD-10-CM

## 2015-12-22 DIAGNOSIS — Z79899 Other long term (current) drug therapy: Secondary | ICD-10-CM | POA: Diagnosis present

## 2015-12-22 MED ORDER — OXYCODONE-ACETAMINOPHEN 10-325 MG PO TABS: 1.0000 | ORAL_TABLET | Freq: Four times a day (QID) | ORAL | 0 refills | Status: DC | PRN

## 2015-12-22 MED ORDER — OXYCODONE ER 27 MG PO C12A: 1.0000 | EXTENDED_RELEASE_CAPSULE | Freq: Two times a day (BID) | ORAL | 0 refills | Status: DC

## 2015-12-22 NOTE — Progress Notes (Signed)
Subjective:    Patient ID: James Kim, male    DOB: 06-21-1975, 40 y.o.   MRN: [id number]  HPI: [name] is a 40 year old male who returns for follow up for chronic pain and medication refill. He states his pain is located in his bilateral lower extremities, bilateral heels and bilateral feet. He rates his pain 4. Also states his pain is not controlled with the Xtampza and wanted to resume the Fentanyl patch, spoke with Dr. Riley Kill we will increase his Xtampza to 27 mg, he verbalizes understanding understanding. At this time he will continue the Xtampza 18mg  and on Sunday start the new prescription he verbalizes understanding.  His current exercise regime is walking daily for 20 minutes every other day. [name] instructed to call office in a week to evaluate the increase in [place], he verbalizes understanding.   Pain Inventory Average Pain 8 Pain Right Now 4 My pain is sharp, stabbing and aching  In the last 24 hours, has pain interfered with the following? General activity 7 Relation with others 5 Enjoyment of life 9 What TIME of day is your pain at its worst? morning, evening  Sleep (in general) Poor  Pain is worse with: walking, standing and some activites Pain improves with: rest, heat/ice, medication and injections Relief from Meds: 4  Mobility walk without assistance how many minutes can you walk? 10-15 ability to climb steps?  yes do you drive?  yes Do you have any goals in this area?  yes  Function disabled: date disabled . I need assistance with the following:  household duties  Neuro/Psych trouble walking anxiety  Prior Studies Any changes since last visit?  no  Physicians involved in your care Any changes since last visit?  no   Family History  Problem Relation Age of Onset  . Stroke Mother   . Diabetes Father    Social History   Social History  . Marital status: Married    Spouse name: N/A  . Number of children: N/A  .  Years of education: N/A   Social History Main Topics  . Smoking status: Never Smoker  . Smokeless tobacco: Current User  . Alcohol use Yes  . Drug use: No  . Sexual activity: Not Asked   Other Topics Concern  . None   Social History Narrative  . None   History reviewed. No pertinent surgical history. Past Medical History:  Diagnosis Date  . Arthritis   . Asthma   . 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 Hypertension    BP 103/66 (BP Location: Left Arm, Patient Position: Sitting, Cuff Size: Large)   Pulse 87   Resp 14   SpO2 97%   Opioid Risk Score:   Fall Risk Score:  `1  Depression screen PHQ 2/9  Depression screen [facility] 2/9 08/03/2015 01/25/2015 08/10/2014  Decreased Interest 1 0 2  Down, Depressed, Hopeless 1 0 1  PHQ - 2 Score 2 0 3  Altered sleeping 2 - 3  Tired, decreased energy 1 - 3  Change in appetite 1 - 3  Feeling bad or failure about yourself  0 - 1  Trouble concentrating 0 - 2  Moving slowly or fidgety/restless 0 - 0  Suicidal thoughts 0 - 0  PHQ-9 Score 6 - 15  Difficult doing work/chores Not difficult at all - -    Review of Systems  Constitutional: Positive for appetite change.  Eyes: Negative.  Respiratory: Positive for apnea and shortness of breath.   Cardiovascular: Positive for leg swelling.  Gastrointestinal: Positive for nausea.  Endocrine: Negative.   Genitourinary: Positive for difficulty urinating.  Musculoskeletal: Positive for gait problem.  Skin: Negative.   Allergic/Immunologic: Negative.   Hematological: Negative.   Psychiatric/Behavioral: The patient is nervous/anxious.   All other systems reviewed and are negative.      Objective:   Physical Exam  Constitutional: He is oriented to person, place, and time. He appears well-developed and well-nourished.  HENT:  Head: Normocephalic and atraumatic.  Neck: Normal range of motion. Neck supple.  Cardiovascular: Normal rate and regular rhythm.   Pulmonary/Chest:  Effort normal and breath sounds normal.  Musculoskeletal:  Normal Muscle Bulk and Muscle Testing Reveals: Upper Extremities: Full ROM and Muscle Strength 5/5 Lower Extremities: Full ROM and Muscle Strength 5/5 Arises from table with ease Narrow based Gait  Neurological: He is alert and oriented to person, place, and time.  Skin: Skin is warm and dry.  Psychiatric: He has a normal mood and affect.  Nursing note and vitals reviewed.         Assessment & Plan:  1.Chronic foot pain with history of plantar fasciitis: Refilled: Oxycodone 10/325 mg one tablet every 6 hours as needed #120 and Increased: Xtampza ER 27 mg one capsule every 12 hours #60. We will continue the opioid monitoring program this consists of, regular clinic visits, examinations, urine drug screen, pill counts as well as use of [place] Controlled Substance Reporting System. 2.Chronic Kidney Disease stage V:On Peritoneal Dialysis/ Nephrology Following. 3. Anxiety: Continue Xanax 0.5 mg BID/prn   20 minutes of face to face patient care time was spent during this visit. All questions were encouraged and answered.  .  F/U in 1 month

## 2016-01-19 ENCOUNTER — Encounter: Payer: BLUE CROSS/BLUE SHIELD | Attending: Physical Medicine & Rehabilitation | Admitting: Registered Nurse

## 2016-01-19 ENCOUNTER — Encounter: Payer: Self-pay | Admitting: Registered Nurse

## 2016-01-19 VITALS — BP 108/72 | HR 94 | Resp 14

## 2016-01-19 DIAGNOSIS — M05779 Rheumatoid arthritis with rheumatoid factor of unspecified ankle and foot without organ or systems involvement: Secondary | ICD-10-CM | POA: Diagnosis not present

## 2016-01-19 DIAGNOSIS — N185 Chronic kidney disease, stage 5: Secondary | ICD-10-CM | POA: Insufficient documentation

## 2016-01-19 DIAGNOSIS — Z79899 Other long term (current) drug therapy: Secondary | ICD-10-CM | POA: Diagnosis not present

## 2016-01-19 DIAGNOSIS — M109 Gout, unspecified: Secondary | ICD-10-CM

## 2016-01-19 DIAGNOSIS — Z5181 Encounter for therapeutic drug level monitoring: Secondary | ICD-10-CM | POA: Diagnosis not present

## 2016-01-19 DIAGNOSIS — M722 Plantar fascial fibromatosis: Secondary | ICD-10-CM | POA: Diagnosis not present

## 2016-01-19 DIAGNOSIS — M792 Neuralgia and neuritis, unspecified: Secondary | ICD-10-CM

## 2016-01-19 DIAGNOSIS — G894 Chronic pain syndrome: Secondary | ICD-10-CM | POA: Diagnosis not present

## 2016-01-19 DIAGNOSIS — M1009 Idiopathic gout, multiple sites: Secondary | ICD-10-CM | POA: Insufficient documentation

## 2016-01-19 MED ORDER — OXYCODONE ER 27 MG PO C12A: 1.0000 | EXTENDED_RELEASE_CAPSULE | Freq: Two times a day (BID) | ORAL | 0 refills | Status: DC

## 2016-01-19 MED ORDER — OXYCODONE-ACETAMINOPHEN 10-325 MG PO TABS: 1.0000 | ORAL_TABLET | Freq: Four times a day (QID) | ORAL | 0 refills | Status: DC | PRN

## 2016-01-19 MED ORDER — PREGABALIN 25 MG PO CAPS: 25.0000 mg | ORAL_CAPSULE | Freq: Two times a day (BID) | ORAL | 3 refills | Status: DC

## 2016-01-19 MED ORDER — ALPRAZOLAM 0.5 MG PO TABS: ORAL_TABLET | ORAL | 2 refills | Status: DC

## 2016-01-19 NOTE — Progress Notes (Signed)
Subjective:    Patient ID: James Kim, male    DOB: Sep 29, 1975, 40 y.o.   MRN: [id number]  HPI: [name] is a 40 year old male who returns for follow up for chronic pain and medication refill. He states his pain is located in his bilateral lower extremities, bilateral heels and bilateral feet. He rates his pain 5.  His current exercise regime is walking daily for 20 minutes every other day.  Pain Inventory Average Pain 8 Pain Right Now 5 My pain is sharp, stabbing and aching  In the last 24 hours, has pain interfered with the following? General activity 8 Relation with others 5 Enjoyment of life 9 What TIME of day is your pain at its worst? morning and evening Sleep (in general) Poor  Pain is worse with: walking, standing and some activites Pain improves with: rest, heat/ice, medication and injections Relief from Meds: 6  Mobility walk without assistance how many minutes can you walk? 10-13 ability to climb steps?  yes do you drive?  yes  Function I need assistance with the following:  household duties  Neuro/Psych trouble walking anxiety  Prior Studies Any changes since last visit?  no  Physicians involved in your care Any changes since last visit?  no   Family History  Problem Relation Age of Onset  . Stroke Mother   . Diabetes Father    Social History   Social History  . Marital status: Married    Spouse name: N/A  . Number of children: N/A  . Years of education: N/A   Social History Main Topics  . Smoking status: Never Smoker  . Smokeless tobacco: Current User  . Alcohol use Yes  . Drug use: No  . Sexual activity: Not Asked   Other Topics Concern  . None   Social History Narrative  . None   No past surgical history on file. Past Medical History:  Diagnosis Date  . Arthritis   . Asthma   . 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 Hypertension    BP 108/72 (BP Location: Right Arm, Patient Position:  Sitting, Cuff Size: Large)   Pulse 94   Resp 14   SpO2 98%   Opioid Risk Score:   Fall Risk Score:  `1  Depression screen PHQ 2/9  Depression screen [facility] 2/9 01/19/2016 08/03/2015 01/25/2015 08/10/2014  Decreased Interest 1 1 0 2  Down, Depressed, Hopeless 1 1 0 1  PHQ - 2 Score 2 2 0 3  Altered sleeping - 2 - 3  Tired, decreased energy - 1 - 3  Change in appetite - 1 - 3  Feeling bad or failure about yourself  - 0 - 1  Trouble concentrating - 0 - 2  Moving slowly or fidgety/restless - 0 - 0  Suicidal thoughts - 0 - 0  PHQ-9 Score - 6 - 15  Difficult doing work/chores - Not difficult at all - -    Review of Systems  All other systems reviewed and are negative.      Objective:   Physical Exam  Constitutional: He is oriented to person, place, and time. He appears well-developed and well-nourished.  HENT:  Head: Normocephalic and atraumatic.  Neck: Normal range of motion. Neck supple.  Cardiovascular: Normal rate and regular rhythm.   Pulmonary/Chest: Effort normal and breath sounds normal.  Musculoskeletal:  Normal Muscle Bulk and Muscle Testing Reveals: Upper Extremities: Full ROM and Muscle Strength  5/5 Lower Extremities: Full ROM and Muscle Strength 5/5 Bilateral Lower Extremities Flexion Produces Pain into Bilateral Feet Arises from Table with ease Narrow Based Gait   Neurological: He is alert and oriented to person, place, and time.  Skin: Skin is warm and dry.  Psychiatric: He has a normal mood and affect.  Nursing note and vitals reviewed.         Assessment & Plan:  1.Chronic foot pain with history of plantar fasciitis: Refilled:Oxycodone 10/325 mg one tablet every 6 hours as needed #120 and  Xtampza ER 27 mg one capsule every 12 hours #60. Continue Lyrica. Second script given for the following month. We will continue the opioid monitoring program this consists of, regular clinic visits, examinations, urine drug screen, pill counts as well as use of [place] Controlled Substance Reporting System. 2.Chronic Kidney Disease stage V:On Peritoneal Dialysis/ Nephrology Following. 3. Anxiety: Continue Xanax 0.5 mg BID/prn   20 minutes of face to face patient care time was spent during this visit. All questions were encouraged and answered.  .  F/U in 1 month

## 2016-01-26 LAB — TOXASSURE SELECT,+ANTIDEPR,UR

## 2016-01-26 NOTE — Progress Notes (Signed)
Urine drug screen for this encounter is consistent for prescribed medications.   

## 2016-03-20 ENCOUNTER — Encounter: Payer: Self-pay | Admitting: Registered Nurse

## 2016-03-20 ENCOUNTER — Encounter: Payer: BLUE CROSS/BLUE SHIELD | Attending: Physical Medicine & Rehabilitation | Admitting: Registered Nurse

## 2016-03-20 VITALS — BP 113/73 | HR 79

## 2016-03-20 DIAGNOSIS — M109 Gout, unspecified: Secondary | ICD-10-CM

## 2016-03-20 DIAGNOSIS — Z79899 Other long term (current) drug therapy: Secondary | ICD-10-CM | POA: Insufficient documentation

## 2016-03-20 DIAGNOSIS — M1009 Idiopathic gout, multiple sites: Secondary | ICD-10-CM | POA: Diagnosis present

## 2016-03-20 DIAGNOSIS — Z5181 Encounter for therapeutic drug level monitoring: Secondary | ICD-10-CM | POA: Insufficient documentation

## 2016-03-20 DIAGNOSIS — M722 Plantar fascial fibromatosis: Secondary | ICD-10-CM | POA: Insufficient documentation

## 2016-03-20 DIAGNOSIS — N185 Chronic kidney disease, stage 5: Secondary | ICD-10-CM | POA: Diagnosis not present

## 2016-03-20 DIAGNOSIS — F411 Generalized anxiety disorder: Secondary | ICD-10-CM

## 2016-03-20 DIAGNOSIS — G894 Chronic pain syndrome: Secondary | ICD-10-CM | POA: Diagnosis not present

## 2016-03-20 DIAGNOSIS — M05779 Rheumatoid arthritis with rheumatoid factor of unspecified ankle and foot without organ or systems involvement: Secondary | ICD-10-CM

## 2016-03-20 DIAGNOSIS — M792 Neuralgia and neuritis, unspecified: Secondary | ICD-10-CM

## 2016-03-20 MED ORDER — OXYCODONE-ACETAMINOPHEN 10-325 MG PO TABS: 1.0000 | ORAL_TABLET | Freq: Four times a day (QID) | ORAL | 0 refills | Status: DC | PRN

## 2016-03-20 MED ORDER — OXYCODONE ER 27 MG PO C12A: 1.0000 | EXTENDED_RELEASE_CAPSULE | Freq: Two times a day (BID) | ORAL | 0 refills | Status: DC

## 2016-03-20 MED ORDER — ALPRAZOLAM 0.5 MG PO TABS: ORAL_TABLET | ORAL | 2 refills | Status: DC

## 2016-03-20 NOTE — Progress Notes (Signed)
Subjective:    Patient ID: James Kim, male    DOB: October 17, 1975, 40 y.o.   MRN: [id number]  HPI:  [name] is a 40 year old male who returns for follow up for chronic pain and medication refill. He states his pain is located in his bilateral heels and bilateral feet. He rates his pain 5. His current exercise regime is walking.  Pain Inventory Average Pain 9 Pain Right Now 5 My pain is sharp, stabbing and aching  In the last 24 hours, has pain interfered with the following? General activity 9 Relation with others 4 Enjoyment of life 9 What TIME of day is your pain at its worst? morning and evening Sleep (in general) Poor  Pain is worse with: walking, standing and some activites Pain improves with: rest, heat/ice, medication and injections Relief from Meds: 6  Mobility walk without assistance how many minutes can you walk? 10-15 ability to climb steps?  yes do you drive?  yes Do you have any goals in this area?  yes  Function disabled: date disabled . I need assistance with the following:  household duties Do you have any goals in this area?  yes  Neuro/Psych trouble walking anxiety  Prior Studies Any changes since last visit?  no  Physicians involved in your care Any changes since last visit?  no   Family History  Problem Relation Age of Onset  . Stroke Mother   . Diabetes Father    Social History   Social History  . Marital status: Married    Spouse name: N/A  . Number of children: N/A  . Years of education: N/A   Social History Main Topics  . Smoking status: Never Smoker  . Smokeless tobacco: Current User  . Alcohol use Yes  . Drug use: No  . Sexual activity: Not Asked   Other Topics Concern  . None   Social History Narrative  . None   History reviewed. No pertinent surgical history. Past Medical History:  Diagnosis Date  . Arthritis   . Asthma   . 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 Hypertension     BP 113/73   Pulse 79   SpO2 97%   Opioid Risk Score:   Fall Risk Score:  `1  Depression screen PHQ 2/9  Depression screen [facility] 2/9 01/19/2016 08/03/2015 01/25/2015 08/10/2014  Decreased Interest 1 1 0 2  Down, Depressed, Hopeless 1 1 0 1  PHQ - 2 Score 2 2 0 3  Altered sleeping - 2 - 3  Tired, decreased energy - 1 - 3  Change in appetite - 1 - 3  Feeling bad or failure about yourself  - 0 - 1  Trouble concentrating - 0 - 2  Moving slowly or fidgety/restless - 0 - 0  Suicidal thoughts - 0 - 0  PHQ-9 Score - 6 - 15  Difficult doing work/chores - Not difficult at all - -    Review of Systems  Constitutional: Positive for unexpected weight change.  HENT: Negative.   Eyes: Negative.   Respiratory: Positive for apnea.   Cardiovascular: Negative.   Gastrointestinal: Negative.   Endocrine: Negative.   Genitourinary: Negative.   Musculoskeletal: Negative.   Skin: Negative.   Allergic/Immunologic: Negative.   Neurological: Negative.   Hematological: Negative.   Psychiatric/Behavioral: The patient is nervous/anxious.        Objective:   Physical Exam  Constitutional: He is oriented to  person, place, and time. He appears well-developed and well-nourished.  HENT:  Head: Normocephalic and atraumatic.  Neck: Normal range of motion. Neck supple.  Cardiovascular: Normal rate and regular rhythm.   Pulmonary/Chest: Effort normal and breath sounds normal.  Musculoskeletal:  Normal Muscle Bulk and Muscle Testing Reveals: Upper Extremities: Full ROM and Muscle Strength 5/5 Lower Extremities: Full ROM and Muscle Strength 5/5 Arises from Table slowly Narrow Based Gait  Neurological: He is alert and oriented to person, place, and time.  Skin: Skin is warm and dry.  Psychiatric: He has a normal mood and affect.  Nursing note and vitals reviewed.         Assessment & Plan:  1.Chronic foot pain with history of plantar fasciitis: Refilled:Oxycodone 10/325 mg one tablet every 6  hours as needed #120 and  Xtampza ER 27 mg one capsule every 12 hours #60. Continue Lyrica. Second script given for the following month. We will continue the opioid monitoring program this consists of, regular clinic visits, examinations, urine drug screen, pill counts as well as use of [place] Controlled Substance Reporting System. 2.Chronic Kidney Disease stage V:On Peritoneal Dialysis/ Nephrology Following. 3. Anxiety: Continue Xanax 0.5 mg BID/prn   20 minutes of face to face patient care time was spent during this visit. All questions were encouraged and answered.  .  F/U in 1 month

## 2016-05-15 ENCOUNTER — Encounter
Payer: BLUE CROSS/BLUE SHIELD | Attending: Physical Medicine & Rehabilitation | Admitting: Physical Medicine & Rehabilitation

## 2016-05-15 DIAGNOSIS — Z5181 Encounter for therapeutic drug level monitoring: Secondary | ICD-10-CM | POA: Insufficient documentation

## 2016-05-15 DIAGNOSIS — M1009 Idiopathic gout, multiple sites: Secondary | ICD-10-CM | POA: Insufficient documentation

## 2016-05-15 DIAGNOSIS — M722 Plantar fascial fibromatosis: Secondary | ICD-10-CM | POA: Insufficient documentation

## 2016-05-15 DIAGNOSIS — Z79899 Other long term (current) drug therapy: Secondary | ICD-10-CM | POA: Insufficient documentation

## 2016-05-15 DIAGNOSIS — N185 Chronic kidney disease, stage 5: Secondary | ICD-10-CM | POA: Insufficient documentation

## 2016-05-17 ENCOUNTER — Encounter (HOSPITAL_BASED_OUTPATIENT_CLINIC_OR_DEPARTMENT_OTHER): Payer: BLUE CROSS/BLUE SHIELD | Admitting: Registered Nurse

## 2016-05-17 ENCOUNTER — Encounter: Payer: Self-pay | Admitting: Registered Nurse

## 2016-05-17 VITALS — BP 129/91 | HR 100

## 2016-05-17 DIAGNOSIS — Z79899 Other long term (current) drug therapy: Secondary | ICD-10-CM | POA: Diagnosis not present

## 2016-05-17 DIAGNOSIS — M722 Plantar fascial fibromatosis: Secondary | ICD-10-CM | POA: Diagnosis not present

## 2016-05-17 DIAGNOSIS — Z5181 Encounter for therapeutic drug level monitoring: Secondary | ICD-10-CM

## 2016-05-17 DIAGNOSIS — M109 Gout, unspecified: Secondary | ICD-10-CM

## 2016-05-17 DIAGNOSIS — G894 Chronic pain syndrome: Secondary | ICD-10-CM

## 2016-05-17 DIAGNOSIS — N185 Chronic kidney disease, stage 5: Secondary | ICD-10-CM

## 2016-05-17 DIAGNOSIS — M1009 Idiopathic gout, multiple sites: Secondary | ICD-10-CM | POA: Diagnosis not present

## 2016-05-17 DIAGNOSIS — M05779 Rheumatoid arthritis with rheumatoid factor of unspecified ankle and foot without organ or systems involvement: Secondary | ICD-10-CM

## 2016-05-17 MED ORDER — OXYCODONE-ACETAMINOPHEN 10-325 MG PO TABS: 1.0000 | ORAL_TABLET | Freq: Four times a day (QID) | ORAL | 0 refills | Status: DC | PRN

## 2016-05-17 MED ORDER — OXYCODONE HCL ER 20 MG PO T12A: 20.0000 mg | EXTENDED_RELEASE_TABLET | Freq: Two times a day (BID) | ORAL | 0 refills | Status: DC

## 2016-05-17 NOTE — Progress Notes (Signed)
Subjective:    Patient ID: James Kim, male    DOB: 1975/10/26, 41 y.o.   MRN: [id number]  HPI: James Kim is a 41 year old male who returns for follow up appointment for chronic pain and medication refill. He states his pain is located in his bilateral lower extremities, bilateral knees and bilateral feet. He rates his pain 6. His current exercise regime is walking. [name] brought in a letter from his insurance company, they are no longer covering Xtampza. Xtampza discontinued and he will be prescribed Oxycontin. He verbalizes understanding.    Pain Inventory Average Pain 8 Pain Right Now 6 My pain is sharp, stabbing and aching  In the last 24 hours, has pain interfered with the following? General activity 8 Relation with others 5 Enjoyment of life 10 What TIME of day is your pain at its worst? morning Sleep (in general) Poor  Pain is worse with: walking, standing and some activites Pain improves with: rest, heat/ice, medication and injections Relief from Meds: 7  Mobility walk without assistance ability to climb steps?  yes do you drive?  yes Do you have any goals in this area?  yes  Function disabled: date disabled . I need assistance with the following:  household duties and shopping  Neuro/Psych trouble walking anxiety  Prior Studies Any changes since last visit?  no  Physicians involved in your care Any changes since last visit?  no   Family History  Problem Relation Age of Onset  . Stroke Mother   . Diabetes Father    Social History   Social History  . Marital status: Married    Spouse name: N/A  . Number of children: N/A  . Years of education: N/A   Social History Main Topics  . Smoking status: Never Smoker  . Smokeless tobacco: Current User  . Alcohol use Yes  . Drug use: No  . Sexual activity: Not on file   Other Topics Concern  . Not on file   Social History Narrative  . No narrative on file   No past surgical  history on file. Past Medical History:  Diagnosis Date  . Arthritis   . Asthma   . 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 Hypertension    There were no vitals taken for this visit.  Opioid Risk Score:   Fall Risk Score:  `1  Depression screen PHQ 2/9  Depression screen [facility] 2/9 01/19/2016 08/03/2015 01/25/2015 08/10/2014  Decreased Interest 1 1 0 2  Down, Depressed, Hopeless 1 1 0 1  PHQ - 2 Score 2 2 0 3  Altered sleeping - 2 - 3  Tired, decreased energy - 1 - 3  Change in appetite - 1 - 3  Feeling bad or failure about yourself  - 0 - 1  Trouble concentrating - 0 - 2  Moving slowly or fidgety/restless - 0 - 0  Suicidal thoughts - 0 - 0  PHQ-9 Score - 6 - 15  Difficult doing work/chores - Not difficult at all - -   Review of Systems  Constitutional: Negative.   HENT: Negative.   Eyes: Negative.   Respiratory: Negative.   Cardiovascular: Negative.   Gastrointestinal: Negative.   Endocrine: Negative.   Genitourinary: Negative.   Musculoskeletal: Positive for gait problem.  Skin: Negative.   Allergic/Immunologic: Negative.   Hematological: Negative.   Psychiatric/Behavioral: Negative.   All other systems reviewed and are negative.  Objective:   Physical Exam  Constitutional: He is oriented to person, place, and time. He appears well-developed and well-nourished.  HENT:  Head: Normocephalic and atraumatic.  Neck: Normal range of motion. Neck supple.  Cardiovascular: Normal rate and regular rhythm.   Pulmonary/Chest: Effort normal and breath sounds normal.  Musculoskeletal:  Normal Muscle Bulk and Muscle Testing Reveals: Upper Extremities: Full ROM and Muscle Strength 5/5 Lower Extremities: Full ROM and Muscle Strength 5/5 Right Lower Extremity Flexion Produces Pain into Right Foot Arises from Table with ease Narrow Based gait  Neurological: He is alert and oriented to person, place, and time.  Skin: Skin is warm and dry.  Psychiatric: He has a  normal mood and affect.  Nursing note and vitals reviewed.         Assessment & Plan:  1.Chronic foot pain with history of plantar fasciitis: Refilled:Oxycodone 10/325 mg one tablet every 6 hours as needed #120 and RX: Oxycontin 20 mg one capsule every 12 hours #60. Continue Lyrica.  We will continue the opioid monitoring program this consists of, regular clinic visits, examinations, urine drug screen, pill counts as well as use of [place] Controlled Substance Reporting System. 2.Chronic Kidney Disease stage V:On Peritoneal Dialysis/ Nephrology Following. 3. Anxiety: Continue Xanax 0.5 mg BID/prn   20 minutes of face to face patient care time was spent during this visit. All questions were encouraged and answered.  .  F/U in 1 month

## 2016-05-25 ENCOUNTER — Telehealth: Payer: Self-pay | Admitting: Registered Nurse

## 2016-05-25 NOTE — Telephone Encounter (Signed)
On January 18,2018 NCCSR was reviewed: No conflict was seen on the [place] Controlled Substance Reporting System with Multiple Prescribers. James Kim a signed Narcotic Contract with our office. If there were any discrepancies this would have beenreported to his Physcian.

## 2016-06-04 DIAGNOSIS — N2581 Secondary hyperparathyroidism of renal origin: Secondary | ICD-10-CM | POA: Diagnosis not present

## 2016-06-04 DIAGNOSIS — N186 End stage renal disease: Secondary | ICD-10-CM | POA: Diagnosis not present

## 2016-06-05 DIAGNOSIS — N2581 Secondary hyperparathyroidism of renal origin: Secondary | ICD-10-CM | POA: Diagnosis not present

## 2016-06-05 DIAGNOSIS — N186 End stage renal disease: Secondary | ICD-10-CM | POA: Diagnosis not present

## 2016-06-06 DIAGNOSIS — N2581 Secondary hyperparathyroidism of renal origin: Secondary | ICD-10-CM | POA: Diagnosis not present

## 2016-06-06 DIAGNOSIS — N186 End stage renal disease: Secondary | ICD-10-CM | POA: Diagnosis not present

## 2016-06-07 DIAGNOSIS — N186 End stage renal disease: Secondary | ICD-10-CM | POA: Diagnosis not present

## 2016-06-07 DIAGNOSIS — Z992 Dependence on renal dialysis: Secondary | ICD-10-CM | POA: Diagnosis not present

## 2016-06-07 DIAGNOSIS — N2581 Secondary hyperparathyroidism of renal origin: Secondary | ICD-10-CM | POA: Diagnosis not present

## 2016-06-14 ENCOUNTER — Encounter: Payer: BLUE CROSS/BLUE SHIELD | Admitting: Physical Medicine & Rehabilitation

## 2016-06-16 ENCOUNTER — Encounter: Payer: Self-pay | Admitting: Registered Nurse

## 2016-06-16 ENCOUNTER — Encounter: Payer: BLUE CROSS/BLUE SHIELD | Attending: Physical Medicine & Rehabilitation | Admitting: Registered Nurse

## 2016-06-16 VITALS — BP 132/91 | HR 113 | Resp 14

## 2016-06-16 DIAGNOSIS — F411 Generalized anxiety disorder: Secondary | ICD-10-CM | POA: Diagnosis not present

## 2016-06-16 DIAGNOSIS — N185 Chronic kidney disease, stage 5: Secondary | ICD-10-CM | POA: Diagnosis not present

## 2016-06-16 DIAGNOSIS — Z79899 Other long term (current) drug therapy: Secondary | ICD-10-CM | POA: Diagnosis not present

## 2016-06-16 DIAGNOSIS — M1009 Idiopathic gout, multiple sites: Secondary | ICD-10-CM | POA: Diagnosis present

## 2016-06-16 DIAGNOSIS — G894 Chronic pain syndrome: Secondary | ICD-10-CM | POA: Diagnosis not present

## 2016-06-16 DIAGNOSIS — Z5181 Encounter for therapeutic drug level monitoring: Secondary | ICD-10-CM

## 2016-06-16 DIAGNOSIS — M792 Neuralgia and neuritis, unspecified: Secondary | ICD-10-CM | POA: Diagnosis not present

## 2016-06-16 DIAGNOSIS — M722 Plantar fascial fibromatosis: Secondary | ICD-10-CM | POA: Diagnosis not present

## 2016-06-16 DIAGNOSIS — M109 Gout, unspecified: Secondary | ICD-10-CM | POA: Diagnosis not present

## 2016-06-16 MED ORDER — OXYCODONE HCL ER 20 MG PO T12A: 20.0000 mg | EXTENDED_RELEASE_TABLET | Freq: Two times a day (BID) | ORAL | 0 refills | Status: DC

## 2016-06-16 MED ORDER — ALPRAZOLAM 0.5 MG PO TABS: ORAL_TABLET | ORAL | 2 refills | Status: DC

## 2016-06-16 MED ORDER — OXYCODONE HCL ER 20 MG PO T12A
20.0000 mg | EXTENDED_RELEASE_TABLET | Freq: Two times a day (BID) | ORAL | 0 refills | Status: DC
Start: 2016-06-16 — End: 2016-07-10

## 2016-06-16 MED ORDER — OXYCODONE-ACETAMINOPHEN 10-325 MG PO TABS: 1.0000 | ORAL_TABLET | Freq: Four times a day (QID) | ORAL | 0 refills | Status: DC | PRN

## 2016-06-16 NOTE — Progress Notes (Signed)
Subjective:    Patient ID: James Kim, male    DOB: 07-Apr-1976, 41 y.o.   MRN: [id number]  HPI: James Kim is a 41 year old male who returns for follow up appointment for chronic pain and medication refill. He states his pain is located in his bilateral lower extremities, bilateral knees and bilateral feet. He rates his pain 6. His current exercise regime is walking.  James Kim being treated for Peritonitis he states.  Wife in room all questions answered.   Pain Inventory Average Pain 7 Pain Right Now 6 My pain is sharp, stabbing and aching  In the last 24 hours, has pain interfered with the following? General activity 8 Relation with others 5 Enjoyment of life 8 What TIME of day is your pain at its worst? morning, evening Sleep (in general) Poor  Pain is worse with: walking, standing and some activites Pain improves with: rest, heat/ice and medication Relief from Meds: 6  Mobility walk without assistance how many minutes can you walk? 15-20 ability to climb steps?  yes do you drive?  yes Do you have any goals in this area?  yes  Function I need assistance with the following:  household duties Do you have any goals in this area?  yes  Neuro/Psych trouble walking anxiety  Prior Studies Any changes since last visit?  no  Physicians involved in your care Any changes since last visit?  no   Family History  Problem Relation Age of Onset  . Stroke Mother   . Diabetes Father    Social History   Social History  . Marital status: Married    Spouse name: N/A  . Number of children: N/A  . Years of education: N/A   Social History Main Topics  . Smoking status: Never Smoker  . Smokeless tobacco: Current User  . Alcohol use Yes  . Drug use: No  . Sexual activity: Not Asked   Other Topics Concern  . None   Social History Narrative  . None   History reviewed. No pertinent surgical history. Past Medical History:  Diagnosis Date  .  Arthritis   . Asthma   . 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 Hypertension    BP (!) 132/91 (BP Location: Left Arm, Patient Position: Sitting, Cuff Size: Normal)   Pulse (!) 113   Resp 14   SpO2 98%   Opioid Risk Score:   Fall Risk Score:  `1  Depression screen PHQ 2/9  Depression screen [facility] 2/9 01/19/2016 08/03/2015 01/25/2015 08/10/2014  Decreased Interest 1 1 0 2  Down, Depressed, Hopeless 1 1 0 1  PHQ - 2 Score 2 2 0 3  Altered sleeping - 2 - 3  Tired, decreased energy - 1 - 3  Change in appetite - 1 - 3  Feeling bad or failure about yourself  - 0 - 1  Trouble concentrating - 0 - 2  Moving slowly or fidgety/restless - 0 - 0  Suicidal thoughts - 0 - 0  PHQ-9 Score - 6 - 15  Difficult doing work/chores - Not difficult at all - -    Review of Systems  Constitutional: Positive for appetite change.  HENT: Negative.   Eyes: Negative.   Respiratory: Positive for apnea.   Cardiovascular: Negative.   Gastrointestinal: Negative.   Endocrine: Negative.   Genitourinary: Negative.   Musculoskeletal: Positive for arthralgias and gait problem.  Skin: Negative.   Allergic/Immunologic: Negative.  Hematological: Negative.   Psychiatric/Behavioral: The patient is nervous/anxious.   All other systems reviewed and are negative.      Objective:   Physical Exam  Constitutional: He is oriented to person, place, and time. He appears well-developed and well-nourished.  HENT:  Head: Normocephalic and atraumatic.  Neck: Normal range of motion. Neck supple.  Cardiovascular: Normal rate and regular rhythm.   Pulmonary/Chest: Effort normal and breath sounds normal.  Musculoskeletal:  Normal Muscle Bulk and Muscle Testing Reveals: Upper Extremities: Full ROM and Muscle Strength 5/5 Lower Extremities: Full ROM and Muscle Strength 5/5 Right Lower Extremity Flkexion Produces Pain into Patella Arises from Table Slowly Antalgic Gait  Neurological: He is alert and oriented  to person, place, and time.  Skin: Skin is warm and dry.  Psychiatric: He has a normal mood and affect.  Nursing note and vitals reviewed.         Assessment & Plan:  1.Chronic foot pain with history of plantar fasciitis/ Gout Arthropathy: 06/16/2016 Refilled:Oxycodone 10/325 mg one tablet every 6 hours as needed #120 and Oxycontin 20 mg one capsule every 12 hours #60. Continue Lyrica.  We will continue the opioid monitoring program this consists of, regular clinic visits, examinations, urine drug screen, pill counts as well as use of [place] Controlled Substance Reporting System. 2.Chronic Kidney Disease stage V:On Peritoneal Dialysis/ Nephrology Following. 3. Anxiety: Continue Xanax 0.5 mg BID/prn   20 minutes of face to face patient care time was spent during this visit. All questions were encouraged and answered.   F/U in 1 month

## 2016-07-05 DIAGNOSIS — N186 End stage renal disease: Secondary | ICD-10-CM | POA: Diagnosis not present

## 2016-07-05 DIAGNOSIS — Z992 Dependence on renal dialysis: Secondary | ICD-10-CM | POA: Diagnosis not present

## 2016-07-06 DIAGNOSIS — Z4932 Encounter for adequacy testing for peritoneal dialysis: Secondary | ICD-10-CM | POA: Diagnosis not present

## 2016-07-06 DIAGNOSIS — E875 Hyperkalemia: Secondary | ICD-10-CM | POA: Diagnosis not present

## 2016-07-06 DIAGNOSIS — T8571XA Infection and inflammatory reaction due to peritoneal dialysis catheter, initial encounter: Secondary | ICD-10-CM | POA: Diagnosis not present

## 2016-07-06 DIAGNOSIS — N2581 Secondary hyperparathyroidism of renal origin: Secondary | ICD-10-CM | POA: Diagnosis not present

## 2016-07-06 DIAGNOSIS — N186 End stage renal disease: Secondary | ICD-10-CM | POA: Diagnosis not present

## 2016-07-06 DIAGNOSIS — K659 Peritonitis, unspecified: Secondary | ICD-10-CM | POA: Diagnosis not present

## 2016-07-06 DIAGNOSIS — D631 Anemia in chronic kidney disease: Secondary | ICD-10-CM | POA: Diagnosis not present

## 2016-07-07 DIAGNOSIS — N186 End stage renal disease: Secondary | ICD-10-CM | POA: Diagnosis not present

## 2016-07-07 DIAGNOSIS — E875 Hyperkalemia: Secondary | ICD-10-CM | POA: Diagnosis not present

## 2016-07-07 DIAGNOSIS — K659 Peritonitis, unspecified: Secondary | ICD-10-CM | POA: Diagnosis not present

## 2016-07-07 DIAGNOSIS — Z4932 Encounter for adequacy testing for peritoneal dialysis: Secondary | ICD-10-CM | POA: Diagnosis not present

## 2016-07-07 DIAGNOSIS — D631 Anemia in chronic kidney disease: Secondary | ICD-10-CM | POA: Diagnosis not present

## 2016-07-07 DIAGNOSIS — T8571XA Infection and inflammatory reaction due to peritoneal dialysis catheter, initial encounter: Secondary | ICD-10-CM | POA: Diagnosis not present

## 2016-07-08 DIAGNOSIS — E875 Hyperkalemia: Secondary | ICD-10-CM | POA: Diagnosis not present

## 2016-07-08 DIAGNOSIS — D631 Anemia in chronic kidney disease: Secondary | ICD-10-CM | POA: Diagnosis not present

## 2016-07-08 DIAGNOSIS — T8571XA Infection and inflammatory reaction due to peritoneal dialysis catheter, initial encounter: Secondary | ICD-10-CM | POA: Diagnosis not present

## 2016-07-08 DIAGNOSIS — N186 End stage renal disease: Secondary | ICD-10-CM | POA: Diagnosis not present

## 2016-07-08 DIAGNOSIS — Z4932 Encounter for adequacy testing for peritoneal dialysis: Secondary | ICD-10-CM | POA: Diagnosis not present

## 2016-07-08 DIAGNOSIS — K659 Peritonitis, unspecified: Secondary | ICD-10-CM | POA: Diagnosis not present

## 2016-07-09 DIAGNOSIS — E875 Hyperkalemia: Secondary | ICD-10-CM | POA: Diagnosis not present

## 2016-07-09 DIAGNOSIS — N186 End stage renal disease: Secondary | ICD-10-CM | POA: Diagnosis not present

## 2016-07-09 DIAGNOSIS — D631 Anemia in chronic kidney disease: Secondary | ICD-10-CM | POA: Diagnosis not present

## 2016-07-09 DIAGNOSIS — Z4932 Encounter for adequacy testing for peritoneal dialysis: Secondary | ICD-10-CM | POA: Diagnosis not present

## 2016-07-09 DIAGNOSIS — T8571XA Infection and inflammatory reaction due to peritoneal dialysis catheter, initial encounter: Secondary | ICD-10-CM | POA: Diagnosis not present

## 2016-07-09 DIAGNOSIS — K659 Peritonitis, unspecified: Secondary | ICD-10-CM | POA: Diagnosis not present

## 2016-07-10 ENCOUNTER — Encounter: Payer: Self-pay | Admitting: Registered Nurse

## 2016-07-10 ENCOUNTER — Encounter: Payer: Medicare Other | Attending: Physical Medicine & Rehabilitation | Admitting: Registered Nurse

## 2016-07-10 VITALS — BP 124/83 | HR 79 | Resp 14

## 2016-07-10 DIAGNOSIS — Z79899 Other long term (current) drug therapy: Secondary | ICD-10-CM

## 2016-07-10 DIAGNOSIS — G894 Chronic pain syndrome: Secondary | ICD-10-CM | POA: Diagnosis not present

## 2016-07-10 DIAGNOSIS — D631 Anemia in chronic kidney disease: Secondary | ICD-10-CM | POA: Diagnosis not present

## 2016-07-10 DIAGNOSIS — N185 Chronic kidney disease, stage 5: Secondary | ICD-10-CM

## 2016-07-10 DIAGNOSIS — Z5181 Encounter for therapeutic drug level monitoring: Secondary | ICD-10-CM

## 2016-07-10 DIAGNOSIS — M722 Plantar fascial fibromatosis: Secondary | ICD-10-CM

## 2016-07-10 DIAGNOSIS — E875 Hyperkalemia: Secondary | ICD-10-CM | POA: Diagnosis not present

## 2016-07-10 DIAGNOSIS — M109 Gout, unspecified: Secondary | ICD-10-CM | POA: Diagnosis not present

## 2016-07-10 DIAGNOSIS — F411 Generalized anxiety disorder: Secondary | ICD-10-CM | POA: Diagnosis not present

## 2016-07-10 DIAGNOSIS — K659 Peritonitis, unspecified: Secondary | ICD-10-CM | POA: Diagnosis not present

## 2016-07-10 DIAGNOSIS — N186 End stage renal disease: Secondary | ICD-10-CM | POA: Diagnosis not present

## 2016-07-10 DIAGNOSIS — Z4932 Encounter for adequacy testing for peritoneal dialysis: Secondary | ICD-10-CM | POA: Diagnosis not present

## 2016-07-10 DIAGNOSIS — M1009 Idiopathic gout, multiple sites: Secondary | ICD-10-CM | POA: Insufficient documentation

## 2016-07-10 DIAGNOSIS — T8571XA Infection and inflammatory reaction due to peritoneal dialysis catheter, initial encounter: Secondary | ICD-10-CM | POA: Diagnosis not present

## 2016-07-10 MED ORDER — OXYCODONE HCL ER 20 MG PO T12A: 20.0000 mg | EXTENDED_RELEASE_TABLET | Freq: Two times a day (BID) | ORAL | 0 refills | Status: DC

## 2016-07-10 MED ORDER — OXYCODONE-ACETAMINOPHEN 10-325 MG PO TABS: 1.0000 | ORAL_TABLET | Freq: Four times a day (QID) | ORAL | 0 refills | Status: DC | PRN

## 2016-07-10 NOTE — Progress Notes (Signed)
Subjective:    Patient ID: James Kim, male    DOB: 04-30-1976, 41 y.o.   MRN: [id number]  HPI: [name] is a 41 year old male who returns for follow up appointmentfor chronic pain and medication refill. He states his pain is located in his bilateral lower extremities and bilateral feet. He rates his pain 5.His current exercise regime is walking.  Wife in room all questions answered.   Pain Inventory Average Pain 8 Pain Right Now 5 My pain is sharp, stabbing and aching  In the last 24 hours, has pain interfered with the following? General activity 8 Relation with others 3 Enjoyment of life 7 What TIME of day is your pain at its worst? morning, evening  Sleep (in general) Poor  Pain is worse with: walking, standing and some activites Pain improves with: rest, heat/ice, medication and injections Relief from Meds: 6  Mobility walk without assistance how many minutes can you walk? 10-15 ability to climb steps?  yes do you drive?  yes Do you have any goals in this area?  yes  Function disabled: date disabled . I need assistance with the following:  household duties Do you have any goals in this area?  yes  Neuro/Psych trouble walking anxiety  Prior Studies Any changes since last visit?  no  Physicians involved in your care Any changes since last visit?  no   Family History  Problem Relation Age of Onset  . Stroke Mother   . Diabetes Father    Social History   Social History  . Marital status: Married    Spouse name: N/A  . Number of children: N/A  . Years of education: N/A   Social History Main Topics  . Smoking status: Never Smoker  . Smokeless tobacco: Current User  . Alcohol use Yes  . Drug use: No  . Sexual activity: Not Asked   Other Topics Concern  . None   Social History Narrative  . None   History reviewed. No pertinent surgical history. Past Medical History:  Diagnosis Date  . Arthritis   . Asthma   . Chronic  kidney disease    Born with kidney defect  . Hypertension    BP 124/83   Pulse 79   Resp 14   SpO2 98%   Opioid Risk Score:   Fall Risk Score:  `1  Depression screen PHQ 2/9  Depression screen [facility] 2/9 01/19/2016 08/03/2015 01/25/2015 08/10/2014  Decreased Interest 1 1 0 2  Down, Depressed, Hopeless 1 1 0 1  PHQ - 2 Score 2 2 0 3  Altered sleeping - 2 - 3  Tired, decreased energy - 1 - 3  Change in appetite - 1 - 3  Feeling bad or failure about yourself  - 0 - 1  Trouble concentrating - 0 - 2  Moving slowly or fidgety/restless - 0 - 0  Suicidal thoughts - 0 - 0  PHQ-9 Score - 6 - 15  Difficult doing work/chores - Not difficult at all - -    Review of Systems  Constitutional: Positive for appetite change and unexpected weight change.  HENT: Negative.   Eyes: Negative.   Respiratory: Positive for apnea and shortness of breath.   Cardiovascular: Positive for leg swelling.  Gastrointestinal: Positive for nausea.  Endocrine: Negative.   Genitourinary: Positive for difficulty urinating.  Musculoskeletal: Negative.   Skin: Negative.   Allergic/Immunologic: Negative.   Neurological: Negative.   Hematological: Negative.  Psychiatric/Behavioral: The patient is nervous/anxious.   All other systems reviewed and are negative.      Objective:   Physical Exam  Constitutional: He is oriented to person, place, and time. He appears well-developed and well-nourished.  HENT:  Head: Normocephalic and atraumatic.  Neck: Normal range of motion. Neck supple.  Cardiovascular: Normal rate and regular rhythm.   Pulmonary/Chest: Effort normal and breath sounds normal.  Musculoskeletal:  Normal Muscle Bulk and Muscle Testing Reveals: Upper Extremities: Full ROM and Muscle Strength 5/5 Lower Extremities: Full ROM and Muscle Strength 5/5 Bilateral Lower Extremities Flexion Produces Pain into Bilateral Lower Extremities and Bilateral feet Arises from Table Slowly Narrow Based Gait    Neurological: He is alert and oriented to person, place, and time.  Skin: Skin is warm and dry.  Psychiatric: He has a normal mood and affect.  Nursing note and vitals reviewed.         Assessment & Plan:  1.Chronic foot pain with history of plantar fasciitis/ Gout Arthropathy: 07/10/2016 Refilled:Oxycodone 10/325 mg one tablet every 6 hours as needed #120 and Oxycontin 20mg  one capsule every 12 hours #60. Continue Lyrica. We will continue the opioid monitoring program this consists of, regular clinic visits, examinations, urine drug screen, pill counts as well as use of Controlled Substance Reporting System. 2.Chronic Kidney Disease stage V:On Peritoneal Dialysis/ Nephrology Following. 07/10/2016 3. Anxiety: Continue Xanax 0.5 mg BID/prn. 07/10/2016  20 minutes of face to face patient care time was spent during this visit. All questions were encouraged and answered.  F/U in 1 month

## 2016-07-11 DIAGNOSIS — Z4932 Encounter for adequacy testing for peritoneal dialysis: Secondary | ICD-10-CM | POA: Diagnosis not present

## 2016-07-11 DIAGNOSIS — T8571XA Infection and inflammatory reaction due to peritoneal dialysis catheter, initial encounter: Secondary | ICD-10-CM | POA: Diagnosis not present

## 2016-07-11 DIAGNOSIS — D631 Anemia in chronic kidney disease: Secondary | ICD-10-CM | POA: Diagnosis not present

## 2016-07-11 DIAGNOSIS — E875 Hyperkalemia: Secondary | ICD-10-CM | POA: Diagnosis not present

## 2016-07-11 DIAGNOSIS — N186 End stage renal disease: Secondary | ICD-10-CM | POA: Diagnosis not present

## 2016-07-11 DIAGNOSIS — K659 Peritonitis, unspecified: Secondary | ICD-10-CM | POA: Diagnosis not present

## 2016-07-12 DIAGNOSIS — D631 Anemia in chronic kidney disease: Secondary | ICD-10-CM | POA: Diagnosis not present

## 2016-07-12 DIAGNOSIS — Z4932 Encounter for adequacy testing for peritoneal dialysis: Secondary | ICD-10-CM | POA: Diagnosis not present

## 2016-07-12 DIAGNOSIS — K659 Peritonitis, unspecified: Secondary | ICD-10-CM | POA: Diagnosis not present

## 2016-07-12 DIAGNOSIS — E875 Hyperkalemia: Secondary | ICD-10-CM | POA: Diagnosis not present

## 2016-07-12 DIAGNOSIS — T8571XA Infection and inflammatory reaction due to peritoneal dialysis catheter, initial encounter: Secondary | ICD-10-CM | POA: Diagnosis not present

## 2016-07-12 DIAGNOSIS — N186 End stage renal disease: Secondary | ICD-10-CM | POA: Diagnosis not present

## 2016-07-13 DIAGNOSIS — N186 End stage renal disease: Secondary | ICD-10-CM | POA: Diagnosis not present

## 2016-07-13 DIAGNOSIS — Z4932 Encounter for adequacy testing for peritoneal dialysis: Secondary | ICD-10-CM | POA: Diagnosis not present

## 2016-07-13 DIAGNOSIS — T8571XA Infection and inflammatory reaction due to peritoneal dialysis catheter, initial encounter: Secondary | ICD-10-CM | POA: Diagnosis not present

## 2016-07-13 DIAGNOSIS — D631 Anemia in chronic kidney disease: Secondary | ICD-10-CM | POA: Diagnosis not present

## 2016-07-13 DIAGNOSIS — E875 Hyperkalemia: Secondary | ICD-10-CM | POA: Diagnosis not present

## 2016-07-13 DIAGNOSIS — K659 Peritonitis, unspecified: Secondary | ICD-10-CM | POA: Diagnosis not present

## 2016-07-14 DIAGNOSIS — K659 Peritonitis, unspecified: Secondary | ICD-10-CM | POA: Diagnosis not present

## 2016-07-14 DIAGNOSIS — Z4932 Encounter for adequacy testing for peritoneal dialysis: Secondary | ICD-10-CM | POA: Diagnosis not present

## 2016-07-14 DIAGNOSIS — T8571XA Infection and inflammatory reaction due to peritoneal dialysis catheter, initial encounter: Secondary | ICD-10-CM | POA: Diagnosis not present

## 2016-07-14 DIAGNOSIS — E875 Hyperkalemia: Secondary | ICD-10-CM | POA: Diagnosis not present

## 2016-07-14 DIAGNOSIS — D631 Anemia in chronic kidney disease: Secondary | ICD-10-CM | POA: Diagnosis not present

## 2016-07-14 DIAGNOSIS — N186 End stage renal disease: Secondary | ICD-10-CM | POA: Diagnosis not present

## 2016-07-15 DIAGNOSIS — Z4932 Encounter for adequacy testing for peritoneal dialysis: Secondary | ICD-10-CM | POA: Diagnosis not present

## 2016-07-15 DIAGNOSIS — T8571XA Infection and inflammatory reaction due to peritoneal dialysis catheter, initial encounter: Secondary | ICD-10-CM | POA: Diagnosis not present

## 2016-07-15 DIAGNOSIS — N186 End stage renal disease: Secondary | ICD-10-CM | POA: Diagnosis not present

## 2016-07-15 DIAGNOSIS — K659 Peritonitis, unspecified: Secondary | ICD-10-CM | POA: Diagnosis not present

## 2016-07-15 DIAGNOSIS — E875 Hyperkalemia: Secondary | ICD-10-CM | POA: Diagnosis not present

## 2016-07-15 DIAGNOSIS — D631 Anemia in chronic kidney disease: Secondary | ICD-10-CM | POA: Diagnosis not present

## 2016-07-16 DIAGNOSIS — Z4932 Encounter for adequacy testing for peritoneal dialysis: Secondary | ICD-10-CM | POA: Diagnosis not present

## 2016-07-16 DIAGNOSIS — E875 Hyperkalemia: Secondary | ICD-10-CM | POA: Diagnosis not present

## 2016-07-16 DIAGNOSIS — K659 Peritonitis, unspecified: Secondary | ICD-10-CM | POA: Diagnosis not present

## 2016-07-16 DIAGNOSIS — D631 Anemia in chronic kidney disease: Secondary | ICD-10-CM | POA: Diagnosis not present

## 2016-07-16 DIAGNOSIS — T8571XA Infection and inflammatory reaction due to peritoneal dialysis catheter, initial encounter: Secondary | ICD-10-CM | POA: Diagnosis not present

## 2016-07-16 DIAGNOSIS — N186 End stage renal disease: Secondary | ICD-10-CM | POA: Diagnosis not present

## 2016-07-17 DIAGNOSIS — E875 Hyperkalemia: Secondary | ICD-10-CM | POA: Diagnosis not present

## 2016-07-17 DIAGNOSIS — T8571XA Infection and inflammatory reaction due to peritoneal dialysis catheter, initial encounter: Secondary | ICD-10-CM | POA: Diagnosis not present

## 2016-07-17 DIAGNOSIS — K659 Peritonitis, unspecified: Secondary | ICD-10-CM | POA: Diagnosis not present

## 2016-07-17 DIAGNOSIS — D631 Anemia in chronic kidney disease: Secondary | ICD-10-CM | POA: Diagnosis not present

## 2016-07-17 DIAGNOSIS — Z4932 Encounter for adequacy testing for peritoneal dialysis: Secondary | ICD-10-CM | POA: Diagnosis not present

## 2016-07-17 DIAGNOSIS — N186 End stage renal disease: Secondary | ICD-10-CM | POA: Diagnosis not present

## 2016-07-18 DIAGNOSIS — D631 Anemia in chronic kidney disease: Secondary | ICD-10-CM | POA: Diagnosis not present

## 2016-07-18 DIAGNOSIS — T8571XA Infection and inflammatory reaction due to peritoneal dialysis catheter, initial encounter: Secondary | ICD-10-CM | POA: Diagnosis not present

## 2016-07-18 DIAGNOSIS — K659 Peritonitis, unspecified: Secondary | ICD-10-CM | POA: Diagnosis not present

## 2016-07-18 DIAGNOSIS — N186 End stage renal disease: Secondary | ICD-10-CM | POA: Diagnosis not present

## 2016-07-18 DIAGNOSIS — E875 Hyperkalemia: Secondary | ICD-10-CM | POA: Diagnosis not present

## 2016-07-18 DIAGNOSIS — Z4932 Encounter for adequacy testing for peritoneal dialysis: Secondary | ICD-10-CM | POA: Diagnosis not present

## 2016-07-19 DIAGNOSIS — Z4932 Encounter for adequacy testing for peritoneal dialysis: Secondary | ICD-10-CM | POA: Diagnosis not present

## 2016-07-19 DIAGNOSIS — K659 Peritonitis, unspecified: Secondary | ICD-10-CM | POA: Diagnosis not present

## 2016-07-19 DIAGNOSIS — T8571XA Infection and inflammatory reaction due to peritoneal dialysis catheter, initial encounter: Secondary | ICD-10-CM | POA: Diagnosis not present

## 2016-07-19 DIAGNOSIS — D631 Anemia in chronic kidney disease: Secondary | ICD-10-CM | POA: Diagnosis not present

## 2016-07-19 DIAGNOSIS — E875 Hyperkalemia: Secondary | ICD-10-CM | POA: Diagnosis not present

## 2016-07-19 DIAGNOSIS — N186 End stage renal disease: Secondary | ICD-10-CM | POA: Diagnosis not present

## 2016-07-20 DIAGNOSIS — N186 End stage renal disease: Secondary | ICD-10-CM | POA: Diagnosis not present

## 2016-07-20 DIAGNOSIS — K659 Peritonitis, unspecified: Secondary | ICD-10-CM | POA: Diagnosis not present

## 2016-07-20 DIAGNOSIS — Z4932 Encounter for adequacy testing for peritoneal dialysis: Secondary | ICD-10-CM | POA: Diagnosis not present

## 2016-07-20 DIAGNOSIS — D631 Anemia in chronic kidney disease: Secondary | ICD-10-CM | POA: Diagnosis not present

## 2016-07-20 DIAGNOSIS — T8571XA Infection and inflammatory reaction due to peritoneal dialysis catheter, initial encounter: Secondary | ICD-10-CM | POA: Diagnosis not present

## 2016-07-20 DIAGNOSIS — E875 Hyperkalemia: Secondary | ICD-10-CM | POA: Diagnosis not present

## 2016-07-21 DIAGNOSIS — E875 Hyperkalemia: Secondary | ICD-10-CM | POA: Diagnosis not present

## 2016-07-21 DIAGNOSIS — T8571XA Infection and inflammatory reaction due to peritoneal dialysis catheter, initial encounter: Secondary | ICD-10-CM | POA: Diagnosis not present

## 2016-07-21 DIAGNOSIS — Z4932 Encounter for adequacy testing for peritoneal dialysis: Secondary | ICD-10-CM | POA: Diagnosis not present

## 2016-07-21 DIAGNOSIS — D631 Anemia in chronic kidney disease: Secondary | ICD-10-CM | POA: Diagnosis not present

## 2016-07-21 DIAGNOSIS — N186 End stage renal disease: Secondary | ICD-10-CM | POA: Diagnosis not present

## 2016-07-21 DIAGNOSIS — K659 Peritonitis, unspecified: Secondary | ICD-10-CM | POA: Diagnosis not present

## 2016-07-22 ENCOUNTER — Encounter (HOSPITAL_COMMUNITY): Payer: Self-pay | Admitting: Emergency Medicine

## 2016-07-22 ENCOUNTER — Emergency Department (HOSPITAL_COMMUNITY)
Admission: EM | Admit: 2016-07-22 | Discharge: 2016-07-23 | Disposition: A | Discharge status: Home / Self Care | Payer: Medicare Other | Attending: Emergency Medicine | Admitting: Emergency Medicine

## 2016-07-22 DIAGNOSIS — F17200 Nicotine dependence, unspecified, uncomplicated: Secondary | ICD-10-CM | POA: Insufficient documentation

## 2016-07-22 DIAGNOSIS — K659 Peritonitis, unspecified: Secondary | ICD-10-CM

## 2016-07-22 DIAGNOSIS — D631 Anemia in chronic kidney disease: Secondary | ICD-10-CM | POA: Diagnosis not present

## 2016-07-22 DIAGNOSIS — J45909 Unspecified asthma, uncomplicated: Secondary | ICD-10-CM | POA: Diagnosis not present

## 2016-07-22 DIAGNOSIS — E875 Hyperkalemia: Secondary | ICD-10-CM | POA: Diagnosis not present

## 2016-07-22 DIAGNOSIS — T8571XA Infection and inflammatory reaction due to peritoneal dialysis catheter, initial encounter: Secondary | ICD-10-CM | POA: Diagnosis not present

## 2016-07-22 DIAGNOSIS — Z992 Dependence on renal dialysis: Secondary | ICD-10-CM | POA: Diagnosis not present

## 2016-07-22 DIAGNOSIS — Z79899 Other long term (current) drug therapy: Secondary | ICD-10-CM | POA: Diagnosis not present

## 2016-07-22 DIAGNOSIS — R1084 Generalized abdominal pain: Secondary | ICD-10-CM | POA: Diagnosis present

## 2016-07-22 DIAGNOSIS — I120 Hypertensive chronic kidney disease with stage 5 chronic kidney disease or end stage renal disease: Secondary | ICD-10-CM | POA: Diagnosis not present

## 2016-07-22 DIAGNOSIS — N186 End stage renal disease: Secondary | ICD-10-CM | POA: Insufficient documentation

## 2016-07-22 DIAGNOSIS — Z4932 Encounter for adequacy testing for peritoneal dialysis: Secondary | ICD-10-CM | POA: Diagnosis not present

## 2016-07-22 HISTORY — DX: Dependence on renal dialysis: Z99.2

## 2016-07-22 LAB — BODY FLUID CELL COUNT WITH DIFFERENTIAL
EOS FL: 0 %
Lymphs, Fluid: 29 %
MONOCYTE-MACROPHAGE-SEROUS FLUID: 9 % — AB (ref 50–90)
Neutrophil Count, Fluid: 62 % — ABNORMAL HIGH (ref 0–25)
OTHER CELLS FL: 0 %
Total Nucleated Cell Count, Fluid: 530 cu mm (ref 0–1000)

## 2016-07-22 LAB — GRAM STAIN

## 2016-07-22 MED ORDER — VANCOMYCIN HCL 10 G IV SOLR
1500.0000 mg | Freq: Once | INTRAVENOUS | Status: AC
  Administered 2016-07-22: 1500 mg via INTRAVENOUS
  Filled 2016-07-22: qty 1500

## 2016-07-22 MED ORDER — SODIUM CHLORIDE 0.9 % IV SOLN
INTRAVENOUS | Status: AC
  Filled 2016-07-22: qty 1500

## 2016-07-22 MED ORDER — DEXTROSE 5 % IV SOLN
INTRAVENOUS | Status: AC
  Filled 2016-07-22: qty 1

## 2016-07-22 MED ORDER — DEXTROSE 5 % IV SOLN
1.0000 g | Freq: Once | INTRAVENOUS | Status: AC
  Administered 2016-07-22: 1 g via INTRAVENOUS
  Filled 2016-07-22: qty 1

## 2016-07-22 NOTE — ED Triage Notes (Signed)
Pt sent over by dialysis RN for cloudy peritoneal fluid and abdominal pain. Pt also reports bilateral lower extremity edema. Denies fevers.

## 2016-07-22 NOTE — ED Provider Notes (Signed)
[facility]-EMERGENCY DEPT Provider Note   CSN: [id number] Arrival date & time: 07/22/16  1726     History   Chief Complaint Chief Complaint  Patient presents with  . Abdominal Pain    HPI James Kim is a 41 y.o. male.Complains of diffuse abdominal pain since today. Has noticed that peroneal dialysate fluid is cloudy. He called his dialysis nurse who advised him to come to the emergency department. He denies fever denies vomiting. So complains of bilateral leg edema for the past one week which has improved with time. No other associated symptoms. No treatment prior to coming here.  HPI  Past Medical History:  Diagnosis Date  . Arthritis   . Asthma   . 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 Hypertension   . Peritoneal dialysis catheter in place [facility])     Patient Active Problem List   Diagnosis Date Noted  . Nerve pain 08/30/2015  . Renal dialysis status 02/23/2014  . Obstructive sleep apnea of adult 12/22/2013  . Low serum cobalamin 12/22/2013  . Acid reflux 12/22/2013  . Dyslipidemia 12/22/2013  . Adiposity 12/19/2013  . BP (high blood pressure) 12/19/2013  . Focal and segmental hyalinosis 12/19/2013  . End stage kidney disease (HCC) 12/19/2013  . Chronic kidney disease 04/11/2013  . Gout, arthropathy 03/05/2013  . Rheumatoid arthritis (HCC) 03/05/2013  . Plantar fasciitis, bilateral 03/05/2013    Past Surgical History:  Procedure Laterality Date  . INSERTION OF DIALYSIS CATHETER     peritoneal dialysis       Home Medications    Prior to Admission medications   Medication Sig Start Date End Date Taking? Authorizing Provider  ALPRAZolam Prudy Feeler) 0.5 MG tablet TAKE ONE TABLET BY MOUTH TWO TIMES A DAY AS NEEDED. 06/16/16   Jones Bales, NP  amLODipine (NORVASC) 10 MG tablet Take 10 mg by mouth daily.    Historical Provider, MD  escitalopram (LEXAPRO) 10 MG tablet Take 10 mg by mouth at bedtime.    Historical Provider, MD  furosemide (LASIX)  20 MG tablet Take 20 mg by mouth.    Historical Provider, MD  hydrOXYzine (ATARAX/VISTARIL) 25 MG tablet Take 1 tablet (25 mg total) by mouth every 6 (six) hours. 10/02/14   Tammy Triplett, PA-C  metoprolol (LOPRESSOR) 100 MG tablet Take 100 mg by mouth.    Historical Provider, MD  NONFORMULARY OR COMPOUNDED ITEM Apply 60 g topically 3 (three) times daily. 20% ketoprofen, 8%ketamine, 5% amitriptyline, 5% gabapentin  60 gm supply  Apply to feet 11/24/15   Ranelle Oyster, MD  oxyCODONE (OXYCONTIN) 20 mg 12 hr tablet Take 1 tablet (20 mg total) by mouth every 12 (twelve) hours. 07/10/16   Jones Bales, NP  oxyCODONE-acetaminophen (PERCOCET) 10-325 MG tablet Take 1 tablet by mouth every 6 (six) hours as needed for pain. 07/10/16   Jones Bales, NP  pantoprazole (PROTONIX) 40 MG tablet Take 40 mg by mouth daily before breakfast.    Historical Provider, MD  permethrin (ELIMITE) 5 % cream Apply to most of the body.  Massage into the skin, leave on for 8-10 hrs then wash off.  May repeat in 7 days if needed. 10/02/14   Tammy Triplett, PA-C  pregabalin (LYRICA) 25 MG capsule Take 1 capsule (25 mg total) by mouth 2 (two) times daily. AM WITH BREAKFAST AND AT LUNCHTIME 01/19/16   Jones Bales, NP  traZODone (DESYREL) 50 MG tablet Take 50 mg by mouth 2 (  two) times daily at 10 AM and 5 PM. At bedtime    Historical Provider, MD  vitamin C (ASCORBIC ACID) 500 MG tablet Take 500 mg by mouth daily.    Historical Provider, MD  Vitamin D, Ergocalciferol, (DRISDOL) 50000 UNITS CAPS capsule Take 50,000 Units by mouth every 7 (seven) days.    Historical Provider, MD    Family History Family History  Problem Relation Age of Onset  . Stroke Mother   . Diabetes Father     Social History Social History  Substance Use Topics  . Smoking status: Never Smoker  . Smokeless tobacco: Current User  . Alcohol use Yes     Allergies   Carbatrol [carbamazepine]; Sulfa antibiotics; and Sulfur   Review of  Systems Review of Systems  Constitutional: Negative.   HENT: Negative.   Respiratory: Negative.   Cardiovascular: Positive for leg swelling.  Gastrointestinal: Positive for abdominal pain.  Musculoskeletal: Negative.   Skin: Negative.   Allergic/Immunologic: Positive for immunocompromised state.  Neurological: Negative.   Psychiatric/Behavioral: Negative.   All other systems reviewed and are negative.    Physical Exam Updated Vital Signs BP (!) 159/76 (BP Location: Right Arm)   Pulse (!) 125   Temp 98.9 F (37.2 C) (Oral)   Resp 16   Ht 5\' 6"  (1.676 m)   Wt 212 lb (96.2 kg)   SpO2 90%   BMI 34.22 kg/m   Physical Exam  Constitutional: He appears well-developed and well-nourished. No distress.  HENT:  Head: Normocephalic and atraumatic.  Eyes: Conjunctivae are normal. Pupils are equal, round, and reactive to light.  Neck: Neck supple. No tracheal deviation present. No thyromegaly present.  Cardiovascular: Normal rate and regular rhythm.   No murmur heard. Rate counted at 92 bpm by me  Pulmonary/Chest: Effort normal and breath sounds normal.  Abdominal: Soft. Bowel sounds are normal. He exhibits no distension. There is no tenderness. There is no guarding.  Dialysis catheter in place. Insertion site is not red or warm.. Minimally diffuse tenderness  Genitourinary: Penis normal.  Musculoskeletal: Normal range of motion. He exhibits edema. He exhibits no tenderness.  TRACE pretibial pitting edema bilaterally  Neurological: He is alert. Coordination normal.  Skin: Skin is warm and dry. No rash noted.  Psychiatric: He has a normal mood and affect.  Nursing note and vitals reviewed.    ED Treatments / Results  Labs (all labs ordered are listed, but only abnormal results are displayed) Labs Reviewed  BODY FLUID CULTURE  GRAM STAIN  BODY FLUID CELL COUNT WITH DIFFERENTIAL    EKG  EKG Interpretation None       Radiology No results  found.  Procedures Procedures (including critical care time)  Medications Ordered in ED Medications  vancomycin (VANCOCIN) 1,500 mg in sodium chloride 0.9 % 500 mL IVPB (not administered)  cefTAZidime (FORTAZ) 1 g in dextrose 5 % 50 mL IVPB (not administered)   I spoke with patient's dialysis nurse Ms. who suggests Fortaz 1 g IV and vancomycin 1.5 gram IV. Peritoneal fluid sent for culture, Gram stain, cell count. She will check results and report to his nephrologist Dr. Wynema Birch. Patient can be discharged after IV antibiotics administered to follow up in 2 days. Results for orders placed or performed during the hospital encounter of 07/22/16  Gram stain  Result Value Ref Range   Specimen Description PERITONEAL DIALYSATE    Special Requests Immunocompromised    Gram Stain      CYTOSPIN SMEAR NO  ORGANISMS SEEN WBC PRESENT,BOTH PMN AND MONONUCLEAR   Report Status 07/22/2016 FINAL   Body fluid cell count with differential  Result Value Ref Range   Fluid Type-FCT Peritoneal    Color, Fluid YELOW    Appearance, Fluid CLEAR (A) CLEAR   WBC, Fluid 530 0 - 1,000 cu mm   Neutrophil Count, Fluid 62 (H) 0 - 25 %   Lymphs, Fluid 29 %   Monocyte-Macrophage-Serous Fluid 9 (L) 50 - 90 %   Eos, Fluid 0 %   Other Cells, Fluid 0 %   No results found. Initial Impression / Assessment and Plan / ED Course  I have reviewed the triage vital signs and the nursing notes.  Pertinent labs & imaging results that were available during my care of the patient were reviewed by me and considered in my medical decision making (see chart for details).     10:20 PM patient resting comfortably. Antibiotics infusing. Patient signed out to Dr. Ranae Palms. Plan is for discharge and follow-up with his nephrologist in 2 days  Final Clinical Impressions(s) / ED Diagnoses  Diagnosis peritonitis Final diagnoses:  None    New Prescriptions New Prescriptions   No medications on file     Doug Sou, MD 07/22/16 2223

## 2016-07-22 NOTE — Discharge Instructions (Signed)
Keep your scheduled appointment with your kidney doctor in 2 days

## 2016-07-22 NOTE — ED Notes (Signed)
Pt states abdominal pain. Pt says hazy fluid from mid day exchange. Pt took 20mg  oxy about 30 minutes ago. Pt says makes very little urine.

## 2016-07-23 DIAGNOSIS — N186 End stage renal disease: Secondary | ICD-10-CM | POA: Diagnosis not present

## 2016-07-23 DIAGNOSIS — D631 Anemia in chronic kidney disease: Secondary | ICD-10-CM | POA: Diagnosis not present

## 2016-07-23 DIAGNOSIS — T8571XA Infection and inflammatory reaction due to peritoneal dialysis catheter, initial encounter: Secondary | ICD-10-CM | POA: Diagnosis not present

## 2016-07-23 DIAGNOSIS — Z4932 Encounter for adequacy testing for peritoneal dialysis: Secondary | ICD-10-CM | POA: Diagnosis not present

## 2016-07-23 DIAGNOSIS — E875 Hyperkalemia: Secondary | ICD-10-CM | POA: Diagnosis not present

## 2016-07-23 DIAGNOSIS — K659 Peritonitis, unspecified: Secondary | ICD-10-CM | POA: Diagnosis not present

## 2016-07-24 DIAGNOSIS — K659 Peritonitis, unspecified: Secondary | ICD-10-CM | POA: Diagnosis not present

## 2016-07-24 DIAGNOSIS — D631 Anemia in chronic kidney disease: Secondary | ICD-10-CM | POA: Diagnosis not present

## 2016-07-24 DIAGNOSIS — Z4932 Encounter for adequacy testing for peritoneal dialysis: Secondary | ICD-10-CM | POA: Diagnosis not present

## 2016-07-24 DIAGNOSIS — E875 Hyperkalemia: Secondary | ICD-10-CM | POA: Diagnosis not present

## 2016-07-24 DIAGNOSIS — N186 End stage renal disease: Secondary | ICD-10-CM | POA: Diagnosis not present

## 2016-07-24 DIAGNOSIS — T8571XA Infection and inflammatory reaction due to peritoneal dialysis catheter, initial encounter: Secondary | ICD-10-CM | POA: Diagnosis not present

## 2016-07-24 LAB — PATHOLOGIST SMEAR REVIEW

## 2016-07-25 DIAGNOSIS — Z5181 Encounter for therapeutic drug level monitoring: Secondary | ICD-10-CM | POA: Diagnosis not present

## 2016-07-25 DIAGNOSIS — K659 Peritonitis, unspecified: Secondary | ICD-10-CM | POA: Diagnosis not present

## 2016-07-25 DIAGNOSIS — E875 Hyperkalemia: Secondary | ICD-10-CM | POA: Diagnosis not present

## 2016-07-25 DIAGNOSIS — N186 End stage renal disease: Secondary | ICD-10-CM | POA: Diagnosis not present

## 2016-07-25 DIAGNOSIS — T8571XA Infection and inflammatory reaction due to peritoneal dialysis catheter, initial encounter: Secondary | ICD-10-CM | POA: Diagnosis not present

## 2016-07-25 DIAGNOSIS — D631 Anemia in chronic kidney disease: Secondary | ICD-10-CM | POA: Diagnosis not present

## 2016-07-25 DIAGNOSIS — Z4932 Encounter for adequacy testing for peritoneal dialysis: Secondary | ICD-10-CM | POA: Diagnosis not present

## 2016-07-26 DIAGNOSIS — E875 Hyperkalemia: Secondary | ICD-10-CM | POA: Diagnosis not present

## 2016-07-26 DIAGNOSIS — D631 Anemia in chronic kidney disease: Secondary | ICD-10-CM | POA: Diagnosis not present

## 2016-07-26 DIAGNOSIS — T8571XA Infection and inflammatory reaction due to peritoneal dialysis catheter, initial encounter: Secondary | ICD-10-CM | POA: Diagnosis not present

## 2016-07-26 DIAGNOSIS — K659 Peritonitis, unspecified: Secondary | ICD-10-CM | POA: Diagnosis not present

## 2016-07-26 DIAGNOSIS — Z4932 Encounter for adequacy testing for peritoneal dialysis: Secondary | ICD-10-CM | POA: Diagnosis not present

## 2016-07-26 DIAGNOSIS — N186 End stage renal disease: Secondary | ICD-10-CM | POA: Diagnosis not present

## 2016-07-26 NOTE — ED Notes (Signed)
No answer.  Left message to call ER.

## 2016-07-26 NOTE — ED Notes (Signed)
Left voicemail regarding positive cultures of peritoneal fluid.

## 2016-07-27 DIAGNOSIS — Z4932 Encounter for adequacy testing for peritoneal dialysis: Secondary | ICD-10-CM | POA: Diagnosis not present

## 2016-07-27 DIAGNOSIS — N186 End stage renal disease: Secondary | ICD-10-CM | POA: Diagnosis not present

## 2016-07-27 DIAGNOSIS — E875 Hyperkalemia: Secondary | ICD-10-CM | POA: Diagnosis not present

## 2016-07-27 DIAGNOSIS — T8571XA Infection and inflammatory reaction due to peritoneal dialysis catheter, initial encounter: Secondary | ICD-10-CM | POA: Diagnosis not present

## 2016-07-27 DIAGNOSIS — K659 Peritonitis, unspecified: Secondary | ICD-10-CM | POA: Diagnosis not present

## 2016-07-27 DIAGNOSIS — D631 Anemia in chronic kidney disease: Secondary | ICD-10-CM | POA: Diagnosis not present

## 2016-07-28 DIAGNOSIS — E875 Hyperkalemia: Secondary | ICD-10-CM | POA: Diagnosis not present

## 2016-07-28 DIAGNOSIS — Z5181 Encounter for therapeutic drug level monitoring: Secondary | ICD-10-CM | POA: Diagnosis not present

## 2016-07-28 DIAGNOSIS — Z4932 Encounter for adequacy testing for peritoneal dialysis: Secondary | ICD-10-CM | POA: Diagnosis not present

## 2016-07-28 DIAGNOSIS — D631 Anemia in chronic kidney disease: Secondary | ICD-10-CM | POA: Diagnosis not present

## 2016-07-28 DIAGNOSIS — N186 End stage renal disease: Secondary | ICD-10-CM | POA: Diagnosis not present

## 2016-07-28 DIAGNOSIS — K659 Peritonitis, unspecified: Secondary | ICD-10-CM | POA: Diagnosis not present

## 2016-07-28 DIAGNOSIS — T8571XA Infection and inflammatory reaction due to peritoneal dialysis catheter, initial encounter: Secondary | ICD-10-CM | POA: Diagnosis not present

## 2016-07-28 LAB — CULTURE, BODY FLUID-BOTTLE

## 2016-07-28 LAB — CULTURE, BODY FLUID W GRAM STAIN -BOTTLE

## 2016-07-29 DIAGNOSIS — Z4932 Encounter for adequacy testing for peritoneal dialysis: Secondary | ICD-10-CM | POA: Diagnosis not present

## 2016-07-29 DIAGNOSIS — K659 Peritonitis, unspecified: Secondary | ICD-10-CM | POA: Diagnosis not present

## 2016-07-29 DIAGNOSIS — D631 Anemia in chronic kidney disease: Secondary | ICD-10-CM | POA: Diagnosis not present

## 2016-07-29 DIAGNOSIS — E875 Hyperkalemia: Secondary | ICD-10-CM | POA: Diagnosis not present

## 2016-07-29 DIAGNOSIS — N186 End stage renal disease: Secondary | ICD-10-CM | POA: Diagnosis not present

## 2016-07-29 DIAGNOSIS — T8571XA Infection and inflammatory reaction due to peritoneal dialysis catheter, initial encounter: Secondary | ICD-10-CM | POA: Diagnosis not present

## 2016-07-30 DIAGNOSIS — N186 End stage renal disease: Secondary | ICD-10-CM | POA: Diagnosis not present

## 2016-07-30 DIAGNOSIS — Z4932 Encounter for adequacy testing for peritoneal dialysis: Secondary | ICD-10-CM | POA: Diagnosis not present

## 2016-07-30 DIAGNOSIS — D631 Anemia in chronic kidney disease: Secondary | ICD-10-CM | POA: Diagnosis not present

## 2016-07-30 DIAGNOSIS — K659 Peritonitis, unspecified: Secondary | ICD-10-CM | POA: Diagnosis not present

## 2016-07-30 DIAGNOSIS — T8571XA Infection and inflammatory reaction due to peritoneal dialysis catheter, initial encounter: Secondary | ICD-10-CM | POA: Diagnosis not present

## 2016-07-30 DIAGNOSIS — E875 Hyperkalemia: Secondary | ICD-10-CM | POA: Diagnosis not present

## 2016-07-31 DIAGNOSIS — T8571XA Infection and inflammatory reaction due to peritoneal dialysis catheter, initial encounter: Secondary | ICD-10-CM | POA: Diagnosis not present

## 2016-07-31 DIAGNOSIS — Z4932 Encounter for adequacy testing for peritoneal dialysis: Secondary | ICD-10-CM | POA: Diagnosis not present

## 2016-07-31 DIAGNOSIS — E875 Hyperkalemia: Secondary | ICD-10-CM | POA: Diagnosis not present

## 2016-07-31 DIAGNOSIS — K659 Peritonitis, unspecified: Secondary | ICD-10-CM | POA: Diagnosis not present

## 2016-07-31 DIAGNOSIS — N186 End stage renal disease: Secondary | ICD-10-CM | POA: Diagnosis not present

## 2016-07-31 DIAGNOSIS — D631 Anemia in chronic kidney disease: Secondary | ICD-10-CM | POA: Diagnosis not present

## 2016-08-01 DIAGNOSIS — D631 Anemia in chronic kidney disease: Secondary | ICD-10-CM | POA: Diagnosis not present

## 2016-08-01 DIAGNOSIS — N186 End stage renal disease: Secondary | ICD-10-CM | POA: Diagnosis not present

## 2016-08-01 DIAGNOSIS — T8571XA Infection and inflammatory reaction due to peritoneal dialysis catheter, initial encounter: Secondary | ICD-10-CM | POA: Diagnosis not present

## 2016-08-01 DIAGNOSIS — E875 Hyperkalemia: Secondary | ICD-10-CM | POA: Diagnosis not present

## 2016-08-01 DIAGNOSIS — K659 Peritonitis, unspecified: Secondary | ICD-10-CM | POA: Diagnosis not present

## 2016-08-01 DIAGNOSIS — Z4932 Encounter for adequacy testing for peritoneal dialysis: Secondary | ICD-10-CM | POA: Diagnosis not present

## 2016-08-02 DIAGNOSIS — E875 Hyperkalemia: Secondary | ICD-10-CM | POA: Diagnosis not present

## 2016-08-02 DIAGNOSIS — N186 End stage renal disease: Secondary | ICD-10-CM | POA: Diagnosis not present

## 2016-08-02 DIAGNOSIS — T8571XA Infection and inflammatory reaction due to peritoneal dialysis catheter, initial encounter: Secondary | ICD-10-CM | POA: Diagnosis not present

## 2016-08-02 DIAGNOSIS — D631 Anemia in chronic kidney disease: Secondary | ICD-10-CM | POA: Diagnosis not present

## 2016-08-02 DIAGNOSIS — K659 Peritonitis, unspecified: Secondary | ICD-10-CM | POA: Diagnosis not present

## 2016-08-02 DIAGNOSIS — Z4932 Encounter for adequacy testing for peritoneal dialysis: Secondary | ICD-10-CM | POA: Diagnosis not present

## 2016-08-03 DIAGNOSIS — K659 Peritonitis, unspecified: Secondary | ICD-10-CM | POA: Diagnosis not present

## 2016-08-03 DIAGNOSIS — Z4932 Encounter for adequacy testing for peritoneal dialysis: Secondary | ICD-10-CM | POA: Diagnosis not present

## 2016-08-03 DIAGNOSIS — N186 End stage renal disease: Secondary | ICD-10-CM | POA: Diagnosis not present

## 2016-08-03 DIAGNOSIS — T8571XA Infection and inflammatory reaction due to peritoneal dialysis catheter, initial encounter: Secondary | ICD-10-CM | POA: Diagnosis not present

## 2016-08-03 DIAGNOSIS — D631 Anemia in chronic kidney disease: Secondary | ICD-10-CM | POA: Diagnosis not present

## 2016-08-03 DIAGNOSIS — E875 Hyperkalemia: Secondary | ICD-10-CM | POA: Diagnosis not present

## 2016-08-03 HISTORY — PX: DIALYSIS/PERMA CATHETER INSERTION: CATH118288

## 2016-08-04 DIAGNOSIS — E875 Hyperkalemia: Secondary | ICD-10-CM | POA: Diagnosis not present

## 2016-08-04 DIAGNOSIS — D631 Anemia in chronic kidney disease: Secondary | ICD-10-CM | POA: Diagnosis not present

## 2016-08-04 DIAGNOSIS — T8571XA Infection and inflammatory reaction due to peritoneal dialysis catheter, initial encounter: Secondary | ICD-10-CM | POA: Diagnosis not present

## 2016-08-04 DIAGNOSIS — K659 Peritonitis, unspecified: Secondary | ICD-10-CM | POA: Diagnosis not present

## 2016-08-04 DIAGNOSIS — N186 End stage renal disease: Secondary | ICD-10-CM | POA: Diagnosis not present

## 2016-08-04 DIAGNOSIS — Z4932 Encounter for adequacy testing for peritoneal dialysis: Secondary | ICD-10-CM | POA: Diagnosis not present

## 2016-08-05 DIAGNOSIS — Z992 Dependence on renal dialysis: Secondary | ICD-10-CM | POA: Diagnosis not present

## 2016-08-05 DIAGNOSIS — D631 Anemia in chronic kidney disease: Secondary | ICD-10-CM | POA: Diagnosis not present

## 2016-08-05 DIAGNOSIS — T8571XA Infection and inflammatory reaction due to peritoneal dialysis catheter, initial encounter: Secondary | ICD-10-CM | POA: Diagnosis not present

## 2016-08-05 DIAGNOSIS — Z4932 Encounter for adequacy testing for peritoneal dialysis: Secondary | ICD-10-CM | POA: Diagnosis not present

## 2016-08-05 DIAGNOSIS — E875 Hyperkalemia: Secondary | ICD-10-CM | POA: Diagnosis not present

## 2016-08-05 DIAGNOSIS — K659 Peritonitis, unspecified: Secondary | ICD-10-CM | POA: Diagnosis not present

## 2016-08-05 DIAGNOSIS — N186 End stage renal disease: Secondary | ICD-10-CM | POA: Diagnosis not present

## 2016-08-06 DIAGNOSIS — N186 End stage renal disease: Secondary | ICD-10-CM | POA: Diagnosis not present

## 2016-08-06 DIAGNOSIS — N2581 Secondary hyperparathyroidism of renal origin: Secondary | ICD-10-CM | POA: Diagnosis not present

## 2016-08-06 DIAGNOSIS — Z992 Dependence on renal dialysis: Secondary | ICD-10-CM | POA: Diagnosis not present

## 2016-08-07 DIAGNOSIS — N186 End stage renal disease: Secondary | ICD-10-CM | POA: Diagnosis not present

## 2016-08-07 DIAGNOSIS — N2581 Secondary hyperparathyroidism of renal origin: Secondary | ICD-10-CM | POA: Diagnosis not present

## 2016-08-08 ENCOUNTER — Encounter: Payer: Medicare Other | Admitting: Registered Nurse

## 2016-08-08 ENCOUNTER — Telehealth: Payer: Self-pay | Admitting: *Deleted

## 2016-08-08 ENCOUNTER — Inpatient Hospital Stay (HOSPITAL_COMMUNITY)
Admission: AD | Admit: 2016-08-08 | Discharge: 2016-08-11 | DRG: 981 | Disposition: A | Discharge status: Home / Self Care | Payer: Medicare Other | Source: Ambulatory Visit | Attending: Internal Medicine | Admitting: Internal Medicine

## 2016-08-08 ENCOUNTER — Encounter (HOSPITAL_COMMUNITY): Payer: Self-pay | Admitting: General Practice

## 2016-08-08 ENCOUNTER — Telehealth: Payer: Self-pay | Admitting: Physical Medicine & Rehabilitation

## 2016-08-08 DIAGNOSIS — M069 Rheumatoid arthritis, unspecified: Secondary | ICD-10-CM | POA: Diagnosis present

## 2016-08-08 DIAGNOSIS — Z79899 Other long term (current) drug therapy: Secondary | ICD-10-CM

## 2016-08-08 DIAGNOSIS — D631 Anemia in chronic kidney disease: Secondary | ICD-10-CM | POA: Diagnosis present

## 2016-08-08 DIAGNOSIS — G4733 Obstructive sleep apnea (adult) (pediatric): Secondary | ICD-10-CM | POA: Diagnosis present

## 2016-08-08 DIAGNOSIS — E784 Other hyperlipidemia: Secondary | ICD-10-CM | POA: Diagnosis not present

## 2016-08-08 DIAGNOSIS — K659 Peritonitis, unspecified: Secondary | ICD-10-CM | POA: Diagnosis not present

## 2016-08-08 DIAGNOSIS — M7731 Calcaneal spur, right foot: Secondary | ICD-10-CM | POA: Diagnosis present

## 2016-08-08 DIAGNOSIS — K5732 Diverticulitis of large intestine without perforation or abscess without bleeding: Secondary | ICD-10-CM | POA: Diagnosis present

## 2016-08-08 DIAGNOSIS — M7732 Calcaneal spur, left foot: Secondary | ICD-10-CM | POA: Diagnosis present

## 2016-08-08 DIAGNOSIS — G8929 Other chronic pain: Secondary | ICD-10-CM | POA: Diagnosis present

## 2016-08-08 DIAGNOSIS — T8571XD Infection and inflammatory reaction due to peritoneal dialysis catheter, subsequent encounter: Secondary | ICD-10-CM

## 2016-08-08 DIAGNOSIS — F419 Anxiety disorder, unspecified: Secondary | ICD-10-CM | POA: Diagnosis present

## 2016-08-08 DIAGNOSIS — K5792 Diverticulitis of intestine, part unspecified, without perforation or abscess without bleeding: Secondary | ICD-10-CM

## 2016-08-08 DIAGNOSIS — E8809 Other disorders of plasma-protein metabolism, not elsewhere classified: Secondary | ICD-10-CM | POA: Diagnosis not present

## 2016-08-08 DIAGNOSIS — E1122 Type 2 diabetes mellitus with diabetic chronic kidney disease: Secondary | ICD-10-CM | POA: Diagnosis present

## 2016-08-08 DIAGNOSIS — N186 End stage renal disease: Secondary | ICD-10-CM

## 2016-08-08 DIAGNOSIS — N2581 Secondary hyperparathyroidism of renal origin: Secondary | ICD-10-CM | POA: Diagnosis not present

## 2016-08-08 DIAGNOSIS — Z833 Family history of diabetes mellitus: Secondary | ICD-10-CM

## 2016-08-08 DIAGNOSIS — M109 Gout, unspecified: Secondary | ICD-10-CM | POA: Diagnosis present

## 2016-08-08 DIAGNOSIS — I120 Hypertensive chronic kidney disease with stage 5 chronic kidney disease or end stage renal disease: Secondary | ICD-10-CM | POA: Diagnosis present

## 2016-08-08 DIAGNOSIS — K652 Spontaneous bacterial peritonitis: Secondary | ICD-10-CM | POA: Diagnosis present

## 2016-08-08 DIAGNOSIS — D6959 Other secondary thrombocytopenia: Secondary | ICD-10-CM | POA: Diagnosis not present

## 2016-08-08 DIAGNOSIS — Y838 Other surgical procedures as the cause of abnormal reaction of the patient, or of later complication, without mention of misadventure at the time of the procedure: Secondary | ICD-10-CM | POA: Diagnosis present

## 2016-08-08 DIAGNOSIS — Z79891 Long term (current) use of opiate analgesic: Secondary | ICD-10-CM

## 2016-08-08 DIAGNOSIS — R109 Unspecified abdominal pain: Secondary | ICD-10-CM

## 2016-08-08 DIAGNOSIS — Z992 Dependence on renal dialysis: Secondary | ICD-10-CM

## 2016-08-08 DIAGNOSIS — T8571XA Infection and inflammatory reaction due to peritoneal dialysis catheter, initial encounter: Principal | ICD-10-CM | POA: Diagnosis present

## 2016-08-08 DIAGNOSIS — E8889 Other specified metabolic disorders: Secondary | ICD-10-CM | POA: Diagnosis present

## 2016-08-08 DIAGNOSIS — T85691A Other mechanical complication of intraperitoneal dialysis catheter, initial encounter: Secondary | ICD-10-CM | POA: Diagnosis present

## 2016-08-08 DIAGNOSIS — Z881 Allergy status to other antibiotic agents status: Secondary | ICD-10-CM

## 2016-08-08 DIAGNOSIS — J45909 Unspecified asthma, uncomplicated: Secondary | ICD-10-CM | POA: Diagnosis present

## 2016-08-08 DIAGNOSIS — Z139 Encounter for screening, unspecified: Secondary | ICD-10-CM | POA: Diagnosis not present

## 2016-08-08 DIAGNOSIS — F17200 Nicotine dependence, unspecified, uncomplicated: Secondary | ICD-10-CM | POA: Diagnosis present

## 2016-08-08 DIAGNOSIS — D500 Iron deficiency anemia secondary to blood loss (chronic): Secondary | ICD-10-CM | POA: Diagnosis not present

## 2016-08-08 DIAGNOSIS — K219 Gastro-esophageal reflux disease without esophagitis: Secondary | ICD-10-CM | POA: Diagnosis present

## 2016-08-08 DIAGNOSIS — Z5181 Encounter for therapeutic drug level monitoring: Secondary | ICD-10-CM

## 2016-08-08 DIAGNOSIS — E876 Hypokalemia: Secondary | ICD-10-CM | POA: Diagnosis present

## 2016-08-08 HISTORY — DX: Infection and inflammatory reaction due to peritoneal dialysis catheter, initial encounter: T85.71XA

## 2016-08-08 HISTORY — DX: Dependence on renal dialysis: Z99.2

## 2016-08-08 HISTORY — DX: Calcaneal spur, unspecified foot: M77.30

## 2016-08-08 HISTORY — DX: Anxiety disorder, unspecified: F41.9

## 2016-08-08 HISTORY — DX: Dependence on other enabling machines and devices: Z99.89

## 2016-08-08 HISTORY — DX: Pain, unspecified: R52

## 2016-08-08 HISTORY — DX: Obstructive sleep apnea (adult) (pediatric): G47.33

## 2016-08-08 HISTORY — DX: Gastro-esophageal reflux disease without esophagitis: K21.9

## 2016-08-08 HISTORY — DX: End stage renal disease: N18.6

## 2016-08-08 HISTORY — DX: Unspecified asthma, uncomplicated: J45.909

## 2016-08-08 LAB — COMPREHENSIVE METABOLIC PANEL
ALT: 11 U/L — AB (ref 17–63)
AST: 14 U/L — AB (ref 15–41)
Albumin: 1.9 g/dL — ABNORMAL LOW (ref 3.5–5.0)
Alkaline Phosphatase: 53 U/L (ref 38–126)
Anion gap: 10 (ref 5–15)
BUN: 12 mg/dL (ref 6–20)
CHLORIDE: 100 mmol/L — AB (ref 101–111)
CO2: 29 mmol/L (ref 22–32)
CREATININE: 5.65 mg/dL — AB (ref 0.61–1.24)
Calcium: 7.9 mg/dL — ABNORMAL LOW (ref 8.9–10.3)
GFR calc non Af Amer: 12 mL/min — ABNORMAL LOW (ref >60)
GFR, EST AFRICAN AMERICAN: 13 mL/min — AB (ref >60)
Glucose, Bld: 107 mg/dL — ABNORMAL HIGH (ref 65–99)
POTASSIUM: 3 mmol/L — AB (ref 3.5–5.1)
Sodium: 139 mmol/L (ref 135–145)
Total Bilirubin: 0.4 mg/dL (ref 0.3–1.2)
Total Protein: 5.3 g/dL — ABNORMAL LOW (ref 6.5–8.1)

## 2016-08-08 LAB — CBC WITH DIFFERENTIAL/PLATELET
BASOS PCT: 0 %
Basophils Absolute: 0 10*3/uL (ref 0.0–0.1)
EOS PCT: 0 %
Eosinophils Absolute: 0 10*3/uL (ref 0.0–0.7)
HCT: 29.2 % — ABNORMAL LOW (ref 39.0–52.0)
Hemoglobin: 9.8 g/dL — ABNORMAL LOW (ref 13.0–17.0)
Lymphocytes Relative: 13 %
Lymphs Abs: 1 10*3/uL (ref 0.7–4.0)
MCH: 28.5 pg (ref 26.0–34.0)
MCHC: 33.6 g/dL (ref 30.0–36.0)
MCV: 84.9 fL (ref 78.0–100.0)
MONO ABS: 0.7 10*3/uL (ref 0.1–1.0)
MONOS PCT: 9 %
NEUTROS PCT: 78 %
Neutro Abs: 6.1 10*3/uL (ref 1.7–7.7)
Platelets: 200 10*3/uL (ref 150–400)
RBC: 3.44 MIL/uL — ABNORMAL LOW (ref 4.22–5.81)
RDW: 14.3 % (ref 11.5–15.5)
WBC: 7.8 10*3/uL (ref 4.0–10.5)

## 2016-08-08 MED ORDER — METOPROLOL TARTRATE 100 MG PO TABS
100.0000 mg | ORAL_TABLET | Freq: Two times a day (BID) | ORAL | Status: DC
  Administered 2016-08-08 – 2016-08-11 (×6): 100 mg via ORAL
  Filled 2016-08-08 (×6): qty 2

## 2016-08-08 MED ORDER — HYDROMORPHONE HCL 1 MG/ML IJ SOLN
0.5000 mg | INTRAMUSCULAR | Status: DC | PRN
  Administered 2016-08-09 – 2016-08-11 (×10): 1 mg via INTRAVENOUS
  Filled 2016-08-08 (×10): qty 1

## 2016-08-08 MED ORDER — OXYCODONE-ACETAMINOPHEN 10-325 MG PO TABS: 1.0000 | ORAL_TABLET | Freq: Every day | ORAL | 0 refills | Status: DC | PRN

## 2016-08-08 MED ORDER — OXYCODONE HCL ER 10 MG PO T12A
20.0000 mg | EXTENDED_RELEASE_TABLET | Freq: Two times a day (BID) | ORAL | Status: DC
  Administered 2016-08-08 – 2016-08-11 (×6): 20 mg via ORAL
  Filled 2016-08-08 (×6): qty 2

## 2016-08-08 MED ORDER — PREGABALIN 25 MG PO CAPS
25.0000 mg | ORAL_CAPSULE | Freq: Two times a day (BID) | ORAL | Status: DC | PRN
  Administered 2016-08-08: 25 mg via ORAL
  Filled 2016-08-08: qty 1

## 2016-08-08 MED ORDER — PIPERACILLIN-TAZOBACTAM 3.375 G IVPB
3.3750 g | Freq: Two times a day (BID) | INTRAVENOUS | Status: DC
  Administered 2016-08-09 – 2016-08-11 (×6): 3.375 g via INTRAVENOUS
  Filled 2016-08-08 (×7): qty 50

## 2016-08-08 MED ORDER — OXYCODONE HCL ER 20 MG PO T12A: 20.0000 mg | EXTENDED_RELEASE_TABLET | Freq: Two times a day (BID) | ORAL | 0 refills | Status: DC

## 2016-08-08 MED ORDER — HYDROXYZINE HCL 25 MG PO TABS: 25.0000 mg | ORAL_TABLET | Freq: Four times a day (QID) | ORAL | Status: DC | PRN

## 2016-08-08 MED ORDER — OXYCODONE-ACETAMINOPHEN 10-325 MG PO TABS: 1.0000 | ORAL_TABLET | Freq: Every day | ORAL | Status: DC | PRN

## 2016-08-08 MED ORDER — AMLODIPINE BESYLATE 10 MG PO TABS
10.0000 mg | ORAL_TABLET | Freq: Every day | ORAL | Status: DC
  Administered 2016-08-09 – 2016-08-11 (×3): 10 mg via ORAL
  Filled 2016-08-08 (×3): qty 1

## 2016-08-08 MED ORDER — ESCITALOPRAM OXALATE 10 MG PO TABS
10.0000 mg | ORAL_TABLET | Freq: Every day | ORAL | Status: DC
  Administered 2016-08-08 – 2016-08-10 (×3): 10 mg via ORAL
  Filled 2016-08-08 (×3): qty 1

## 2016-08-08 MED ORDER — PANTOPRAZOLE SODIUM 40 MG PO TBEC
40.0000 mg | DELAYED_RELEASE_TABLET | Freq: Every day | ORAL | Status: DC
Start: 2016-08-09 — End: 2016-08-11
  Administered 2016-08-09 – 2016-08-11 (×3): 40 mg via ORAL
  Filled 2016-08-08 (×3): qty 1

## 2016-08-08 MED ORDER — ALPRAZOLAM 0.5 MG PO TABS
0.5000 mg | ORAL_TABLET | Freq: Two times a day (BID) | ORAL | Status: DC | PRN
  Administered 2016-08-08: 0.5 mg via ORAL
  Filled 2016-08-08: qty 1

## 2016-08-08 MED ORDER — FUROSEMIDE 40 MG PO TABS: 40.0000 mg | ORAL_TABLET | Freq: Two times a day (BID) | ORAL | Status: DC

## 2016-08-08 MED ORDER — NEPRO/CARBSTEADY PO LIQD
237.0000 mL | Freq: Two times a day (BID) | ORAL | Status: DC
  Administered 2016-08-09 – 2016-08-10 (×3): 237 mL via ORAL

## 2016-08-08 MED ORDER — VITAMIN D (ERGOCALCIFEROL) 1.25 MG (50000 UNIT) PO CAPS
50000.0000 [IU] | ORAL_CAPSULE | ORAL | Status: DC
  Administered 2016-08-09: 50000 [IU] via ORAL
  Filled 2016-08-08: qty 1

## 2016-08-08 NOTE — Telephone Encounter (Signed)
Patient called to cancel visit today with [facility].  May have to be admitted to hospital to have catheter taken out.  Patient not sure what to do about medication, said he has been taking more than he was supposed to.  Wife will call us back once she has more information about hospitalization.

## 2016-08-08 NOTE — Telephone Encounter (Signed)
Wife called and let us know Montrey was being admitted to [facility] tonight and they will pull his tube tomorrow.  I let  Her know that Rx's have been printed and she will just need to get him a return appt.

## 2016-08-08 NOTE — H&P (Signed)
History and Physical    James Kim:[id number] DOB: [birth year] DOA: 08/08/2016  PCP: Pcp Not In System  Patient coming from: Home  I have personally briefly reviewed patient's old medical records in [facility]  Chief Complaint: Recurrent peritonitis  HPI: James Kim is a 41 y.o. male with medical history significant of ESRD, patient is on chronic PD.  He unfortunately has had 3 bouts of recurrent bacterial peritonitis over the past couple of months.  He has been transitioned to HD starting today.  He is being sent in to the hospital with plans to remove the PD catheter by surgery tomorrow as this is felt to be infected and likely the source of his recurrent peritonitis.   Review of Systems: As per HPI otherwise 10 point review of systems negative.   Past Medical History:  Diagnosis Date  . Arthritis   . Asthma   . 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 Hypertension   . Peritoneal dialysis catheter in place [facility])     Past Surgical History:  Procedure Laterality Date  . INSERTION OF DIALYSIS CATHETER     peritoneal dialysis     reports that he has never smoked. He uses smokeless tobacco. He reports that he drinks alcohol. He reports that he does not use drugs.  Allergies  Allergen Reactions  . Sulfa Antibiotics Anaphylaxis, Itching and Rash    Family History  Problem Relation Age of Onset  . Stroke Mother   . Diabetes Father     Prior to Admission medications   Medication Sig Start Date End Date Taking? Authorizing Provider  ALPRAZolam (XANAX) 0.5 MG tablet TAKE ONE TABLET BY MOUTH TWO TIMES A DAY AS NEEDED. Patient taking differently: Take 0.5 mg by mouth 2 (two) times daily as needed for anxiety.  06/16/16   Jones Bales, NP  amLODipine (NORVASC) 10 MG tablet Take 10 mg by mouth daily.    Historical Provider, MD  escitalopram (LEXAPRO) 10 MG tablet Take 10 mg by mouth at bedtime.    Historical Provider, MD  furosemide (LASIX) 20  MG tablet Take 40 mg by mouth 2 (two) times daily.     Historical Provider, MD  hydrOXYzine (ATARAX/VISTARIL) 25 MG tablet Take 1 tablet (25 mg total) by mouth every 6 (six) hours. Patient taking differently: Take 25 mg by mouth every 6 (six) hours as needed for anxiety.  10/02/14   Tammy Triplett, PA-C  metoprolol (LOPRESSOR) 100 MG tablet Take 100 mg by mouth 2 (two) times daily.     Historical Provider, MD  oxyCODONE (OXYCONTIN) 20 mg 12 hr tablet Take 1 tablet (20 mg total) by mouth every 12 (twelve) hours. 08/08/16   Jones Bales, NP  oxyCODONE-acetaminophen (PERCOCET) 10-325 MG tablet Take 1 tablet by mouth 5 (five) times daily as needed for pain. 08/08/16   Jones Bales, NP  pantoprazole (PROTONIX) 40 MG tablet Take 40 mg by mouth daily before breakfast.    Historical Provider, MD  pregabalin (LYRICA) 25 MG capsule Take 1 capsule (25 mg total) by mouth 2 (two) times daily. AM WITH BREAKFAST AND AT LUNCHTIME Patient taking differently: Take 25 mg by mouth 2 (two) times daily as needed (for fibromyalgia flare-up).  01/19/16   Jones Bales, NP  vitamin C (ASCORBIC ACID) 500 MG tablet Take 500 mg by mouth daily.    Historical Provider, MD  Vitamin D, Ergocalciferol, (DRISDOL) 50000 UNITS CAPS capsule Take 50,000  Units by mouth every Wednesday.     Historical Provider, MD    Physical Exam: Vitals:   08/08/16 1854 08/08/16 1900  BP: 127/78   Pulse: (!) 104   Resp: 20   Temp: 99.9 F (37.7 C)   TempSrc: Oral   SpO2: 97%   Weight:  85.5 kg (188 lb 6.4 oz)  Height:  5\' 6"  (1.676 m)    Constitutional: NAD, calm, comfortable Eyes: PERRL, lids and conjunctivae normal ENMT: Mucous membranes are moist. Posterior pharynx clear of any exudate or lesions.Normal dentition.  Neck: normal, supple, no masses, no thyromegaly Respiratory: clear to auscultation bilaterally, no wheezing, no crackles. Normal respiratory effort. No accessory muscle use.  Cardiovascular: Regular rate and rhythm, no  murmurs / rubs / gallops. No extremity edema. 2+ pedal pulses. No carotid bruits.  Abdomen: no tenderness, no masses palpated. No hepatosplenomegaly. Bowel sounds positive.  Musculoskeletal: no clubbing / cyanosis. No joint deformity upper and lower extremities. Good ROM, no contractures. Normal muscle tone.  Skin: no rashes, lesions, ulcers. No induration Neurologic: CN 2-12 grossly intact. Sensation intact, DTR normal. Strength 5/5 in all 4.  Psychiatric: Normal judgment and insight. Alert and oriented x 3. Normal mood.    Labs on Admission: I have personally reviewed following labs and imaging studies  CBC: No results for input(s): WBC, NEUTROABS, HGB, HCT, MCV, PLT in the last 168 hours. Basic Metabolic Panel: No results for input(s): NA, K, CL, CO2, GLUCOSE, BUN, CREATININE, CALCIUM, MG, PHOS in the last 168 hours. GFR: CrCl cannot be calculated (Patient's most recent lab result is older than the maximum 21 days allowed.). Liver Function Tests: No results for input(s): AST, ALT, ALKPHOS, BILITOT, PROT, ALBUMIN in the last 168 hours. No results for input(s): LIPASE, AMYLASE in the last 168 hours. No results for input(s): AMMONIA in the last 168 hours. Coagulation Profile: No results for input(s): INR, PROTIME in the last 168 hours. Cardiac Enzymes: No results for input(s): CKTOTAL, CKMB, CKMBINDEX, TROPONINI in the last 168 hours. BNP (last 3 results) No results for input(s): PROBNP in the last 8760 hours. HbA1C: No results for input(s): HGBA1C in the last 72 hours. CBG: No results for input(s): GLUCAP in the last 168 hours. Lipid Profile: No results for input(s): CHOL, HDL, LDLCALC, TRIG, CHOLHDL, LDLDIRECT in the last 72 hours. Thyroid Function Tests: No results for input(s): TSH, T4TOTAL, FREET4, T3FREE, THYROIDAB in the last 72 hours. Anemia Panel: No results for input(s): VITAMINB12, FOLATE, FERRITIN, TIBC, IRON, RETICCTPCT in the last 72 hours. Urine analysis: No  results found for: COLORURINE, APPEARANCEUR, LABSPEC, PHURINE, GLUCOSEU, HGBUR, BILIRUBINUR, KETONESUR, PROTEINUR, UROBILINOGEN, NITRITE, LEUKOCYTESUR  Radiological Exams on Admission: No results found.  EKG: Independently reviewed.  Assessment/Plan Principal Problem:   Dialysis-associated peritonitis (HCC) Active Problems:   End stage kidney disease (HCC)    1. PD associated peritonitis - 1. Zosyn / Vanc empirically, already got vanc today with dialysis 2. Repeating cell count and cultures of peritoneal fluid 3. CBC and CMP pending 4. Spoke with Dr. : he will get patient on OR schedule for tomorrow for PD cath removal which is presumably the source of recurrent infections. 2. ESRD - 1. Lab work pending 2. But just had HD earlier today 3. Will hold lasix while NPO for surgery 3. Acute on chronic pain - 1. Continue home long acting oxycontin Q12H 2. Will use PRN dilaudid for breakthrough pain as needed, hold percocet 3. Will put patient on continuous pulse ox  DVT prophylaxis:  SCDs only due to planned OR in AM Code Status: Full Family Communication: No family in room Disposition Plan: Home after admit Consults called: General surgery Admission status: Place in Obs   Hillary Bow. DO [facility] Pager [phone]  If 7AM-7PM, please contact day team taking care of patient [email or web address] Password [email or web address]  08/08/2016, 8:27 PM

## 2016-08-08 NOTE — Progress Notes (Signed)
Pharmacy Antibiotic Note  James Kim is a 41 y.o. male admitted on 08/08/2016 for removal of PD catheter.  Pharmacy has been consulted for vancomycin and Zosyn dosing for peritonitis.    Patient reports that he has been on PD for ~3 years and battles peritonitis since January.  Today is his first hemodialysis session and it lasted for 3 hours.   He received vancomycin 1500mg  IV PTA.   Plan: - No additional vanc today (received 1500mg  IV PTA).  F/U HD schedule for further dosing. - Zosyn 3.375gm IV Q12H, 4 hr infusion - Monitor HD schedule/tolerance, clinical progress, vanc trough as indicated   Height: 5\' 6"  (167.6 cm) Weight: 188 lb 6.4 oz (85.5 kg) IBW/kg (Calculated) : 63.8   Temp (24hrs), Avg:99.9 F (37.7 C), Min:99.9 F (37.7 C), Max:99.9 F (37.7 C)  No results for input(s): WBC, CREATININE, LATICACIDVEN, VANCOTROUGH, VANCOPEAK, VANCORANDOM, GENTTROUGH, GENTPEAK, GENTRANDOM, TOBRATROUGH, TOBRAPEAK, TOBRARND, AMIKACINPEAK, AMIKACINTROU, AMIKACIN in the last 168 hours.  CrCl cannot be calculated (Patient's most recent lab result is older than the maximum 21 days allowed.).    Allergies  Allergen Reactions  . Sulfa Antibiotics Anaphylaxis, Itching and Rash    Vanc 4/3 >> Zosyn 4/3 >>    Chee Kinslow D. 6/3, PharmD, BCPS Pager:  [phone] 08/08/2016, 8:56 PM

## 2016-08-08 NOTE — Telephone Encounter (Signed)
Return James Kim call, he is scheduled for a hemodialysis treatment today, he had a perm-cath placed on 08/03/2016.  He has had two bouts of Peritonitis, he's being scheduled for PD Cath removal at [facility] he states. He doesn't have a date at this time.  He admits he has been experiencing increase intensity of pain and has been using his Percocet more frequently. Educated on the narcotic policy and not to self escalate his medications. This can lead to discharge her verbalizes understanding.  We will increase his Oxycodone 10/325 5 times a day as needed, this month and re-evaluate

## 2016-08-08 NOTE — Consult Note (Signed)
**Note James-Identified via Obfuscation** Reason for Consult: infected PD catheter Referring Physician: [name] is an 41 y.o. male.  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 He started dialysis in 2015 with a peritoneal dialysis catheter and then after 2 years in began having recurrent infections with peritonitis. He is now transitioning to hemodialysis and presents for catheter removal. He has abdominal pain currently especially with movement and pressure.  Past Medical History:  Diagnosis Date  . Anxiety   . Arthritis    "from my knees down" (08/08/2016)  . Childhood asthma   . 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 Dialysis-associated peritonitis (HCC) 08/08/2016  . ESRD on peritoneal dialysis (HCC)    "01/29/2014-08/07/2016" (08/08/2016)  . GERD (gastroesophageal reflux disease)   . Heel spur    "both feet" (08/08/2016)  . Hypertension   . OSA on CPAP   . Pain management    "for pain BLE; knees down thru feet" (08/08/2016)  . Peritoneal dialysis catheter in place [facility])     Past Surgical History:  Procedure Laterality Date  . DIALYSIS/PERMA CATHETER INSERTION Right 08/03/2016  . INSERTION OF DIALYSIS CATHETER  ~ 2015   peritoneal dialysis    Family History  Problem Relation Age of Onset  . Stroke Mother   . Diabetes Father     Social History:  reports that he has been smoking.  He has smoked for the past 22.00 years. His smokeless tobacco use includes Snuff. He reports that he does not drink alcohol or use drugs.  Allergies:  Allergies  Allergen Reactions  . Sulfa Antibiotics Anaphylaxis, Itching and Rash    Medications: I have reviewed the patient's current medications.  No results found for this or any previous visit (from the past 48 hour(s)).  No results found.  Review of Systems  Constitutional: Negative for chills and fever.  HENT: Negative for hearing loss.   Eyes: Negative for blurred vision and  double vision.  Respiratory: Negative for cough and hemoptysis.   Cardiovascular: Negative for chest pain and palpitations.  Gastrointestinal: Positive for abdominal pain. Negative for nausea and vomiting.  Genitourinary: Negative for dysuria and urgency.  Musculoskeletal: Negative for myalgias and neck pain.  Skin: Negative for itching and rash.  Neurological: Negative for dizziness, tingling and headaches.  Endo/Heme/Allergies: Does not bruise/bleed easily.  Psychiatric/Behavioral: Negative for depression and suicidal ideas.   Blood pressure 127/78, pulse (!) 104, temperature 99.9 F (37.7 C), temperature source Oral, resp. rate 20, height 5\' 6"  (1.676 m), weight 85.5 kg (188 lb 6.4 oz), SpO2 97 %. Physical Exam  Vitals reviewed. Constitutional: He is oriented to person, place, and time. He appears well-developed and well-nourished.  HENT:  Head: Normocephalic and atraumatic.  Eyes: Conjunctivae and EOM are normal. Pupils are equal, round, and reactive to light.  Neck: Normal range of motion. Neck supple.  Cardiovascular: Normal rate and regular rhythm.   Respiratory: Effort normal and breath sounds normal.  GI: Soft. Bowel sounds are normal. He exhibits no distension. There is tenderness.  Catheter in right lower quadrant with right periumbilical scar  Musculoskeletal: Normal range of motion.  Neurological: He is alert and oriented to person, place, and time.  Skin: Skin is warm and dry.  Psychiatric: He has a normal mood and affect. His behavior is normal.      Assessment/Plan: 41 yo male with ESRD and infected peritoneal dialysis catheter -  plan for peritoneal dialysis catheter removal during hospitalization  James Kim 08/08/2016, 9:15 PM

## 2016-08-09 DIAGNOSIS — T8571XA Infection and inflammatory reaction due to peritoneal dialysis catheter, initial encounter: Secondary | ICD-10-CM | POA: Diagnosis present

## 2016-08-09 DIAGNOSIS — M109 Gout, unspecified: Secondary | ICD-10-CM | POA: Diagnosis present

## 2016-08-09 DIAGNOSIS — Z992 Dependence on renal dialysis: Secondary | ICD-10-CM | POA: Diagnosis not present

## 2016-08-09 DIAGNOSIS — K659 Peritonitis, unspecified: Secondary | ICD-10-CM | POA: Diagnosis not present

## 2016-08-09 DIAGNOSIS — I120 Hypertensive chronic kidney disease with stage 5 chronic kidney disease or end stage renal disease: Secondary | ICD-10-CM | POA: Diagnosis present

## 2016-08-09 DIAGNOSIS — Z79899 Other long term (current) drug therapy: Secondary | ICD-10-CM | POA: Diagnosis not present

## 2016-08-09 DIAGNOSIS — F419 Anxiety disorder, unspecified: Secondary | ICD-10-CM | POA: Diagnosis not present

## 2016-08-09 DIAGNOSIS — Z833 Family history of diabetes mellitus: Secondary | ICD-10-CM | POA: Diagnosis not present

## 2016-08-09 DIAGNOSIS — E876 Hypokalemia: Secondary | ICD-10-CM

## 2016-08-09 DIAGNOSIS — T8571XD Infection and inflammatory reaction due to peritoneal dialysis catheter, subsequent encounter: Secondary | ICD-10-CM | POA: Diagnosis not present

## 2016-08-09 DIAGNOSIS — K652 Spontaneous bacterial peritonitis: Secondary | ICD-10-CM | POA: Diagnosis not present

## 2016-08-09 DIAGNOSIS — R161 Splenomegaly, not elsewhere classified: Secondary | ICD-10-CM | POA: Diagnosis not present

## 2016-08-09 DIAGNOSIS — D631 Anemia in chronic kidney disease: Secondary | ICD-10-CM | POA: Diagnosis present

## 2016-08-09 DIAGNOSIS — Z881 Allergy status to other antibiotic agents status: Secondary | ICD-10-CM | POA: Diagnosis not present

## 2016-08-09 DIAGNOSIS — J45909 Unspecified asthma, uncomplicated: Secondary | ICD-10-CM | POA: Diagnosis not present

## 2016-08-09 DIAGNOSIS — E785 Hyperlipidemia, unspecified: Secondary | ICD-10-CM | POA: Diagnosis not present

## 2016-08-09 DIAGNOSIS — R188 Other ascites: Secondary | ICD-10-CM | POA: Diagnosis not present

## 2016-08-09 DIAGNOSIS — M7731 Calcaneal spur, right foot: Secondary | ICD-10-CM | POA: Diagnosis not present

## 2016-08-09 DIAGNOSIS — K219 Gastro-esophageal reflux disease without esophagitis: Secondary | ICD-10-CM | POA: Diagnosis not present

## 2016-08-09 DIAGNOSIS — M7732 Calcaneal spur, left foot: Secondary | ICD-10-CM | POA: Diagnosis not present

## 2016-08-09 DIAGNOSIS — G4733 Obstructive sleep apnea (adult) (pediatric): Secondary | ICD-10-CM | POA: Diagnosis present

## 2016-08-09 DIAGNOSIS — B9689 Other specified bacterial agents as the cause of diseases classified elsewhere: Secondary | ICD-10-CM | POA: Diagnosis not present

## 2016-08-09 DIAGNOSIS — R1084 Generalized abdominal pain: Secondary | ICD-10-CM | POA: Diagnosis not present

## 2016-08-09 DIAGNOSIS — N186 End stage renal disease: Secondary | ICD-10-CM | POA: Diagnosis not present

## 2016-08-09 DIAGNOSIS — K5732 Diverticulitis of large intestine without perforation or abscess without bleeding: Secondary | ICD-10-CM | POA: Diagnosis not present

## 2016-08-09 DIAGNOSIS — G8929 Other chronic pain: Secondary | ICD-10-CM | POA: Diagnosis not present

## 2016-08-09 DIAGNOSIS — N2581 Secondary hyperparathyroidism of renal origin: Secondary | ICD-10-CM | POA: Diagnosis not present

## 2016-08-09 DIAGNOSIS — E8889 Other specified metabolic disorders: Secondary | ICD-10-CM | POA: Diagnosis present

## 2016-08-09 DIAGNOSIS — Y838 Other surgical procedures as the cause of abnormal reaction of the patient, or of later complication, without mention of misadventure at the time of the procedure: Secondary | ICD-10-CM | POA: Diagnosis present

## 2016-08-09 DIAGNOSIS — M069 Rheumatoid arthritis, unspecified: Secondary | ICD-10-CM | POA: Diagnosis present

## 2016-08-09 DIAGNOSIS — F17200 Nicotine dependence, unspecified, uncomplicated: Secondary | ICD-10-CM | POA: Diagnosis present

## 2016-08-09 DIAGNOSIS — E1122 Type 2 diabetes mellitus with diabetic chronic kidney disease: Secondary | ICD-10-CM | POA: Diagnosis present

## 2016-08-09 LAB — SURGICAL PCR SCREEN
MRSA, PCR: NEGATIVE
STAPHYLOCOCCUS AUREUS: NEGATIVE

## 2016-08-09 LAB — GRAM STAIN

## 2016-08-09 LAB — BODY FLUID CELL COUNT WITH DIFFERENTIAL
Lymphs, Fluid: 12 %
MONOCYTE-MACROPHAGE-SEROUS FLUID: 15 % — AB (ref 50–90)
NEUTROPHIL FLUID: 73 % — AB (ref 0–25)
WBC FLUID: 4367 uL — AB (ref 0–1000)

## 2016-08-09 LAB — PATHOLOGIST SMEAR REVIEW

## 2016-08-09 LAB — HIV ANTIBODY (ROUTINE TESTING W REFLEX): HIV SCREEN 4TH GENERATION: NONREACTIVE

## 2016-08-09 MED ORDER — DEXTROSE 5 % IV SOLN
2.0000 g | INTRAVENOUS | Status: AC
  Administered 2016-08-10: 2 g via INTRAVENOUS
  Filled 2016-08-09 (×2): qty 2

## 2016-08-09 MED ORDER — POTASSIUM CHLORIDE CRYS ER 20 MEQ PO TBCR
20.0000 meq | EXTENDED_RELEASE_TABLET | Freq: Once | ORAL | Status: AC
Start: 2016-08-09 — End: 2016-08-09
  Administered 2016-08-09: 20 meq via ORAL
  Filled 2016-08-09: qty 1

## 2016-08-09 MED ORDER — CHLORHEXIDINE GLUCONATE CLOTH 2 % EX PADS
6.0000 | MEDICATED_PAD | Freq: Once | CUTANEOUS | Status: AC
  Administered 2016-08-10: 6 via TOPICAL

## 2016-08-09 MED ORDER — CHLORHEXIDINE GLUCONATE CLOTH 2 % EX PADS
6.0000 | MEDICATED_PAD | Freq: Once | CUTANEOUS | Status: AC
  Administered 2016-08-09: 6 via TOPICAL

## 2016-08-09 NOTE — Progress Notes (Signed)
PROGRESS NOTE  James Kim  HDQ:[id number] DOB: 06-21-1975 DOA: 08/08/2016 PCP: Beatriz Stallion, MD  Brief Narrative:  James Kim is a 41 y.o. male with medical history significant of ESRD, patient is on chronic PD.  He unfortunately has had 3 bouts of recurrent bacterial peritonitis over the past couple of months attributed to a small undetected crack in the external part of his HD catheter.  He has been transitioned to HD starting 4/3.  Surgery is planning to remove his PD catheter on 4/5.  Body fluid culture is pending but had numerous WBC consistent with infection.    Assessment & Plan:   Principal Problem:   Dialysis-associated peritonitis (HCC) Active Problems:   End stage kidney disease (HCC)  PD associated peritonitis -  Continue vancomycin and zosyn pending culture data -  Peritoneal fluid culture pending -  Last PD culture grew corynebacterium -  NPO at [facility] for PD catheter removal tomorrow  ESRD -  Started HD  Acute on chronic pain -  Continue oxycontin q12h with dilaudid for breakthrough pain  DVT prophylaxis:  SCDs pending OR Code Status:  Full  Family Communication:  pateint alone Disposition Plan:  Home once catheter removed   Consultants:   Nephrology  General surgery  Procedures:  hemodialysis  Antimicrobials:  Anti-infectives    Start     Dose/Rate Route Frequency Ordered Stop   08/10/16 0930  cefoTEtan (CEFOTAN) 2 g in dextrose 5 % 50 mL IVPB     2 g 100 mL/hr over 30 Minutes Intravenous To [facility] 08/09/16 1356 08/11/16 0930   08/08/16 2200  piperacillin-tazobactam (ZOSYN) IVPB 3.375 g     3.375 g 12.5 mL/hr over 240 Minutes Intravenous Every 12 hours 08/08/16 2057         Subjective: Abdominal pain is somewhat better but persists.  Able to eat and drink some.  Denies fevers and chills.    Objective: Vitals:   08/08/16 2113 08/09/16 0520 08/09/16 0954 08/09/16 1708  BP: 126/63 131/64 129/80 121/67  Pulse: 94 95  81 82  Resp: 18 18 17 17   Temp: 99.7 F (37.6 C) 100 F (37.8 C) 98.5 F (36.9 C) 99.2 F (37.3 C)  TempSrc: Oral Oral Oral Oral  SpO2: 100% 99% 100% 94%  Weight: 85.5 kg (188 lb 6.4 oz)     Height: 5\' 6"  (1.676 m)       Intake/Output Summary (Last 24 hours) at 08/09/16 1730 Last data filed at 08/09/16 1428  Gross per 24 hour  Intake              600 ml  Output              150 ml  Net              450 ml   Filed Weights   08/08/16 1900 08/08/16 2113  Weight: 85.5 kg (188 lb 6.4 oz) 85.5 kg (188 lb 6.4 oz)    Examination:  General exam:  Adult male.  No acute distress.  HEENT:  NCAT, MMM Respiratory system: Clear to auscultation bilaterally Cardiovascular system: Regular rate and rhythm, normal S1/S2. No murmurs, rubs, gallops or clicks.  Warm extremities Gastrointestinal system: Normal active bowel sounds, soft, mildly distended.  PD catheter site nonerythematous, no induration, no surrounding purulence.  Diffuse abdominal tenderness with some guarding MSK:  Normal tone and bulk, no lower extremity edema Neuro:  Grossly intact    Data Reviewed: I have personally  reviewed following labs and imaging studies  CBC:  Recent Labs Lab 08/08/16 2122  WBC 7.8  NEUTROABS 6.1  HGB 9.8*  HCT 29.2*  MCV 84.9  PLT 200   Basic Metabolic Panel:  Recent Labs Lab 08/08/16 2122  NA 139  K 3.0*  CL 100*  CO2 29  GLUCOSE 107*  BUN 12  CREATININE 5.65*  CALCIUM 7.9*   GFR: Estimated Creatinine Clearance: 18.2 mL/min (A) (by C-G formula based on SCr of 5.65 mg/dL (H)). Liver Function Tests:  Recent Labs Lab 08/08/16 2122  AST 14*  ALT 11*  ALKPHOS 53  BILITOT 0.4  PROT 5.3*  ALBUMIN 1.9*   No results for input(s): LIPASE, AMYLASE in the last 168 hours. No results for input(s): AMMONIA in the last 168 hours. Coagulation Profile: No results for input(s): INR, PROTIME in the last 168 hours. Cardiac Enzymes: No results for input(s): CKTOTAL, CKMB, CKMBINDEX,  TROPONINI in the last 168 hours. BNP (last 3 results) No results for input(s): PROBNP in the last 8760 hours. HbA1C: No results for input(s): HGBA1C in the last 72 hours. CBG: No results for input(s): GLUCAP in the last 168 hours. Lipid Profile: No results for input(s): CHOL, HDL, LDLCALC, TRIG, CHOLHDL, LDLDIRECT in the last 72 hours. Thyroid Function Tests: No results for input(s): TSH, T4TOTAL, FREET4, T3FREE, THYROIDAB in the last 72 hours. Anemia Panel: No results for input(s): VITAMINB12, FOLATE, FERRITIN, TIBC, IRON, RETICCTPCT in the last 72 hours. Urine analysis: No results found for: COLORURINE, APPEARANCEUR, LABSPEC, PHURINE, GLUCOSEU, HGBUR, BILIRUBINUR, KETONESUR, PROTEINUR, UROBILINOGEN, NITRITE, LEUKOCYTESUR Sepsis Labs: @LABRCNTIP (procalcitonin:4,lacticidven:4)  ) Recent Results (from the past 240 hour(s))  Stat Gram stain     Status: None   Collection Time: 08/09/16  4:14 AM  Result Value Ref Range Status   Specimen Description PERITONEAL  Final   Special Requests NONE  Final   Gram Stain   Final    MODERATE WBC PRESENT,BOTH PMN AND MONONUCLEAR NO ORGANISMS SEEN    Report Status 08/09/2016 FINAL  Final  Surgical pcr screen     Status: None   Collection Time: 08/09/16  8:31 AM  Result Value Ref Range Status   MRSA, PCR NEGATIVE NEGATIVE Final   Staphylococcus aureus NEGATIVE NEGATIVE Final    Comment:        The Xpert SA Assay (FDA approved for NASAL specimens in patients over 56 years of age), is one component of a comprehensive surveillance program.  Test performance has been validated by [facility] for patients greater than or equal to 73 year old. It is not intended to diagnose infection nor to guide or monitor treatment.       Radiology Studies: No results found.   Scheduled Meds: . amLODipine  10 mg Oral Daily  . [START ON 08/10/2016] cefoTEtan (CEFOTAN) IV  2 g Intravenous To SS-Surg  . Chlorhexidine Gluconate Cloth  6 each Topical Once     And  . Chlorhexidine Gluconate Cloth  6 each Topical Once  . escitalopram  10 mg Oral QHS  . feeding supplement (NEPRO CARB STEADY)  237 mL Oral BID BM  . metoprolol  100 mg Oral BID  . oxyCODONE  20 mg Oral Q12H  . pantoprazole  40 mg Oral QAC breakfast  . piperacillin-tazobactam (ZOSYN)  IV  3.375 g Intravenous Q12H  . Vitamin D (Ergocalciferol)  50,000 Units Oral Q Wed   Continuous Infusions:   LOS: 0 days    Time spent: 30 min  Renae Fickle, MD [facility] Pager [phone]  If 7PM-7AM, please contact night-coverage [email or web address] Password [email or web address] 08/09/2016, 5:30 PM

## 2016-08-09 NOTE — Progress Notes (Signed)
Patient ID: James Kim, male   DOB: 03/11/1976, 41 y.o.   MRN: [id number]  [facility] Surgery Progress Note     Subjective: CC- Infected PD catheter Feeling slightly better since admission. States that pain medication is helping his abdominal pain. Pain is global about the abdomen and worse with palpation and movement. Denies n/v.   Objective: Vital signs in last 24 hours: Temp:  [99.7 F (37.6 C)-100 F (37.8 C)] 100 F (37.8 C) (04/04 0520) Pulse Rate:  [94-104] 95 (04/04 0520) Resp:  [18-20] 18 (04/04 0520) BP: (126-131)/(63-78) 131/64 (04/04 0520) SpO2:  [97 %-100 %] 99 % (04/04 0520) Weight:  [188 lb 6.4 oz (85.5 kg)] 188 lb 6.4 oz (85.5 kg) (04/03 2113) Last BM Date: 08/07/16  Intake/Output from previous day: 04/03 0701 - 04/04 0700 In: -  Out: 150 [Urine:150] Intake/Output this shift: No intake/output data recorded.  PE: Gen:  Alert and oriented x3, NAD, pleasant Card:  RRR, no M/G/R heard Head: normal neck ROM, neck supple, good dentition Pulm:  CTAB, no W/R/R, effort normal Abd: Soft, ND, +BS, no HSM, no hernia, catheter in RLQ with no drainage or surrounding erythema, TTP most significantly in the lower quadrants, no rebound or guarding Ext:  No erythema, edema, or tenderness BUE/BLE  Lab Results:   Recent Labs  08/08/16 2122  WBC 7.8  HGB 9.8*  HCT 29.2*  PLT 200   BMET  Recent Labs  08/08/16 2122  NA 139  K 3.0*  CL 100*  CO2 29  GLUCOSE 107*  BUN 12  CREATININE 5.65*  CALCIUM 7.9*   PT/INR No results for input(s): LABPROT, INR in the last 72 hours. CMP     Component Value Date/Time   NA 139 08/08/2016 2122   K 3.0 (L) 08/08/2016 2122   CL 100 (L) 08/08/2016 2122   CO2 29 08/08/2016 2122   GLUCOSE 107 (H) 08/08/2016 2122   BUN 12 08/08/2016 2122   CREATININE 5.65 (H) 08/08/2016 2122   CREATININE 2.82 (H) 04/11/2013 1201   CALCIUM 7.9 (L) 08/08/2016 2122   PROT 5.3 (L) 08/08/2016 2122   ALBUMIN 1.9 (L) 08/08/2016 2122    AST 14 (L) 08/08/2016 2122   ALT 11 (L) 08/08/2016 2122   ALKPHOS 53 08/08/2016 2122   BILITOT 0.4 08/08/2016 2122   GFRNONAA 12 (L) 08/08/2016 2122   GFRAA 13 (L) 08/08/2016 2122   Lipase  No results found for: LIPASE     Studies/Results: No results found.  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 Ok for diet today, NPO after midnight. Continue IV antibiotics.   LOS: 0 days    , [facility] Surgery 08/09/2016, 8:02 AM Pager: [phone] Consults: [phone] Mon-Fri 7:00 am-4:30 pm Sat-Sun 7:00 am-11:30 am

## 2016-08-09 NOTE — Progress Notes (Addendum)
Pt reports that he wears a CPAP at night for OSA. Page to Donnamarie Poag for notification. New order placed per request. Dondra Spry, RN

## 2016-08-09 NOTE — Progress Notes (Signed)
Initial Nutrition Assessment  DOCUMENTATION CODES:   Obesity unspecified  INTERVENTION:  Continue Nepro Shake po BID, each supplement provides 425 kcal and 19 grams protein.  Encourage adequate PO intake.   NUTRITION DIAGNOSIS:   Increased nutrient needs related to chronic illness as evidenced by estimated needs.  GOAL:   Patient will meet greater than or equal to 90% of their needs  MONITOR:   PO intake, Supplement acceptance, Labs, Weight trends, Skin, I & O's  REASON FOR ASSESSMENT:   Malnutrition Screening Tool    ASSESSMENT:   41 y.o. male with medical history significant of ESRD, patient is on chronic PD.  He unfortunately has had 3 bouts of recurrent bacterial peritonitis over the past couple of months.  He has been transitioned to HD starting 4/3.  He is being sent in to the hospital with plans to remove the PD catheter by surgery tomorrow as this is felt to be infected and likely the source of his recurrent peritonitis.  Pt reports appetite has been improving. Meal completion has been 40-100%. Pt reports some nausea at meals on occasion, thus need to take frequent pauses when eating to ensure proper digestion. Pt reports usually consuming 3 meals a day with snacks in between. Pt does consume Nepro shake at least once daily on Monday, Wednesday, and Fridays. Pt reports weight has been mostly stable with no significant weight loss he has noticed. Per weight records, pt with a 11% weight loss in 1 month. Question accuracy of recent recorded weight? RD to continue to monitor weight trends. Pt currently has Nepro shake ordered and has been consuming them. RD to continue with current orders.   Nutrition-Focused physical exam completed. Findings are no fat depletion, mild muscle depletion, and mild edema.   Labs and medications reviewed.   Diet Order:  Diet renal/carb modified with fluid restriction Diet-HS Snack? Nothing; Room service appropriate? Yes; Fluid consistency:  Thin; Fluid restriction: 1200 mL Fluid Diet NPO time specified  Skin:  Reviewed, no issues  Last BM:  4/2  Height:   Ht Readings from Last 1 Encounters:  08/08/16 5\' 6"  (1.676 m)    Weight:   Wt Readings from Last 1 Encounters:  08/08/16 188 lb 6.4 oz (85.5 kg)    Ideal Body Weight:  64.5 kg  BMI:  Body mass index is 30.41 kg/m.  Estimated Nutritional Needs:   Kcal:  2050-2250  Protein:  105-115 grams  Fluid:  1.2 L/day  EDUCATION NEEDS:   No education needs identified at this time  12-08-1976, MS, RD, LDN Pager # [phone] After hours/ weekend pager # [phone]

## 2016-08-10 ENCOUNTER — Encounter (HOSPITAL_COMMUNITY): Payer: Self-pay | Admitting: Certified Registered Nurse Anesthetist

## 2016-08-10 ENCOUNTER — Encounter (HOSPITAL_COMMUNITY)
Admission: AD | Disposition: A | Discharge status: Home / Self Care | Payer: Self-pay | Source: Ambulatory Visit | Attending: Internal Medicine

## 2016-08-10 ENCOUNTER — Inpatient Hospital Stay (HOSPITAL_COMMUNITY): Payer: Medicare Other

## 2016-08-10 ENCOUNTER — Inpatient Hospital Stay (HOSPITAL_COMMUNITY): Payer: Medicare Other | Admitting: Certified Registered Nurse Anesthetist

## 2016-08-10 DIAGNOSIS — K659 Peritonitis, unspecified: Secondary | ICD-10-CM

## 2016-08-10 DIAGNOSIS — R1084 Generalized abdominal pain: Secondary | ICD-10-CM

## 2016-08-10 DIAGNOSIS — T8571XA Infection and inflammatory reaction due to peritoneal dialysis catheter, initial encounter: Secondary | ICD-10-CM | POA: Diagnosis not present

## 2016-08-10 DIAGNOSIS — N186 End stage renal disease: Secondary | ICD-10-CM | POA: Diagnosis not present

## 2016-08-10 DIAGNOSIS — K652 Spontaneous bacterial peritonitis: Secondary | ICD-10-CM | POA: Diagnosis present

## 2016-08-10 HISTORY — PX: CAPD REMOVAL: SHX5234

## 2016-08-10 LAB — CBC
HEMATOCRIT: 29.6 % — AB (ref 39.0–52.0)
Hemoglobin: 9.7 g/dL — ABNORMAL LOW (ref 13.0–17.0)
MCH: 28.3 pg (ref 26.0–34.0)
MCHC: 32.8 g/dL (ref 30.0–36.0)
MCV: 86.3 fL (ref 78.0–100.0)
Platelets: 170 10*3/uL (ref 150–400)
RBC: 3.43 MIL/uL — ABNORMAL LOW (ref 4.22–5.81)
RDW: 14.1 % (ref 11.5–15.5)
WBC: 6.5 10*3/uL (ref 4.0–10.5)

## 2016-08-10 LAB — CREATININE, SERUM
CREATININE: 9.04 mg/dL — AB (ref 0.61–1.24)
GFR calc Af Amer: 8 mL/min — ABNORMAL LOW (ref >60)
GFR, EST NON AFRICAN AMERICAN: 7 mL/min — AB (ref >60)

## 2016-08-10 SURGERY — CONTINUOUS AMBULATORY PERITONEAL DIALYSIS  (CAPD) CATHETER REMOVAL
Anesthesia: General | Site: Abdomen

## 2016-08-10 MED ORDER — MULTIVITAMIN PO LIQD: Freq: Every day | ORAL | Status: DC

## 2016-08-10 MED ORDER — FLUCONAZOLE 100 MG PO TABS: 100.0000 mg | ORAL_TABLET | Freq: Every day | ORAL | Status: DC

## 2016-08-10 MED ORDER — MIDAZOLAM HCL 2 MG/2ML IJ SOLN
INTRAMUSCULAR | Status: AC
  Filled 2016-08-10: qty 2

## 2016-08-10 MED ORDER — LIDOCAINE HCL (CARDIAC) 20 MG/ML IV SOLN
INTRAVENOUS | Status: DC | PRN
  Administered 2016-08-10: 40 mg via INTRAVENOUS

## 2016-08-10 MED ORDER — SODIUM CHLORIDE 0.9 % IV SOLN
INTRAVENOUS | Status: DC
  Administered 2016-08-10 (×2): via INTRAVENOUS

## 2016-08-10 MED ORDER — HYDROMORPHONE HCL 1 MG/ML IJ SOLN
0.2500 mg | INTRAMUSCULAR | Status: DC | PRN
  Administered 2016-08-10 (×3): 0.5 mg via INTRAVENOUS

## 2016-08-10 MED ORDER — PROPOFOL 10 MG/ML IV BOLUS
INTRAVENOUS | Status: DC | PRN
  Administered 2016-08-10: 30 mg via INTRAVENOUS

## 2016-08-10 MED ORDER — PROPOFOL 10 MG/ML IV BOLUS
INTRAVENOUS | Status: AC
  Filled 2016-08-10: qty 20

## 2016-08-10 MED ORDER — BUPIVACAINE HCL (PF) 0.25 % IJ SOLN
INTRAMUSCULAR | Status: DC | PRN
  Administered 2016-08-10: 4 mL

## 2016-08-10 MED ORDER — SEVELAMER CARBONATE 800 MG PO TABS
1600.0000 mg | ORAL_TABLET | Freq: Three times a day (TID) | ORAL | Status: DC
  Administered 2016-08-10 – 2016-08-11 (×3): 1600 mg via ORAL
  Filled 2016-08-10 (×3): qty 2

## 2016-08-10 MED ORDER — ACETAMINOPHEN 500 MG PO TABS
ORAL_TABLET | ORAL | Status: AC
  Filled 2016-08-10: qty 2

## 2016-08-10 MED ORDER — HYDROMORPHONE HCL 1 MG/ML IJ SOLN
INTRAMUSCULAR | Status: AC
  Administered 2016-08-10: 0.5 mg via INTRAVENOUS
  Filled 2016-08-10: qty 1

## 2016-08-10 MED ORDER — SUCCINYLCHOLINE CHLORIDE 20 MG/ML IJ SOLN
INTRAMUSCULAR | Status: DC | PRN
  Administered 2016-08-10: 120 mg via INTRAVENOUS

## 2016-08-10 MED ORDER — ONDANSETRON HCL 4 MG/2ML IJ SOLN
INTRAMUSCULAR | Status: DC | PRN
  Administered 2016-08-10: 4 mg via INTRAVENOUS

## 2016-08-10 MED ORDER — MIDAZOLAM HCL 5 MG/5ML IJ SOLN
INTRAMUSCULAR | Status: DC | PRN
  Administered 2016-08-10: 2 mg via INTRAVENOUS

## 2016-08-10 MED ORDER — LACTATED RINGERS IV SOLN: INTRAVENOUS | Status: DC

## 2016-08-10 MED ORDER — FENTANYL CITRATE (PF) 100 MCG/2ML IJ SOLN
INTRAMUSCULAR | Status: DC | PRN
  Administered 2016-08-10: 50 ug via INTRAVENOUS
  Administered 2016-08-10: 100 ug via INTRAVENOUS

## 2016-08-10 MED ORDER — PROMETHAZINE HCL 25 MG/ML IJ SOLN: 6.2500 mg | INTRAMUSCULAR | Status: DC | PRN

## 2016-08-10 MED ORDER — OXYCODONE HCL 5 MG PO TABS
10.0000 mg | ORAL_TABLET | Freq: Once | ORAL | Status: AC
  Administered 2016-08-10: 10 mg via ORAL

## 2016-08-10 MED ORDER — IOPAMIDOL (ISOVUE-300) INJECTION 61%
100.0000 mL | Freq: Once | INTRAVENOUS | Status: AC | PRN
  Administered 2016-08-10: 100 mL via INTRAVENOUS

## 2016-08-10 MED ORDER — OXYCODONE HCL 5 MG PO TABS
ORAL_TABLET | ORAL | Status: AC
  Filled 2016-08-10: qty 2

## 2016-08-10 MED ORDER — MEPERIDINE HCL 25 MG/ML IJ SOLN: 6.2500 mg | INTRAMUSCULAR | Status: DC | PRN

## 2016-08-10 MED ORDER — IOPAMIDOL (ISOVUE-300) INJECTION 61%
INTRAVENOUS | Status: AC
  Administered 2016-08-10: 30 mL
  Filled 2016-08-10: qty 100

## 2016-08-10 MED ORDER — ADULT MULTIVITAMIN W/MINERALS CH
1.0000 | ORAL_TABLET | Freq: Every day | ORAL | Status: DC
  Administered 2016-08-11: 1 via ORAL
  Filled 2016-08-10: qty 1

## 2016-08-10 MED ORDER — EPHEDRINE SULFATE 50 MG/ML IJ SOLN
INTRAMUSCULAR | Status: DC | PRN
  Administered 2016-08-10: 10 mg via INTRAVENOUS

## 2016-08-10 MED ORDER — FENTANYL CITRATE (PF) 250 MCG/5ML IJ SOLN
INTRAMUSCULAR | Status: AC
  Filled 2016-08-10: qty 5

## 2016-08-10 MED ORDER — ALLOPURINOL 100 MG PO TABS
100.0000 mg | ORAL_TABLET | Freq: Every day | ORAL | Status: DC
  Administered 2016-08-11: 100 mg via ORAL
  Filled 2016-08-10 (×2): qty 1

## 2016-08-10 MED ORDER — 0.9 % SODIUM CHLORIDE (POUR BTL) OPTIME
TOPICAL | Status: DC | PRN
  Administered 2016-08-10: 1000 mL

## 2016-08-10 MED ORDER — ACETAMINOPHEN 500 MG PO TABS
1000.0000 mg | ORAL_TABLET | Freq: Once | ORAL | Status: AC
  Administered 2016-08-10: 1000 mg via ORAL

## 2016-08-10 MED ORDER — POTASSIUM CHLORIDE CRYS ER 20 MEQ PO TBCR
20.0000 meq | EXTENDED_RELEASE_TABLET | Freq: Every day | ORAL | Status: DC
  Administered 2016-08-10 – 2016-08-11 (×2): 20 meq via ORAL
  Filled 2016-08-10 (×2): qty 1

## 2016-08-10 MED ORDER — IOPAMIDOL (ISOVUE-300) INJECTION 61%
INTRAVENOUS | Status: AC
  Administered 2016-08-10: 30 mL
  Filled 2016-08-10: qty 30

## 2016-08-10 MED ORDER — HYDROMORPHONE HCL 1 MG/ML IJ SOLN
INTRAMUSCULAR | Status: AC
  Filled 2016-08-10: qty 0.5

## 2016-08-10 MED ORDER — ENOXAPARIN SODIUM 30 MG/0.3ML ~~LOC~~ SOLN
30.0000 mg | SUBCUTANEOUS | Status: DC
  Administered 2016-08-11: 30 mg via SUBCUTANEOUS
  Filled 2016-08-10: qty 0.3

## 2016-08-10 SURGICAL SUPPLY — 47 items
ADAPTER CATH SAFE LCK II CO PK (MISCELLANEOUS) ×1 IMPLANT
ADAPTER SAFE LOCK II CO PACK (MISCELLANEOUS) ×1
BENZOIN TINCTURE PRP APPL 2/3 (GAUZE/BANDAGES/DRESSINGS) ×2 IMPLANT
CANISTER SUCT 3000ML PPV (MISCELLANEOUS) IMPLANT
CATH MONCRIEF POPOVICH (CATHETERS) IMPLANT
CATH SWAN NECK TORON 7CM LT (CATHETERS) IMPLANT
CHLORAPREP W/TINT 26ML (MISCELLANEOUS) ×2 IMPLANT
COIL SWAN NECK LT (MISCELLANEOUS) IMPLANT
COIL SWAN NECK RT (MISCELLANEOUS) IMPLANT
COVER SURGICAL LIGHT HANDLE (MISCELLANEOUS) ×2 IMPLANT
DECANTER SPIKE VIAL GLASS SM (MISCELLANEOUS) ×2 IMPLANT
DRAPE LAPAROSCOPIC ABDOMINAL (DRAPES) ×2 IMPLANT
ELECT CAUTERY BLADE 6.4 (BLADE) ×2 IMPLANT
ELECT REM PT RETURN 9FT ADLT (ELECTROSURGICAL) ×2
ELECTRODE REM PT RTRN 9FT ADLT (ELECTROSURGICAL) ×1 IMPLANT
GAUZE SPONGE 4X4 12PLY STRL (GAUZE/BANDAGES/DRESSINGS) ×2 IMPLANT
GAUZE SPONGE 4X4 16PLY XRAY LF (GAUZE/BANDAGES/DRESSINGS) ×2 IMPLANT
GLOVE EUDERMIC 7 POWDERFREE (GLOVE) ×2 IMPLANT
GOWN STRL REUS W/ TWL XL LVL3 (GOWN DISPOSABLE) ×1 IMPLANT
GOWN STRL REUS W/TWL XL LVL3 (GOWN DISPOSABLE) ×1
KIT BASIN OR (CUSTOM PROCEDURE TRAY) ×2 IMPLANT
KIT ROOM TURNOVER OR (KITS) ×2 IMPLANT
NEEDLE HYPO 25GX1X1/2 BEV (NEEDLE) ×2 IMPLANT
NS IRRIG 1000ML POUR BTL (IV SOLUTION) ×2 IMPLANT
PACK SURGICAL SETUP 50X90 (CUSTOM PROCEDURE TRAY) ×2 IMPLANT
PAD ARMBOARD 7.5X6 YLW CONV (MISCELLANEOUS) ×2 IMPLANT
PENCIL BUTTON HOLSTER BLD 10FT (ELECTRODE) ×2 IMPLANT
SET EXTENSION TUBING 8  CATH (SET/KITS/TRAYS/PACK) ×2 IMPLANT
SET Y SINGLE USE DIALYSIS (HEMODIALYSIS SUPPLIES) IMPLANT
STRIP CLOSURE SKIN 1/2X4 (GAUZE/BANDAGES/DRESSINGS) ×2 IMPLANT
SUT CHROMIC 3 0 SH 27 (SUTURE) IMPLANT
SUT ETHILON 5 0 PS 2 18 (SUTURE) IMPLANT
SUT MNCRL AB 4-0 PS2 18 (SUTURE) ×2 IMPLANT
SUT NOVA NAB GS-21 0 18 T12 DT (SUTURE) ×2 IMPLANT
SUT PROLENE 2 0 CT2 30 (SUTURE) IMPLANT
SUT VIC AB 2-0 CT3 27 (SUTURE) IMPLANT
SUT VIC AB 3-0 SH 8-18 (SUTURE) ×2 IMPLANT
SUT VICRYL 0 UR6 27IN ABS (SUTURE) ×2 IMPLANT
SWAB COLLECTION DEVICE MRSA (MISCELLANEOUS) ×2 IMPLANT
SWAB CULTURE ESWAB REG 1ML (MISCELLANEOUS) ×2 IMPLANT
SYR 30ML LL (SYRINGE) ×2 IMPLANT
SYR BULB 3OZ (MISCELLANEOUS) IMPLANT
SYR CONTROL 10ML LL (SYRINGE) ×2 IMPLANT
TOWEL OR 17X24 6PK STRL BLUE (TOWEL DISPOSABLE) ×2 IMPLANT
TOWEL OR 17X26 10 PK STRL BLUE (TOWEL DISPOSABLE) ×2 IMPLANT
TUBE CONNECTING 12X1/4 (SUCTIONS) IMPLANT
YANKAUER SUCT BULB TIP NO VENT (SUCTIONS) IMPLANT

## 2016-08-10 NOTE — H&P (View-Only) (Signed)
Patient ID: James Kim, male   DOB: 02/16/1976, 41 y.o.   MRN: [id number]  [facility] Progress Note     Subjective: CC- Infected PD catheter Feeling slightly better since admission. States that pain medication is helping his abdominal pain. Pain is global about the abdomen and worse with palpation and movement. Denies n/v.   Objective: Vital signs in last 24 hours: Temp:  [99.7 F (37.6 C)-100 F (37.8 C)] 100 F (37.8 C) (04/04 0520) Pulse Rate:  [94-104] 95 (04/04 0520) Resp:  [18-20] 18 (04/04 0520) BP: (126-131)/(63-78) 131/64 (04/04 0520) SpO2:  [97 %-100 %] 99 % (04/04 0520) Weight:  [188 lb 6.4 oz (85.5 kg)] 188 lb 6.4 oz (85.5 kg) (04/03 2113) Last BM Date: 08/07/16  Intake/Output from previous day: 04/03 0701 - 04/04 0700 In: -  Out: 150 [Urine:150] Intake/Output this shift: No intake/output data recorded.  PE: Gen:  Alert and oriented x3, NAD, pleasant Card:  RRR, no M/G/R heard Head: normal neck ROM, neck supple, good dentition Pulm:  CTAB, no W/R/R, effort normal Abd: Soft, ND, +BS, no HSM, no hernia, catheter in RLQ with no drainage or surrounding erythema, TTP most significantly in the lower quadrants, no rebound or guarding Ext:  No erythema, edema, or tenderness BUE/BLE  Lab Results:   Recent Labs  08/08/16 2122  WBC 7.8  HGB 9.8*  HCT 29.2*  PLT 200   BMET  Recent Labs  08/08/16 2122  NA 139  K 3.0*  CL 100*  CO2 29  GLUCOSE 107*  BUN 12  CREATININE 5.65*  CALCIUM 7.9*   PT/INR No results for input(s): LABPROT, INR in the last 72 hours. CMP     Component Value Date/Time   NA 139 08/08/2016 2122   K 3.0 (L) 08/08/2016 2122   CL 100 (L) 08/08/2016 2122   CO2 29 08/08/2016 2122   GLUCOSE 107 (H) 08/08/2016 2122   BUN 12 08/08/2016 2122   CREATININE 5.65 (H) 08/08/2016 2122   CREATININE 2.82 (H) 04/11/2013 1201   CALCIUM 7.9 (L) 08/08/2016 2122   PROT 5.3 (L) 08/08/2016 2122   ALBUMIN 1.9 (L) 08/08/2016 2122    AST 14 (L) 08/08/2016 2122   ALT 11 (L) 08/08/2016 2122   ALKPHOS 53 08/08/2016 2122   BILITOT 0.4 08/08/2016 2122   GFRNONAA 12 (L) 08/08/2016 2122   GFRAA 13 (L) 08/08/2016 2122   Lipase  No results found for: LIPASE     Studies/Results: No results found.  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 Ok for diet today, NPO after midnight. Continue IV antibiotics.   LOS: 0 days    Izumi Mixon A MILLER , PA-C [facility] 08/09/2016, 8:02 AM Pager: [phone] Consults: [phone] Mon-Fri 7:00 am-4:30 pm Sat-Sun 7:00 am-11:30 am  

## 2016-08-10 NOTE — Op Note (Addendum)
Patient Name:           James Kim   Date of Surgery:        08/10/2016  Pre op Diagnosis:      Infected peritoneal dialysis catheter with recurrent peritonitis  Post op Diagnosis:    Same  Procedure:                 Removal of peritoneal dialysis catheter  Surgeon:                     Angelia Mould. Derrell Lolling, M.D., FACS  Assistant:                      OR staff   Indication for Assistant: n/a  Operative Indications:   This is a 41 year old man with end-stage renal disease.  Previously managed on peritoneal dialysis in [place].  Has had recurrent peritonitis.  He is being transitioned to hemodialysis at present time and has a subclavian catheter.  He is stable.  He was transferred to [facility] for removal of his peritoneal dialysis catheter.  Operative Findings:       The wound did not look infected.  The peritoneal fluid was clear but it was cultured.  The catheter entered the abdominal wall to the right of the umbilicus and the catheter exited the skin inferior to that.  There was a small cuff just inside the skin at the exit site and a large cuff below the abdominal fascia.  Everything was removed intact without difficulty.  Procedure in Detail:          Following the induction of general endotracheal anesthesia I cut the catheter so that only about 3 inches was visible externally.  This allowed [place] to prep and drape.  Surgical timeout was performed.  0.5% Marcaine was used as local infiltration anesthetic.  I made a small incision medial and lateral to the skin exit site and dissected out the Dacron cuff.  I then reopened the 4 cm transverse incision to the right of the umbilicus.  Dissection was carried down to the fascia.  Identified the catheter.  I cut the catheter so that the most external part could  be removed and discarded.  Using electrocautery I incised the anterior rectus fascia.  Carried dissection down through the rectus muscle to the large cuff which was dissected away  from the fibrous tissue and then the remaining catheter and cuff were removed intact.  There appeared to be about 15 inches of catheter within the abdominal cavity and it appeared to be removed intact.  Peritoneal fluid was cultured.  The wound was irrigated.  The rectus muscle was closed with 0 Vicryl sutures.  The anterior rectus sheath was closed transversely with interrupted sutures of #1 Novafil.  The subcutaneous tissue was closed loosely with a few 3-0 Vicryl and the skin was closed loosely with a few subcuticular 4-0 Monocryl and Steri-Strips.  I did not close the exit site.  I felt it would be best to leave that open to let it drain and heal by secondary intention.  Clean bandages were placed and the patient taken to PACU in stable condition.  EBL 15 mL.  Counts correct.  Complications none.     Angelia Mould. Derrell Lolling, M.D., FACS General and Minimally Invasive Surgery Breast and Colorectal Surgery  08/10/2016 11:10 AM

## 2016-08-10 NOTE — Progress Notes (Signed)
PROGRESS NOTE  James Kim  MLY:[id number] DOB: Sep 05, 1975 DOA: 08/08/2016 PCP: Beatriz Stallion, MD  Brief Narrative:  James Kim is a 41 y.o. male with medical history significant of ESRD, patient is on chronic PD.  He unfortunately has had 3 bouts of recurrent bacterial peritonitis over the past couple of months attributed to a small undetected crack in the external part of his HD catheter.  PD catheter removed on 4/5.  Body fluid culture is pending but had numerous WBC consistent with infection.  Will check CT scan of the abdomen and pelvis to exclude abscess as cause of recurrent SBP and persistent pain.    Assessment & Plan:   Principal Problem:   Dialysis-associated peritonitis (HCC) Active Problems:   End stage kidney disease (HCC)   Hypokalemia   Peritonitis associated with peritoneal dialysis (HCC)   Spontaneous bacterial peritonitis (HCC)  PD associated peritonitis and ongoing abdominal pain -  Continue vancomycin and zosyn pending culture data -  Peritoneal fluid culture NGTD -  Last PD culture grew corynebacterium -  CT abd/pelvis to exclude abscess  ESRD -  Started HD  Acute on chronic pain -  Continue oxycontin q12h with dilaudid for breakthrough pain  DVT prophylaxis:  lovenox Code Status:  Full  Family Communication:  patient alone Disposition Plan:  CT scan.  Likely home tomorrow   Consultants:   Nephrology  General surgery  Procedures:  hemodialysis  Antimicrobials:  Anti-infectives    Start     Dose/Rate Route Frequency Ordered Stop   08/10/16 1245  fluconazole (DIFLUCAN) tablet 100 mg  Status:  Discontinued    Comments:  For 10 days     100 mg Oral Daily 08/10/16 1232 08/10/16 1242   08/10/16 0930  cefoTEtan (CEFOTAN) 2 g in dextrose 5 % 50 mL IVPB     2 g 100 mL/hr over 30 Minutes Intravenous To [facility] 08/09/16 1356 08/10/16 1028   08/08/16 2200  piperacillin-tazobactam (ZOSYN) IVPB 3.375 g     3.375 g 12.5 mL/hr  over 240 Minutes Intravenous Every 12 hours 08/08/16 2057         Subjective: Abdominal pain continues.  Cough and movement worsen pain.  Denies fevers and chills.    Objective: Vitals:   08/10/16 1200 08/10/16 1215 08/10/16 1230 08/10/16 1635  BP: 127/90  139/87 127/77  Pulse: 84  84 82  Resp: 14  16 16   Temp:  98 F (36.7 C) 98.5 F (36.9 C) 98 F (36.7 C)  TempSrc:   Oral Oral  SpO2: 97%  98% 98%  Weight:      Height:        Intake/Output Summary (Last 24 hours) at 08/10/16 1801 Last data filed at 08/10/16 1300  Gross per 24 hour  Intake              930 ml  Output              225 ml  Net              705 ml   Filed Weights   08/08/16 2113 08/09/16 2154 08/10/16 0647  Weight: 85.5 kg (188 lb 6.4 oz) 86.2 kg (190 lb) 85.7 kg (188 lb 15 oz)    Examination:  General exam:  Adult male.  No acute distress.  Seen in PACU post procedure HEENT:  NCAT, MMM Respiratory system: Clear to auscultation bilaterally Cardiovascular system: Regular rate and rhythm, normal S1/S2. No murmurs, rubs,  gallops or clicks.  Warm extremities Gastrointestinal system: Normal active bowel sounds, soft, mildly distended.  Diffuse abdominal tenderness with persistent guarding and rebound.   MSK:  Normal tone and bulk, no lower extremity edema Neuro:  Grossly intact    Data Reviewed: I have personally reviewed following labs and imaging studies  CBC:  Recent Labs Lab 08/08/16 2122 08/10/16 1250  WBC 7.8 6.5  NEUTROABS 6.1  --   HGB 9.8* 9.7*  HCT 29.2* 29.6*  MCV 84.9 86.3  PLT 200 170   Basic Metabolic Panel:  Recent Labs Lab 08/08/16 2122 08/10/16 1250  NA 139  --   K 3.0*  --   CL 100*  --   CO2 29  --   GLUCOSE 107*  --   BUN 12  --   CREATININE 5.65* 9.04*  CALCIUM 7.9*  --    GFR: Estimated Creatinine Clearance: 11.4 mL/min (A) (by C-G formula based on SCr of 9.04 mg/dL (H)). Liver Function Tests:  Recent Labs Lab 08/08/16 2122  AST 14*  ALT 11*    ALKPHOS 53  BILITOT 0.4  PROT 5.3*  ALBUMIN 1.9*   No results for input(s): LIPASE, AMYLASE in the last 168 hours. No results for input(s): AMMONIA in the last 168 hours. Coagulation Profile: No results for input(s): INR, PROTIME in the last 168 hours. Cardiac Enzymes: No results for input(s): CKTOTAL, CKMB, CKMBINDEX, TROPONINI in the last 168 hours. BNP (last 3 results) No results for input(s): PROBNP in the last 8760 hours. HbA1C: No results for input(s): HGBA1C in the last 72 hours. CBG: No results for input(s): GLUCAP in the last 168 hours. Lipid Profile: No results for input(s): CHOL, HDL, LDLCALC, TRIG, CHOLHDL, LDLDIRECT in the last 72 hours. Thyroid Function Tests: No results for input(s): TSH, T4TOTAL, FREET4, T3FREE, THYROIDAB in the last 72 hours. Anemia Panel: No results for input(s): VITAMINB12, FOLATE, FERRITIN, TIBC, IRON, RETICCTPCT in the last 72 hours. Urine analysis: No results found for: COLORURINE, APPEARANCEUR, LABSPEC, PHURINE, GLUCOSEU, HGBUR, BILIRUBINUR, KETONESUR, PROTEINUR, UROBILINOGEN, NITRITE, LEUKOCYTESUR Sepsis Labs: @LABRCNTIP (procalcitonin:4,lacticidven:4)  ) Recent Results (from the past 240 hour(s))  Stat Gram stain     Status: None   Collection Time: 08/09/16  4:14 AM  Result Value Ref Range Status   Specimen Description PERITONEAL  Final   Special Requests NONE  Final   Gram Stain   Final    MODERATE WBC PRESENT,BOTH PMN AND MONONUCLEAR NO ORGANISMS SEEN    Report Status 08/09/2016 FINAL  Final  Body fluid culture     Status: None (Preliminary result)   Collection Time: 08/09/16  4:14 AM  Result Value Ref Range Status   Specimen Description PERITONEAL  Final   Special Requests NONE  Final   Culture NO GROWTH 1 DAY  Final   Report Status PENDING  Incomplete  Surgical pcr screen     Status: None   Collection Time: 08/09/16  8:31 AM  Result Value Ref Range Status   MRSA, PCR NEGATIVE NEGATIVE Final   Staphylococcus aureus  NEGATIVE NEGATIVE Final    Comment:        The Xpert SA Assay (FDA approved for NASAL specimens in patients over 46 years of age), is one component of a comprehensive surveillance program.  Test performance has been validated by [facility] for patients greater than or equal to 64 year old. It is not intended to diagnose infection nor to guide or monitor treatment.   Aerobic/Anaerobic Culture (surgical/deep wound)  Status: None (Preliminary result)   Collection Time: 08/10/16 10:48 AM  Result Value Ref Range Status   Specimen Description PERITONEAL  Final   Special Requests FLUID  Final   Gram Stain   Final    RARE WBC PRESENT, PREDOMINANTLY PMN NO ORGANISMS SEEN    Culture PENDING  Incomplete   Report Status PENDING  Incomplete      Radiology Studies: No results found.   Scheduled Meds: . acetaminophen      . [START ON 08/11/2016] allopurinol  100 mg Oral Daily  . amLODipine  10 mg Oral Daily  . [START ON 08/11/2016] enoxaparin (LOVENOX) injection  30 mg Subcutaneous Q24H  . escitalopram  10 mg Oral QHS  . feeding supplement (NEPRO CARB STEADY)  237 mL Oral BID BM  . HYDROmorphone      . metoprolol  100 mg Oral BID  . [START ON 08/11/2016] multivitamin with minerals  1 tablet Oral Daily  . oxyCODONE      . oxyCODONE  20 mg Oral Q12H  . pantoprazole  40 mg Oral QAC breakfast  . piperacillin-tazobactam (ZOSYN)  IV  3.375 g Intravenous Q12H  . potassium chloride SA  20 mEq Oral Daily  . sevelamer carbonate  1,600 mg Oral TID WC  . Vitamin D (Ergocalciferol)  50,000 Units Oral Q Wed   Continuous Infusions: . sodium chloride 10 mL/hr at 08/10/16 0936     LOS: 1 day    Time spent: 30 min    Renae Fickle, MD [facility] Pager [phone]  If 7PM-7AM, please contact night-coverage [email or web address] Password [email or web address] 08/10/2016, 6:01 PM

## 2016-08-10 NOTE — Anesthesia Procedure Notes (Signed)
Procedure Name: Intubation Date/Time: 08/10/2016 10:14 AM Performed by: Reine Just Pre-anesthesia Checklist: Patient identified, Emergency Drugs available, Suction available, Patient being monitored and Timeout performed Patient Re-evaluated:Patient Re-evaluated prior to inductionOxygen Delivery Method: Circle system utilized and Simple face mask Preoxygenation: Pre-oxygenation with 100% oxygen Intubation Type: IV induction Ventilation: Mask ventilation without difficulty Laryngoscope Size: Miller and 3 Grade View: Grade I Tube type: Oral Tube size: 7.5 mm Number of attempts: 1 Airway Equipment and Method: Patient positioned with wedge pillow and Stylet Placement Confirmation: ETT inserted through vocal cords under direct vision,  positive ETCO2 and breath sounds checked- equal and bilateral Secured at: 23 cm Tube secured with: Tape Dental Injury: Teeth and Oropharynx as per pre-operative assessment

## 2016-08-10 NOTE — Transfer of Care (Signed)
Immediate Anesthesia Transfer of Care Note  Patient: James Kim  Procedure(s) Performed: Procedure(s): CONTINUOUS AMBULATORY PERITONEAL DIALYSIS  (CAPD) CATHETER REMOVAL (N/A)  Patient Location: PACU  Anesthesia Type:General  Level of Consciousness: awake, alert  and oriented  Airway & Oxygen Therapy: Patient Spontanous Breathing and Patient connected to nasal cannula oxygen  Post-op Assessment: Report given to RN and Post -op Vital signs reviewed and stable  Post vital signs: Reviewed and stable  Last Vitals:  Vitals:   08/09/16 2154 08/10/16 0525  BP: 127/70 123/76  Pulse: 84 72  Resp: 16 16  Temp: 37.1 C 36.6 C    Last Pain:  Vitals:   08/10/16 0525  TempSrc: Oral  PainSc:          Complications: No apparent anesthesia complications

## 2016-08-10 NOTE — Consult Note (Signed)
Reason for Consult: To manage dialysis and dialysis related needs Referring Physician: Short  James Kim is an 41 y.o. male with past medical history significant for diabetes mellitus, hypertension, gout as well as ESRD. He reports being dialysis dependent since 2015 but has always been on peritoneal dialysis. He was doing well until a dialysis catheter tubing change but apparently left a split in the catheter that was gone unrealized.  As a result he developed peritonitis which is been refractory to treatment. Therefore, he presents today for removal of said infected peritoneal dialysis catheter. He is status post a PermCath placement and also had one hemodialysis treatment done on 4/3. Right now he is complaining of abdominal pain but is otherwise comfortable. The hemodialysis that was done on 4/3 was right after a full evening of peritoneal dialysis   Dialyzes at [place] unknown.  Is being set up on a TTS schedule. Wife is waiting to hear from dialysis unit to know what time he is to be there on Saturday . Access PermCath.  Past Medical History:  Diagnosis Date  . Anxiety   . Arthritis    "from my knees down" (08/08/2016)  . Childhood asthma   . 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 Dialysis-associated peritonitis ([place]) 08/08/2016  . ESRD on peritoneal dialysis ([place])    "01/29/2014-08/07/2016" (08/08/2016)  . GERD (gastroesophageal reflux disease)   . Heel spur    "both feet" (08/08/2016)  . Hypertension   . OSA on CPAP   . Pain management    "for pain BLE; knees down thru feet" (08/08/2016)  . Peritoneal dialysis catheter in place [facility])     Past Surgical History:  Procedure Laterality Date  . DIALYSIS/PERMA CATHETER INSERTION Right 08/03/2016  . INSERTION OF DIALYSIS CATHETER  ~ 2015   peritoneal dialysis    Family History  Problem Relation Age of Onset  . Stroke Mother   . Diabetes Father     Social History:  reports that he has been smoking.  He has  smoked for the past 22.00 years. His smokeless tobacco use includes Snuff. He reports that he does not drink alcohol or use drugs.  Allergies:  Allergies  Allergen Reactions  . Sulfa Antibiotics Anaphylaxis, Itching and Rash    Medications: I have reviewed the patient's current medications.   Results for orders placed or performed during the hospital encounter of 08/08/16 (from the past 48 hour(s))  HIV antibody (Routine Testing)     Status: None   Collection Time: 08/08/16  9:22 PM  Result Value Ref Range   HIV Screen 4th Generation wRfx Non Reactive Non Reactive    Comment: (NOTE) Performed At: [facility] [place], [place] [id number] Lindon Romp MD [id number]   Comprehensive metabolic panel     Status: Abnormal   Collection Time: 08/08/16  9:22 PM  Result Value Ref Range   Sodium 139 135 - 145 mmol/L   Potassium 3.0 (L) 3.5 - 5.1 mmol/L   Chloride 100 (L) 101 - 111 mmol/L   CO2 29 22 - 32 mmol/L   Glucose, Bld 107 (H) 65 - 99 mg/dL   BUN 12 6 - 20 mg/dL   Creatinine, Ser 5.65 (H) 0.61 - 1.24 mg/dL   Calcium 7.9 (L) 8.9 - 10.3 mg/dL   Total Protein 5.3 (L) 6.5 - 8.1 g/dL   Albumin 1.9 (L) 3.5 - 5.0 g/dL   AST 14 (L) 15 -  41 U/L   ALT 11 (L) 17 - 63 U/L   Alkaline Phosphatase 53 38 - 126 U/L   Total Bilirubin 0.4 0.3 - 1.2 mg/dL   GFR calc non Af Amer 12 (L) >60 mL/min   GFR calc Af Amer 13 (L) >60 mL/min    Comment: (NOTE) The eGFR has been calculated using the CKD EPI equation. This calculation has not been validated in all clinical situations. eGFR's persistently <60 mL/min signify possible Chronic Kidney Disease.    Anion gap 10 5 - 15  CBC WITH DIFFERENTIAL     Status: Abnormal   Collection Time: 08/08/16  9:22 PM  Result Value Ref Range   WBC 7.8 4.0 - 10.5 K/uL    Comment: REPEATED TO VERIFY WHITE COUNT CONFIRMED ON SMEAR    RBC 3.44 (L) 4.22 - 5.81 MIL/uL   Hemoglobin 9.8 (L) 13.0 - 17.0 g/dL   HCT 29.2 (L) 39.0 - 52.0 %    MCV 84.9 78.0 - 100.0 fL   MCH 28.5 26.0 - 34.0 pg   MCHC 33.6 30.0 - 36.0 g/dL   RDW 14.3 11.5 - 15.5 %   Platelets 200 150 - 400 K/uL   Neutrophils Relative % 78 %   Lymphocytes Relative 13 %   Monocytes Relative 9 %   Eosinophils Relative 0 %   Basophils Relative 0 %   Neutro Abs 6.1 1.7 - 7.7 K/uL   Lymphs Abs 1.0 0.7 - 4.0 K/uL   Monocytes Absolute 0.7 0.1 - 1.0 K/uL   Eosinophils Absolute 0.0 0.0 - 0.7 K/uL   Basophils Absolute 0.0 0.0 - 0.1 K/uL   Smear Review MORPHOLOGY UNREMARKABLE   Body fluid cell count with differential     Status: Abnormal   Collection Time: 08/09/16  4:14 AM  Result Value Ref Range   Fluid Type-FCT FLUID     Comment: PERITONEAL CORRECTED ON 04/04 AT 5916: PREVIOUSLY REPORTED AS Peritoneal    Color, Fluid STRAW (A) YELLOW   Appearance, Fluid CLOUDY (A) CLEAR   WBC, Fluid 4,367 (H) 0 - 1,000 cu mm   Neutrophil Count, Fluid 73 (H) 0 - 25 %   Lymphs, Fluid 12 %   Monocyte-Macrophage-Serous Fluid 15 (L) 50 - 90 %   Other Cells, Fluid OTHER CELLS IDENTIFIED AS MESOTHELIAL CELLS %  Stat Gram stain     Status: None   Collection Time: 08/09/16  4:14 AM  Result Value Ref Range   Specimen Description PERITONEAL    Special Requests NONE    Gram Stain      MODERATE WBC PRESENT,BOTH PMN AND MONONUCLEAR NO ORGANISMS SEEN    Report Status 08/09/2016 FINAL   Body fluid culture     Status: None (Preliminary result)   Collection Time: 08/09/16  4:14 AM  Result Value Ref Range   Specimen Description PERITONEAL    Special Requests NONE    Culture NO GROWTH 1 DAY    Report Status PENDING   Pathologist smear review     Status: None   Collection Time: 08/09/16  4:14 AM  Result Value Ref Range   Path Review MIXED INFLAMMATORY CELLS     Comment: Reviewed by Lennox Solders Lyndon Code, M.D. 08/09/2016   Surgical pcr screen     Status: None   Collection Time: 08/09/16  8:31 AM  Result Value Ref Range   MRSA, PCR NEGATIVE NEGATIVE   Staphylococcus aureus NEGATIVE  NEGATIVE    Comment:  The Xpert SA Assay (FDA approved for NASAL specimens in patients over 39 years of age), is one component of a comprehensive surveillance program.  Test performance has been validated by [facility] for patients greater than or equal to 75 year old. It is not intended to diagnose infection nor to guide or monitor treatment.     No results found.  ROS: Right now positive for abdominal pain. Negative for shortness of breath, nausea, chest pain. The remainder of the review systems is negative Blood pressure 139/87, pulse 84, temperature 98.5 F (36.9 C), temperature source Oral, resp. rate 16, height '5\' 6"'  (1.676 m), weight 85.7 kg (188 lb 15 oz), SpO2 98 %. General appearance: alert, cooperative and mild distress Resp: clear to auscultation bilaterally Cardio: regular rate and rhythm, S1, S2 normal, no murmur, click, rub or gallop GI: abnormal findings:  distended, obese and mild tenderness Diffusely and Incision Extremities: extremities normal, atraumatic, no cyanosis or edema Right-sided tunneled PermCath without drainage or tenderness  Assessment/Plan: 41 year old white male with ESRD. He presents for removal of infected peritoneal dialysis catheter 1 infected PD catheter- status post removal today.  Also being treated with Zosyn. Old culture data is unknown. Current culture data is pending.  Also reportedly received a dose of vancomycin on 4/3 2 ESRD: Will now require hemodialysis. He has a Chief Technology Officer in place. He received hemodialysis on 4/3. This was after a full night of peritoneal dialysis. Volume status is fine. Potassium and BUN were actually low on 4/3. Therefore, I don't suspect he will need any dialysis therapy today. Labs are pending for tomorrow AM. The plan is for him to possibly be discharged tomorrow. He has dialysis set up as an outpatient on Saturday. If labs tomorrow are adequate and he could probably wait until Saturday to get outpatient  dialysis. If his labs are abnormal we will do short treatment tomorrow but he will still go to outpatient on Saturday. This was explained to the patient 3 Hypertension: Well-controlled on Norvasc and Lopressor 4. Anemia of ESRD: Hemoglobin presurgery was 9.8. No treatment likely needed in-house. Will be managed with outpatient dialysis protocols 5. Metabolic Bone Disease: No phos to review. Patient is on Renvela as an outpatient this will be continued here     Allure Greaser A 08/10/2016, 1:30 PM

## 2016-08-10 NOTE — Anesthesia Preprocedure Evaluation (Signed)
Anesthesia Evaluation  Patient identified by MRN, date of birth, ID band Patient awake    Reviewed: Allergy & Precautions, NPO status , Patient's Chart, lab work & pertinent test results  Airway Mallampati: II  TM Distance: >3 FB Neck ROM: Full    Dental  (+) Teeth Intact, Dental Advisory Given   Pulmonary asthma , sleep apnea and Continuous Positive Airway Pressure Ventilation , Current Smoker,    breath sounds clear to auscultation       Cardiovascular hypertension, Pt. on medications and Pt. on home beta blockers  Rhythm:Regular Rate:Normal     Neuro/Psych PSYCHIATRIC DISORDERS Anxiety  Neuromuscular disease    GI/Hepatic Neg liver ROS, GERD  Medicated,  Endo/Other  negative endocrine ROS  Renal/GU ESRF and DialysisRenal disease  negative genitourinary   Musculoskeletal  (+) Arthritis ,   Abdominal   Peds negative pediatric ROS (+)  Hematology negative hematology ROS (+)   Anesthesia Other Findings   Reproductive/Obstetrics negative OB ROS                             Anesthesia Physical Anesthesia Plan  ASA: III  Anesthesia Plan: General   Post-op Pain Management:    Induction: Intravenous  Airway Management Planned: Oral ETT  Additional Equipment:   Intra-op Plan:   Post-operative Plan: Extubation in OR  Informed Consent: I have reviewed the patients History and Physical, chart, labs and discussed the procedure including the risks, benefits and alternatives for the proposed anesthesia with the patient or authorized representative who has indicated his/her understanding and acceptance.   Dental advisory given  Plan Discussed with: CRNA  Anesthesia Plan Comments:         Anesthesia Quick Evaluation

## 2016-08-10 NOTE — Interval H&P Note (Signed)
History and Physical Interval Note:  08/10/2016 9:32 AM  James Kim  has presented today for surgery, with the diagnosis of infected peritoneal dialysis catheter  The various methods of treatment have been discussed with the patient and family. After consideration of risks, benefits and other options for treatment, the patient has consented to  Procedure(s): CONTINUOUS AMBULATORY PERITONEAL DIALYSIS  (CAPD) CATHETER REMOVAL (N/A) as a surgical intervention .  The patient's history has been reviewed, patient examined, no change in status, stable for surgery.  I have reviewed the patient's chart and labs.  Questions were answered to the patient's satisfaction.     Ernestene Mention

## 2016-08-11 ENCOUNTER — Encounter (HOSPITAL_COMMUNITY): Payer: Self-pay | Admitting: General Surgery

## 2016-08-11 DIAGNOSIS — K5792 Diverticulitis of intestine, part unspecified, without perforation or abscess without bleeding: Secondary | ICD-10-CM

## 2016-08-11 DIAGNOSIS — K5732 Diverticulitis of large intestine without perforation or abscess without bleeding: Secondary | ICD-10-CM

## 2016-08-11 LAB — CBC
HEMATOCRIT: 29.5 % — AB (ref 39.0–52.0)
HEMOGLOBIN: 9.7 g/dL — AB (ref 13.0–17.0)
MCH: 28.2 pg (ref 26.0–34.0)
MCHC: 32.9 g/dL (ref 30.0–36.0)
MCV: 85.8 fL (ref 78.0–100.0)
Platelets: 186 10*3/uL (ref 150–400)
RBC: 3.44 MIL/uL — AB (ref 4.22–5.81)
RDW: 14 % (ref 11.5–15.5)
WBC: 7.8 10*3/uL (ref 4.0–10.5)

## 2016-08-11 LAB — BASIC METABOLIC PANEL
Anion gap: 12 (ref 5–15)
BUN: 36 mg/dL — ABNORMAL HIGH (ref 6–20)
CHLORIDE: 98 mmol/L — AB (ref 101–111)
CO2: 28 mmol/L (ref 22–32)
Calcium: 8.6 mg/dL — ABNORMAL LOW (ref 8.9–10.3)
Creatinine, Ser: 10.09 mg/dL — ABNORMAL HIGH (ref 0.61–1.24)
GFR calc Af Amer: 7 mL/min — ABNORMAL LOW (ref >60)
GFR calc non Af Amer: 6 mL/min — ABNORMAL LOW (ref >60)
Glucose, Bld: 78 mg/dL (ref 65–99)
POTASSIUM: 3.9 mmol/L (ref 3.5–5.1)
SODIUM: 138 mmol/L (ref 135–145)

## 2016-08-11 LAB — VANCOMYCIN, RANDOM: Vancomycin Rm: 15

## 2016-08-11 MED ORDER — CIPROFLOXACIN HCL 500 MG PO TABS: 500.0000 mg | ORAL_TABLET | Freq: Every day | ORAL | 0 refills | Status: DC

## 2016-08-11 MED ORDER — SEVELAMER CARBONATE 800 MG PO TABS: 1600.0000 mg | ORAL_TABLET | Freq: Three times a day (TID) | ORAL | 0 refills | Status: AC

## 2016-08-11 MED ORDER — AMLODIPINE BESYLATE 10 MG PO TABS: 10.0000 mg | ORAL_TABLET | Freq: Every day | ORAL | Status: DC

## 2016-08-11 MED ORDER — METRONIDAZOLE 500 MG PO TABS: 500.0000 mg | ORAL_TABLET | Freq: Two times a day (BID) | ORAL | 0 refills | Status: DC

## 2016-08-11 MED ORDER — PREGABALIN 25 MG PO CAPS: 25.0000 mg | ORAL_CAPSULE | Freq: Two times a day (BID) | ORAL | Status: DC | PRN

## 2016-08-11 MED ORDER — VANCOMYCIN HCL IN DEXTROSE 1-5 GM/200ML-% IV SOLN: 1000.0000 mg | INTRAVENOUS | Status: DC

## 2016-08-11 NOTE — Progress Notes (Signed)
Patient ID: James Kim, male   DOB: 1976-03-07, 41 y.o.   MRN: [id number]  [facility] Surgery Progress Note  1 Day Post-Op  Subjective: Patient states that he is very sore this morning, but medication is helping his pain. He is tolerating a solid diet. Denies n/v. Ambulating halls with no issues. Hopes to go home today.  Objective: Vital signs in last 24 hours: Temp:  [97.9 F (36.6 C)-99.2 F (37.3 C)] 98.9 F (37.2 C) (04/06 0736) Pulse Rate:  [82-94] 85 (04/06 0736) Resp:  [14-17] 16 (04/06 0736) BP: (112-139)/(62-90) 138/88 (04/06 0736) SpO2:  [93 %-99 %] 95 % (04/06 0736) Weight:  [192 lb 14.4 oz (87.5 kg)] 192 lb 14.4 oz (87.5 kg) (04/05 2128) Last BM Date: 08/10/16  Intake/Output from previous day: 04/05 0701 - 04/06 0700 In: 930 [P.O.:480; I.V.:400; IV Piggyback:50] Out: 25 [Blood:25] Intake/Output this shift: No intake/output data recorded.  PE: Gen:  Alert, NAD, pleasant Pulm:  Effort normal Abd: Soft, ND, +BS, incisions C/D/I with steris in place, no purulent drainage or surrounding erythema  Lab Results:   Recent Labs  08/10/16 1250 08/11/16 0455  WBC 6.5 7.8  HGB 9.7* 9.7*  HCT 29.6* 29.5*  PLT 170 186   BMET  Recent Labs  08/08/16 2122 08/10/16 1250 08/11/16 0455  NA 139  --  138  K 3.0*  --  3.9  CL 100*  --  98*  CO2 29  --  28  GLUCOSE 107*  --  78  BUN 12  --  36*  CREATININE 5.65* 9.04* 10.09*  CALCIUM 7.9*  --  8.6*   PT/INR No results for input(s): LABPROT, INR in the last 72 hours. CMP     Component Value Date/Time   NA 138 08/11/2016 0455   K 3.9 08/11/2016 0455   CL 98 (L) 08/11/2016 0455   CO2 28 08/11/2016 0455   GLUCOSE 78 08/11/2016 0455   BUN 36 (H) 08/11/2016 0455   CREATININE 10.09 (H) 08/11/2016 0455   CREATININE 2.82 (H) 04/11/2013 1201   CALCIUM 8.6 (L) 08/11/2016 0455   PROT 5.3 (L) 08/08/2016 2122   ALBUMIN 1.9 (L) 08/08/2016 2122   AST 14 (L) 08/08/2016 2122   ALT 11 (L) 08/08/2016 2122    ALKPHOS 53 08/08/2016 2122   BILITOT 0.4 08/08/2016 2122   GFRNONAA 6 (L) 08/11/2016 0455   GFRAA 7 (L) 08/11/2016 0455   Lipase  No results found for: LIPASE     Studies/Results: Ct Abdomen Pelvis W Contrast  Result Date: 08/10/2016 CLINICAL DATA:  Recurrent SBP requiring removal of PD catheter. Still having pain despite multiple courses of antibiotics, IV antibiotics, and removal of PD catheter. Please assess for abscess. EXAM: CT ABDOMEN AND PELVIS WITH CONTRAST TECHNIQUE: Multidetector CT imaging of the abdomen and pelvis was performed using the standard protocol following bolus administration of intravenous contrast. CONTRAST:  ISOVUE-300 IOPAMIDOL (ISOVUE-300) INJECTION 61% COMPARISON:  None. FINDINGS: Lower chest: Linear atelectasis. No evidence of pneumonia or edema. Heart normal in size. Hepatobiliary: Liver mildly enlarged measuring 23 cm in greatest transverse dimension. No liver mass or focal lesion. Normal gallbladder. No bile duct dilation. Pancreas: Unremarkable. No pancreatic ductal dilatation or surrounding inflammatory changes. Spleen: Spleen mildly enlarged measuring 14.4 x 5.1 x 13.4 cm. No splenic mass or focal lesion. Adrenals/Urinary Tract: No adrenal masses. Significant bilateral renal cortical thinning leading to small kidneys. No renal masses or stones. No hydronephrosis. Normal ureters. Bladder is mildly distended. No mass  or stone. There is hazy inflammatory type stranding above the bladder. Stomach/Bowel: There is wall thickening an adjacent hazy inflammatory infiltration surrounding the mid sigmoid colon where there are multiple diverticula. There is some adjacent free fluid and no defined abscess. There is no extraluminal air. There are scattered colonic diverticula with no other associated inflammatory change. Vascular/Lymphatic: No significant vascular findings are present. No enlarged abdominal or pelvic lymph nodes. Reproductive: Unremarkable Other: Small amount  ascites collects adjacent to the liver in tracks along the pericolic gutters and between the use of the the inferior mesenteric. There are bubbles of air along the low mid right rectus abdominus muscle with overlying subcutaneous fat infiltration and small bubbles of air. This was likely the insertion site with the previous catheter. No free intraperitoneal air. Musculoskeletal: No fracture or acute finding. No osteoblastic or osteolytic lesions. IMPRESSION: 1. No defined abscess. 2. Wall thickening and adjacent inflammation along the mid sigmoid colon where there are multiple diverticula. This suggests uncomplicated acute diverticulitis. 3. Small amount ascites. 4. Small bubbles of air and some fatty infiltration along the right mid abdomen and rectus abdominus muscle which presumably was the insertion site with the previous catheter. No abdominal wall abscess. 5. Mild pattern splenomegaly. 6. Atrophic kidneys consistent with renal failure. No hydronephrosis. Electronically Signed   By: Amie Portland M.D.   On: 08/10/2016 22:06    Anti-infectives: Anti-infectives    Start     Dose/Rate Route Frequency Ordered Stop   08/10/16 1245  fluconazole (DIFLUCAN) tablet 100 mg  Status:  Discontinued    Comments:  For 10 days     100 mg Oral Daily 08/10/16 1232 08/10/16 1242   08/10/16 0930  cefoTEtan (CEFOTAN) 2 g in dextrose 5 % 50 mL IVPB     2 g 100 mL/hr over 30 Minutes Intravenous To [facility] 08/09/16 1356 08/10/16 1028   08/08/16 2200  piperacillin-tazobactam (ZOSYN) IVPB 3.375 g     3.375 g 12.5 mL/hr over 240 Minutes Intravenous Every 12 hours 08/08/16 2057         Assessment/Plan Infected peritoneal dialysis catheter with recurrent peritonitis S/p removal of peritoneal dialysis catheter 4/5 Dr. Derrell Lolling - POD 1 - surgical path pending  ESRD - transitioning to HD Rheumatoid arthritis Acute on chronic pain  ID - zosyn 4/3>> FEN - renal diet VTE - SCDs  Plan - Patient is  tolerating a regular diet, wound looks good, and his pain is well controlled. Bandage changed and discussed wound care with patient.  From a surgical standpoint he is ready for discharge. He may follow-up with Dr. Derrell Lolling PRN. General surgery will sign off, please call with concerns.   LOS: 2 days    Edson Snowball , [facility] Surgery 08/11/2016, 7:43 AM Pager: [phone] Consults: [phone] Mon-Fri 7:00 am-4:30 pm Sat-Sun 7:00 am-11:30 am

## 2016-08-11 NOTE — Progress Notes (Signed)
Pharmacy Antibiotic Note  James Kim is a 42 y.o. male admitted on 08/08/2016 for removal of PD catheter.  Pharmacy has been consulted for vancomycin and Zosyn dosing for peritonitis.  The patient was noted to have recently started the transition from PD to HD. He received his first HD session outpatient and was loaded with Vancomycin 1500 mg x 1 at that time. A Vancomycin random level this morning was therapeutic at 15 mcg/ml. Noted plans are for outpatient HD on 4/7.   Plan: 1. Start Vancomycin 1g/HD-TTS 2. Continue Zosyn 3.375gm IV Q12H, 4 hr infusion 3. Monitor HD schedule/tolerance, clinical progress, vanc trough as indicated   Height: 5\' 6"  (167.6 cm) Weight: 192 lb 14.4 oz (87.5 kg) IBW/kg (Calculated) : 63.8   Temp (24hrs), Avg:98.7 F (37.1 C), Min:98 F (36.7 C), Max:99.2 F (37.3 C)   Recent Labs Lab 08/08/16 2122 08/10/16 1250 08/11/16 0455  WBC 7.8 6.5 7.8  CREATININE 5.65* 9.04* 10.09*  VANCORANDOM  --   --  15    Estimated Creatinine Clearance: 10.3 mL/min (A) (by C-G formula based on SCr of 10.09 mg/dL (H)).    Allergies  Allergen Reactions  . Sulfa Antibiotics Anaphylaxis, Itching and Rash   Vanc 4/3 >> *1500mg  given at outpt HD center per patient report * 4/6 AM VR 15 mcg/ml Zosyn 4/3 >>  4/4 PD fluid >> ngtd 4/4 MRSA PCR >> neg  Thank you for allowing pharmacy to be a part of this patient's care.  6/4, PharmD, BCPS Clinical Pharmacist Pager: [phone] Clinical phone for 08/11/2016 from 7a-3:30p: [phone] If after 3:30p, please call main pharmacy at: [phone] 08/11/2016 1:24 PM

## 2016-08-11 NOTE — Anesthesia Postprocedure Evaluation (Signed)
Anesthesia Post Note  Patient: James Kim  Procedure(s) Performed: Procedure(s) (LRB): CONTINUOUS AMBULATORY PERITONEAL DIALYSIS  (CAPD) CATHETER REMOVAL (N/A)  Patient location during evaluation: PACU Anesthesia Type: General Level of consciousness: awake and alert Pain management: pain level controlled Vital Signs Assessment: post-procedure vital signs reviewed and stable Respiratory status: spontaneous breathing, nonlabored ventilation, respiratory function stable and patient connected to nasal cannula oxygen Cardiovascular status: blood pressure returned to baseline and stable Postop Assessment: no signs of nausea or vomiting Anesthetic complications: no       Last Vitals:  Vitals:   08/11/16 0434 08/11/16 0736  BP: 112/62 138/88  Pulse: 92 85  Resp: 16 16  Temp: 37.1 C 37.2 C    Last Pain:  Vitals:   08/11/16 0736  TempSrc: Oral  PainSc: 6                  Shelton Silvas

## 2016-08-11 NOTE — Discharge Instructions (Signed)
Abdominal wound care:    Keep a clean dry bandage on the wound for about a week    You may take a shower starting Saturday.  No tub baths for about a month    The Steri-Strips can be peeled off in about 10 days    No sports or lifting more than 15 pounds for 1 month    Contact Dr. Derrell Lolling if there are any problems with wound healing such as infection or bleeding

## 2016-08-11 NOTE — Discharge Summary (Addendum)
Physician Discharge Summary  KENAI FLUEGEL ZOX:[id number] DOB: [birth year] DOA: 08/08/2016  PCP: Beatriz Stallion, MD  Admit date: 08/08/2016 Discharge date: 08/11/2016  Admitted From: home  Disposition:  home  Recommendations for Outpatient Follow-up:  1. Follow up with General Surgery as needed 2. Hemodialysis TODAY at outpatient center 3. Will need colonoscopy after completion of antibiotics for SBP/diverticulitis 4. Follow up pending body fluid culture 5. PCP to refer to GI for possible colonoscopy   Home Health:  none  Equipment/Devices:  none   Discharge Condition:  Stable, improved CODE STATUS:  Full code  Diet recommendation:  renal   Brief/Interim Summary:  Derian Dimalanta Collinsis a 41 y.o.malewith medical history significant of ESRD, patient is on chronic PD. He unfortunately has had 3 bouts of recurrent bacterial peritonitis over the past couple of months attributed to a small undetected crack in the external part of his HD catheter.  He was started on zosyn.  PD catheter removed on 4/5.  Body fluid culture demonstrated numerous WBC consistent with infection but so far culture is negative.  Due to recurrent infections and persistent abdominal pain despite anbiotics, CT scan of the abdomen and pelvis was performed to exclude abscess.  CT scan revealed evidence of acute simple mid-sigmoid colon diverticulitis.  He was given prescriptions for cipro/flagyl to complete a 14 day course of diverticulitis and recurrent SBP while cultures are pending.  He should resume HD at his outpatient center and follow up with his regular nephrologist in [place].  Due to the presence of diverticulitis/diverticulosis, would recommend follow up with gastroenterology after completion of antibiotics for possible colonoscopy.    Discharge Diagnoses:  Principal Problem:   Dialysis-associated peritonitis (HCC) Active Problems:   End stage kidney disease (HCC)   Hypokalemia   Peritonitis associated  with peritoneal dialysis (HCC)   Spontaneous bacterial peritonitis (HCC)   Acute diverticulitis  PD-associated recurrent peritonitis.   -  Started on zosyn initially and transitioned to cipro/flagyl at discharge to complete a 14-day course -  Peritoneal fluid culture from admission is NGTD -  General surgery removed his PD catheter on 4/5 -  CT ab/p negative for abscess  Sigmoid colon diverticulitis without complication.  This area may be inflamed secondary to peritonitis or location of recent PD catheter also.   -  Educated about importance of avoiding constipation -  Given antibiotics as above -  Recommend GI follow up as outpatient  ESRD, nephrology consulted but he did not require hemodialysis as inpatient and was discharged in time to resume outpatient HD at his center in [place].  Acute on chronic pain -  Continued oxycontin q12h with oxycodone for breakthrough pain -  Recommend tapering off high dose narcotics but defer to PCP   Discharge Instructions  Discharge Instructions    Call MD for:  difficulty breathing, headache or visual disturbances    Complete by:  As directed    Call MD for:  extreme fatigue    Complete by:  As directed    Call MD for:  hives    Complete by:  As directed    Call MD for:  persistant dizziness or light-headedness    Complete by:  As directed    Call MD for:  persistant nausea and vomiting    Complete by:  As directed    Call MD for:  redness, tenderness, or signs of infection (pain, swelling, redness, odor or green/yellow discharge around incision site)    Complete by:  As directed  Call MD for:  severe uncontrolled pain    Complete by:  As directed    Call MD for:  temperature >100.4    Complete by:  As directed    Diet - low sodium heart healthy    Complete by:  As directed    Increase activity slowly    Complete by:  As directed        Medication List    STOP taking these medications   hydrOXYzine 25 MG tablet Commonly  known as:  ATARAX/VISTARIL   potassium chloride SA 20 MEQ tablet Commonly known as:  K-DUR,KLOR-CON     TAKE these medications   allopurinol 100 MG tablet Commonly known as:  ZYLOPRIM Take 100 mg by mouth daily.   ALPRAZolam 0.5 MG tablet Commonly known as:  XANAX TAKE ONE TABLET BY MOUTH TWO TIMES A DAY AS NEEDED. What changed:  how much to take  how to take this  when to take this  reasons to take this  additional instructions   amLODipine 10 MG tablet Commonly known as:  NORVASC Take 10 mg by mouth every other day.   ciprofloxacin 500 MG tablet Commonly known as:  CIPRO Take 1 tablet (500 mg total) by mouth at bedtime.   escitalopram 20 MG tablet Commonly known as:  LEXAPRO Take 20 mg by mouth at bedtime.   fluconazole 100 MG tablet Commonly known as:  DIFLUCAN Take 100 mg by mouth daily. For 10 days   furosemide 20 MG tablet Commonly known as:  LASIX Take 40 mg by mouth 2 (two) times daily as needed for fluid.   metoprolol 50 MG tablet Commonly known as:  LOPRESSOR Take 50 mg by mouth See admin instructions. Take 50 mg by mouth twice daily every other day   metroNIDAZOLE 500 MG tablet Commonly known as:  FLAGYL Take 1 tablet (500 mg total) by mouth 2 (two) times daily.   MULTIVITAMIN PO Take 1 tablet by mouth daily.   oxyCODONE 20 mg 12 hr tablet Commonly known as:  OXYCONTIN Take 1 tablet (20 mg total) by mouth every 12 (twelve) hours.   oxyCODONE-acetaminophen 10-325 MG tablet Commonly known as:  PERCOCET Take 1 tablet by mouth 5 (five) times daily as needed for pain.   pantoprazole 40 MG tablet Commonly known as:  PROTONIX Take 40 mg by mouth daily before breakfast.   pregabalin 25 MG capsule Commonly known as:  LYRICA Take 1 capsule (25 mg total) by mouth 2 (two) times daily as needed (for fibromyalgia flare-up).   sevelamer carbonate 800 MG tablet Commonly known as:  RENVELA Take 2 tablets (1,600 mg total) by mouth 3 (three) times  daily with meals.   Vitamin D (Ergocalciferol) 50000 units Caps capsule Commonly known as:  DRISDOL Take 50,000 Units by mouth every Wednesday.      Follow-up Information    Ernestene Mention, MD. Call.   Specialty:  General Surgery Why:  As needed Contact information: [address] [place] [address] [phone]        Beatriz Stallion, MD. Schedule an appointment as soon as possible for a visit in 1 week(s).   Specialty:  Internal Medicine         Allergies  Allergen Reactions  . Sulfa Antibiotics Anaphylaxis, Itching and Rash    Consultations: General Surgery Nephrology   Procedures/Studies: Ct Abdomen Pelvis W Contrast  Result Date: 08/10/2016 CLINICAL DATA:  Recurrent SBP requiring removal of PD catheter. Still having pain  despite multiple courses of antibiotics, IV antibiotics, and removal of PD catheter. Please assess for abscess. EXAM: CT ABDOMEN AND PELVIS WITH CONTRAST TECHNIQUE: Multidetector CT imaging of the abdomen and pelvis was performed using the standard protocol following bolus administration of intravenous contrast. CONTRAST:  ISOVUE-300 IOPAMIDOL (ISOVUE-300) INJECTION 61% COMPARISON:  None. FINDINGS: Lower chest: Linear atelectasis. No evidence of pneumonia or edema. Heart normal in size. Hepatobiliary: Liver mildly enlarged measuring 23 cm in greatest transverse dimension. No liver mass or focal lesion. Normal gallbladder. No bile duct dilation. Pancreas: Unremarkable. No pancreatic ductal dilatation or surrounding inflammatory changes. Spleen: Spleen mildly enlarged measuring 14.4 x 5.1 x 13.4 cm. No splenic mass or focal lesion. Adrenals/Urinary Tract: No adrenal masses. Significant bilateral renal cortical thinning leading to small kidneys. No renal masses or stones. No hydronephrosis. Normal ureters. Bladder is mildly distended. No mass or stone. There is hazy inflammatory type stranding above the bladder. Stomach/Bowel: There is  wall thickening an adjacent hazy inflammatory infiltration surrounding the mid sigmoid colon where there are multiple diverticula. There is some adjacent free fluid and no defined abscess. There is no extraluminal air. There are scattered colonic diverticula with no other associated inflammatory change. Vascular/Lymphatic: No significant vascular findings are present. No enlarged abdominal or pelvic lymph nodes. Reproductive: Unremarkable Other: Small amount ascites collects adjacent to the liver in tracks along the pericolic gutters and between the use of the the inferior mesenteric. There are bubbles of air along the low mid right rectus abdominus muscle with overlying subcutaneous fat infiltration and small bubbles of air. This was likely the insertion site with the previous catheter. No free intraperitoneal air. Musculoskeletal: No fracture or acute finding. No osteoblastic or osteolytic lesions. IMPRESSION: 1. No defined abscess. 2. Wall thickening and adjacent inflammation along the mid sigmoid colon where there are multiple diverticula. This suggests uncomplicated acute diverticulitis. 3. Small amount ascites. 4. Small bubbles of air and some fatty infiltration along the right mid abdomen and rectus abdominus muscle which presumably was the insertion site with the previous catheter. No abdominal wall abscess. 5. Mild pattern splenomegaly. 6. Atrophic kidneys consistent with renal failure. No hydronephrosis. Electronically Signed   By: Amie Portland M.D.   On: 08/10/2016 22:06   PD catheter removal on 4/5  Subjective: Still having diffuse abdominal pain.  Had pain with dressing change which was performed by general surgery today.  Denies nausea, vomiting and has been eating and drinking okay.   Discharge Exam: Vitals:   08/11/16 0736 08/11/16 0955  BP: 138/88 125/70  Pulse: 85   Resp: 16   Temp: 98.9 F (37.2 C)    Vitals:   08/10/16 2350 08/11/16 0434 08/11/16 0736 08/11/16 0955  BP:  112/62  138/88 125/70  Pulse:  92 85   Resp: 16 16 16    Temp:  98.8 F (37.1 C) 98.9 F (37.2 C)   TempSrc:  Oral Oral   SpO2:  93% 95%   Weight:      Height:        General: Pt is alert, awake, not in acute distress, walking around room Cardiovascular: RRR, S1/S2 +, no rubs, no gallops Respiratory: CTA bilaterally, no wheezing, no rhonchi Abdominal:  NABS, soft but voluntary guarding and rebound persist.  Overall, however, pain seems improved from admission.   Extremities: 1+ lower extremity edema, no cyanosis    The results of significant diagnostics from this hospitalization (including imaging, microbiology, ancillary and laboratory) are listed below for reference.  Microbiology: Recent Results (from the past 240 hour(s))  Stat Gram stain     Status: None   Collection Time: 08/09/16  4:14 AM  Result Value Ref Range Status   Specimen Description PERITONEAL  Final   Special Requests NONE  Final   Gram Stain   Final    MODERATE WBC PRESENT,BOTH PMN AND MONONUCLEAR NO ORGANISMS SEEN    Report Status 08/09/2016 FINAL  Final  Body fluid culture     Status: None (Preliminary result)   Collection Time: 08/09/16  4:14 AM  Result Value Ref Range Status   Specimen Description PERITONEAL  Final   Special Requests NONE  Final   Culture NO GROWTH 2 DAYS  Final   Report Status PENDING  Incomplete  Surgical pcr screen     Status: None   Collection Time: 08/09/16  8:31 AM  Result Value Ref Range Status   MRSA, PCR NEGATIVE NEGATIVE Final   Staphylococcus aureus NEGATIVE NEGATIVE Final    Comment:        The Xpert SA Assay (FDA approved for NASAL specimens in patients over 71 years of age), is one component of a comprehensive surveillance program.  Test performance has been validated by [facility] for patients greater than or equal to 91 year old. It is not intended to diagnose infection nor to guide or monitor treatment.   Aerobic/Anaerobic Culture (surgical/deep wound)      Status: None (Preliminary result)   Collection Time: 08/10/16 10:48 AM  Result Value Ref Range Status   Specimen Description PERITONEAL  Final   Special Requests FLUID  Final   Gram Stain   Final    RARE WBC PRESENT, PREDOMINANTLY PMN NO ORGANISMS SEEN    Culture NO GROWTH 1 DAY  Final   Report Status PENDING  Incomplete     Labs: BNP (last 3 results) No results for input(s): BNP in the last 8760 hours. Basic Metabolic Panel:  Recent Labs Lab 08/08/16 2122 08/10/16 1250 08/11/16 0455  NA 139  --  138  K 3.0*  --  3.9  CL 100*  --  98*  CO2 29  --  28  GLUCOSE 107*  --  78  BUN 12  --  36*  CREATININE 5.65* 9.04* 10.09*  CALCIUM 7.9*  --  8.6*   Liver Function Tests:  Recent Labs Lab 08/08/16 2122  AST 14*  ALT 11*  ALKPHOS 53  BILITOT 0.4  PROT 5.3*  ALBUMIN 1.9*   No results for input(s): LIPASE, AMYLASE in the last 168 hours. No results for input(s): AMMONIA in the last 168 hours. CBC:  Recent Labs Lab 08/08/16 2122 08/10/16 1250 08/11/16 0455  WBC 7.8 6.5 7.8  NEUTROABS 6.1  --   --   HGB 9.8* 9.7* 9.7*  HCT 29.2* 29.6* 29.5*  MCV 84.9 86.3 85.8  PLT 200 170 186   Cardiac Enzymes: No results for input(s): CKTOTAL, CKMB, CKMBINDEX, TROPONINI in the last 168 hours. BNP: Invalid input(s): POCBNP CBG: No results for input(s): GLUCAP in the last 168 hours. D-Dimer No results for input(s): DDIMER in the last 72 hours. Hgb A1c No results for input(s): HGBA1C in the last 72 hours. Lipid Profile No results for input(s): CHOL, HDL, LDLCALC, TRIG, CHOLHDL, LDLDIRECT in the last 72 hours. Thyroid function studies No results for input(s): TSH, T4TOTAL, T3FREE, THYROIDAB in the last 72 hours.  Invalid input(s): FREET3 Anemia work up No results for input(s): VITAMINB12, FOLATE, FERRITIN, TIBC, IRON, RETICCTPCT  in the last 72 hours. Urinalysis No results found for: COLORURINE, APPEARANCEUR, LABSPEC, PHURINE, GLUCOSEU, HGBUR, BILIRUBINUR, KETONESUR,  PROTEINUR, UROBILINOGEN, NITRITE, LEUKOCYTESUR Sepsis Labs Invalid input(s): PROCALCITONIN,  WBC,  LACTICIDVEN   Time coordinating discharge: Over 30 minutes  SIGNED:   Renae Fickle, MD  [facility] 08/11/2016, 5:56 PM Pager   If 7PM-7AM, please contact night-coverage [email or web address] Password [email or web address]

## 2016-08-11 NOTE — Progress Notes (Signed)
Subjective: Interval History: has complaints sore at op site.  Objective: Vital signs in last 24 hours: Temp:  [97.9 F (36.6 C)-99.2 F (37.3 C)] 98.9 F (37.2 C) (04/06 0736) Pulse Rate:  [82-94] 85 (04/06 0736) Resp:  [14-17] 16 (04/06 0736) BP: (112-139)/(62-90) 125/70 (04/06 0955) SpO2:  [93 %-99 %] 95 % (04/06 0736) Weight:  [87.5 kg (192 lb 14.4 oz)] 87.5 kg (192 lb 14.4 oz) (04/05 2128) Weight change: 1.315 kg (2 lb 14.4 oz)  Intake/Output from previous day: 04/05 0701 - 04/06 0700 In: 930 [P.O.:480; I.V.:400; IV Piggyback:50] Out: 25 [Blood:25] Intake/Output this shift: No intake/output data recorded.  General appearance: alert, cooperative, no distress and pale Resp: diminished breath sounds bilaterally Chest wall: RIJ cath Cardio: S1, S2 normal and systolic murmur: holosystolic 2/6, blowing at apex GI: pos bs,mild distension, dressing midline Extremities: edema 1+  Lab Results:  Recent Labs  08/10/16 1250 08/11/16 0455  WBC 6.5 7.8  HGB 9.7* 9.7*  HCT 29.6* 29.5*  PLT 170 186   BMET:  Recent Labs  08/08/16 2122 08/10/16 1250 08/11/16 0455  NA 139  --  138  K 3.0*  --  3.9  CL 100*  --  98*  CO2 29  --  28  GLUCOSE 107*  --  78  BUN 12  --  36*  CREATININE 5.65* 9.04* 10.09*  CALCIUM 7.9*  --  8.6*   No results for input(s): PTH in the last 72 hours. Iron Studies: No results for input(s): IRON, TIBC, TRANSFERRIN, FERRITIN in the last 72 hours.  Studies/Results: Ct Abdomen Pelvis W Contrast  Result Date: 08/10/2016 CLINICAL DATA:  Recurrent SBP requiring removal of PD catheter. Still having pain despite multiple courses of antibiotics, IV antibiotics, and removal of PD catheter. Please assess for abscess. EXAM: CT ABDOMEN AND PELVIS WITH CONTRAST TECHNIQUE: Multidetector CT imaging of the abdomen and pelvis was performed using the standard protocol following bolus administration of intravenous contrast. CONTRAST:  ISOVUE-300 IOPAMIDOL  (ISOVUE-300) INJECTION 61% COMPARISON:  None. FINDINGS: Lower chest: Linear atelectasis. No evidence of pneumonia or edema. Heart normal in size. Hepatobiliary: Liver mildly enlarged measuring 23 cm in greatest transverse dimension. No liver mass or focal lesion. Normal gallbladder. No bile duct dilation. Pancreas: Unremarkable. No pancreatic ductal dilatation or surrounding inflammatory changes. Spleen: Spleen mildly enlarged measuring 14.4 x 5.1 x 13.4 cm. No splenic mass or focal lesion. Adrenals/Urinary Tract: No adrenal masses. Significant bilateral renal cortical thinning leading to small kidneys. No renal masses or stones. No hydronephrosis. Normal ureters. Bladder is mildly distended. No mass or stone. There is hazy inflammatory type stranding above the bladder. Stomach/Bowel: There is wall thickening an adjacent hazy inflammatory infiltration surrounding the mid sigmoid colon where there are multiple diverticula. There is some adjacent free fluid and no defined abscess. There is no extraluminal air. There are scattered colonic diverticula with no other associated inflammatory change. Vascular/Lymphatic: No significant vascular findings are present. No enlarged abdominal or pelvic lymph nodes. Reproductive: Unremarkable Other: Small amount ascites collects adjacent to the liver in tracks along the pericolic gutters and between the use of the the inferior mesenteric. There are bubbles of air along the low mid right rectus abdominus muscle with overlying subcutaneous fat infiltration and small bubbles of air. This was likely the insertion site with the previous catheter. No free intraperitoneal air. Musculoskeletal: No fracture or acute finding. No osteoblastic or osteolytic lesions. IMPRESSION: 1. No defined abscess. 2. Wall thickening and adjacent inflammation along  the mid sigmoid colon where there are multiple diverticula. This suggests uncomplicated acute diverticulitis. 3. Small amount ascites. 4. Small  bubbles of air and some fatty infiltration along the right mid abdomen and rectus abdominus muscle which presumably was the insertion site with the previous catheter. No abdominal wall abscess. 5. Mild pattern splenomegaly. 6. Atrophic kidneys consistent with renal failure. No hydronephrosis. Electronically Signed   By: Amie Portland M.D.   On: 08/10/2016 22:06    I have reviewed the patient's current medications.  Assessment/Plan: 1 ESRD for HD at home unit on Sat am 2 Peritonitis AB 3 PD cath removal 4 anemia 5 DM P d/c . AB as outpatient    LOS: 2 days   James Kim L 08/11/2016,10:34 AM

## 2016-08-11 NOTE — Progress Notes (Signed)
Pt discharge to home, discharge instructions, medications and follow up appointments discussed and reviewed with pt, verbalized understanding. IV discontinued, cath intact, site clean and dry. Pt was escorted out of the unit in wheelchair accompanied with wife, took all belongings with them.

## 2016-08-12 DIAGNOSIS — D631 Anemia in chronic kidney disease: Secondary | ICD-10-CM | POA: Diagnosis not present

## 2016-08-12 DIAGNOSIS — D500 Iron deficiency anemia secondary to blood loss (chronic): Secondary | ICD-10-CM | POA: Diagnosis not present

## 2016-08-12 DIAGNOSIS — E8809 Other disorders of plasma-protein metabolism, not elsewhere classified: Secondary | ICD-10-CM | POA: Diagnosis not present

## 2016-08-12 DIAGNOSIS — K659 Peritonitis, unspecified: Secondary | ICD-10-CM | POA: Diagnosis not present

## 2016-08-12 DIAGNOSIS — N2581 Secondary hyperparathyroidism of renal origin: Secondary | ICD-10-CM | POA: Diagnosis not present

## 2016-08-12 DIAGNOSIS — N186 End stage renal disease: Secondary | ICD-10-CM | POA: Diagnosis not present

## 2016-08-12 LAB — BODY FLUID CULTURE: CULTURE: NO GROWTH

## 2016-08-14 DIAGNOSIS — D500 Iron deficiency anemia secondary to blood loss (chronic): Secondary | ICD-10-CM | POA: Diagnosis not present

## 2016-08-14 DIAGNOSIS — N2581 Secondary hyperparathyroidism of renal origin: Secondary | ICD-10-CM | POA: Diagnosis not present

## 2016-08-14 DIAGNOSIS — D631 Anemia in chronic kidney disease: Secondary | ICD-10-CM | POA: Diagnosis not present

## 2016-08-14 DIAGNOSIS — N186 End stage renal disease: Secondary | ICD-10-CM | POA: Diagnosis not present

## 2016-08-14 DIAGNOSIS — E8809 Other disorders of plasma-protein metabolism, not elsewhere classified: Secondary | ICD-10-CM | POA: Diagnosis not present

## 2016-08-14 DIAGNOSIS — K659 Peritonitis, unspecified: Secondary | ICD-10-CM | POA: Diagnosis not present

## 2016-08-15 LAB — AEROBIC/ANAEROBIC CULTURE (SURGICAL/DEEP WOUND): CULTURE: NO GROWTH

## 2016-08-16 DIAGNOSIS — N186 End stage renal disease: Secondary | ICD-10-CM | POA: Diagnosis not present

## 2016-08-16 DIAGNOSIS — D500 Iron deficiency anemia secondary to blood loss (chronic): Secondary | ICD-10-CM | POA: Diagnosis not present

## 2016-08-16 DIAGNOSIS — K659 Peritonitis, unspecified: Secondary | ICD-10-CM | POA: Diagnosis not present

## 2016-08-16 DIAGNOSIS — D631 Anemia in chronic kidney disease: Secondary | ICD-10-CM | POA: Diagnosis not present

## 2016-08-16 DIAGNOSIS — E8809 Other disorders of plasma-protein metabolism, not elsewhere classified: Secondary | ICD-10-CM | POA: Diagnosis not present

## 2016-08-16 DIAGNOSIS — N2581 Secondary hyperparathyroidism of renal origin: Secondary | ICD-10-CM | POA: Diagnosis not present

## 2016-08-18 DIAGNOSIS — E8809 Other disorders of plasma-protein metabolism, not elsewhere classified: Secondary | ICD-10-CM | POA: Diagnosis not present

## 2016-08-18 DIAGNOSIS — N186 End stage renal disease: Secondary | ICD-10-CM | POA: Diagnosis not present

## 2016-08-18 DIAGNOSIS — K659 Peritonitis, unspecified: Secondary | ICD-10-CM | POA: Diagnosis not present

## 2016-08-18 DIAGNOSIS — N2581 Secondary hyperparathyroidism of renal origin: Secondary | ICD-10-CM | POA: Diagnosis not present

## 2016-08-18 DIAGNOSIS — D631 Anemia in chronic kidney disease: Secondary | ICD-10-CM | POA: Diagnosis not present

## 2016-08-18 DIAGNOSIS — D500 Iron deficiency anemia secondary to blood loss (chronic): Secondary | ICD-10-CM | POA: Diagnosis not present

## 2016-08-19 DIAGNOSIS — K659 Peritonitis, unspecified: Secondary | ICD-10-CM | POA: Diagnosis not present

## 2016-08-19 DIAGNOSIS — D500 Iron deficiency anemia secondary to blood loss (chronic): Secondary | ICD-10-CM | POA: Diagnosis not present

## 2016-08-19 DIAGNOSIS — N186 End stage renal disease: Secondary | ICD-10-CM | POA: Diagnosis not present

## 2016-08-19 DIAGNOSIS — N2581 Secondary hyperparathyroidism of renal origin: Secondary | ICD-10-CM | POA: Diagnosis not present

## 2016-08-19 DIAGNOSIS — E8809 Other disorders of plasma-protein metabolism, not elsewhere classified: Secondary | ICD-10-CM | POA: Diagnosis not present

## 2016-08-19 DIAGNOSIS — D631 Anemia in chronic kidney disease: Secondary | ICD-10-CM | POA: Diagnosis not present

## 2016-08-22 DIAGNOSIS — D631 Anemia in chronic kidney disease: Secondary | ICD-10-CM | POA: Diagnosis not present

## 2016-08-22 DIAGNOSIS — E8809 Other disorders of plasma-protein metabolism, not elsewhere classified: Secondary | ICD-10-CM | POA: Diagnosis not present

## 2016-08-22 DIAGNOSIS — N186 End stage renal disease: Secondary | ICD-10-CM | POA: Diagnosis not present

## 2016-08-22 DIAGNOSIS — N2581 Secondary hyperparathyroidism of renal origin: Secondary | ICD-10-CM | POA: Diagnosis not present

## 2016-08-22 DIAGNOSIS — K659 Peritonitis, unspecified: Secondary | ICD-10-CM | POA: Diagnosis not present

## 2016-08-22 DIAGNOSIS — D500 Iron deficiency anemia secondary to blood loss (chronic): Secondary | ICD-10-CM | POA: Diagnosis not present

## 2016-08-23 DIAGNOSIS — N2581 Secondary hyperparathyroidism of renal origin: Secondary | ICD-10-CM | POA: Diagnosis not present

## 2016-08-23 DIAGNOSIS — D500 Iron deficiency anemia secondary to blood loss (chronic): Secondary | ICD-10-CM | POA: Diagnosis not present

## 2016-08-23 DIAGNOSIS — K659 Peritonitis, unspecified: Secondary | ICD-10-CM | POA: Diagnosis not present

## 2016-08-23 DIAGNOSIS — D631 Anemia in chronic kidney disease: Secondary | ICD-10-CM | POA: Diagnosis not present

## 2016-08-23 DIAGNOSIS — N186 End stage renal disease: Secondary | ICD-10-CM | POA: Diagnosis not present

## 2016-08-23 DIAGNOSIS — E8809 Other disorders of plasma-protein metabolism, not elsewhere classified: Secondary | ICD-10-CM | POA: Diagnosis not present

## 2016-08-25 ENCOUNTER — Encounter: Payer: BLUE CROSS/BLUE SHIELD | Admitting: Vascular Surgery

## 2016-08-25 DIAGNOSIS — D631 Anemia in chronic kidney disease: Secondary | ICD-10-CM | POA: Diagnosis not present

## 2016-08-25 DIAGNOSIS — N186 End stage renal disease: Secondary | ICD-10-CM | POA: Diagnosis not present

## 2016-08-25 DIAGNOSIS — N2581 Secondary hyperparathyroidism of renal origin: Secondary | ICD-10-CM | POA: Diagnosis not present

## 2016-08-25 DIAGNOSIS — K659 Peritonitis, unspecified: Secondary | ICD-10-CM | POA: Diagnosis not present

## 2016-08-25 DIAGNOSIS — D500 Iron deficiency anemia secondary to blood loss (chronic): Secondary | ICD-10-CM | POA: Diagnosis not present

## 2016-08-25 DIAGNOSIS — Z4802 Encounter for removal of sutures: Secondary | ICD-10-CM | POA: Diagnosis not present

## 2016-08-25 DIAGNOSIS — E8809 Other disorders of plasma-protein metabolism, not elsewhere classified: Secondary | ICD-10-CM | POA: Diagnosis not present

## 2016-08-25 DIAGNOSIS — Z4801 Encounter for change or removal of surgical wound dressing: Secondary | ICD-10-CM | POA: Diagnosis not present

## 2016-08-28 DIAGNOSIS — D631 Anemia in chronic kidney disease: Secondary | ICD-10-CM | POA: Diagnosis not present

## 2016-08-28 DIAGNOSIS — D500 Iron deficiency anemia secondary to blood loss (chronic): Secondary | ICD-10-CM | POA: Diagnosis not present

## 2016-08-28 DIAGNOSIS — N186 End stage renal disease: Secondary | ICD-10-CM | POA: Diagnosis not present

## 2016-08-28 DIAGNOSIS — K659 Peritonitis, unspecified: Secondary | ICD-10-CM | POA: Diagnosis not present

## 2016-08-28 DIAGNOSIS — N2581 Secondary hyperparathyroidism of renal origin: Secondary | ICD-10-CM | POA: Diagnosis not present

## 2016-08-28 DIAGNOSIS — E8809 Other disorders of plasma-protein metabolism, not elsewhere classified: Secondary | ICD-10-CM | POA: Diagnosis not present

## 2016-08-30 DIAGNOSIS — D500 Iron deficiency anemia secondary to blood loss (chronic): Secondary | ICD-10-CM | POA: Diagnosis not present

## 2016-08-30 DIAGNOSIS — N2581 Secondary hyperparathyroidism of renal origin: Secondary | ICD-10-CM | POA: Diagnosis not present

## 2016-08-30 DIAGNOSIS — K659 Peritonitis, unspecified: Secondary | ICD-10-CM | POA: Diagnosis not present

## 2016-08-30 DIAGNOSIS — D631 Anemia in chronic kidney disease: Secondary | ICD-10-CM | POA: Diagnosis not present

## 2016-08-30 DIAGNOSIS — E8809 Other disorders of plasma-protein metabolism, not elsewhere classified: Secondary | ICD-10-CM | POA: Diagnosis not present

## 2016-08-30 DIAGNOSIS — N186 End stage renal disease: Secondary | ICD-10-CM | POA: Diagnosis not present

## 2016-09-01 DIAGNOSIS — D631 Anemia in chronic kidney disease: Secondary | ICD-10-CM | POA: Diagnosis not present

## 2016-09-01 DIAGNOSIS — D500 Iron deficiency anemia secondary to blood loss (chronic): Secondary | ICD-10-CM | POA: Diagnosis not present

## 2016-09-01 DIAGNOSIS — K659 Peritonitis, unspecified: Secondary | ICD-10-CM | POA: Diagnosis not present

## 2016-09-01 DIAGNOSIS — N186 End stage renal disease: Secondary | ICD-10-CM | POA: Diagnosis not present

## 2016-09-01 DIAGNOSIS — E8809 Other disorders of plasma-protein metabolism, not elsewhere classified: Secondary | ICD-10-CM | POA: Diagnosis not present

## 2016-09-01 DIAGNOSIS — N2581 Secondary hyperparathyroidism of renal origin: Secondary | ICD-10-CM | POA: Diagnosis not present

## 2016-09-06 ENCOUNTER — Encounter: Payer: Self-pay | Admitting: Physical Medicine & Rehabilitation

## 2016-09-06 ENCOUNTER — Encounter: Payer: Medicare Other | Attending: Physical Medicine & Rehabilitation | Admitting: Physical Medicine & Rehabilitation

## 2016-09-06 VITALS — BP 176/100 | HR 64

## 2016-09-06 DIAGNOSIS — M069 Rheumatoid arthritis, unspecified: Secondary | ICD-10-CM | POA: Diagnosis not present

## 2016-09-06 DIAGNOSIS — N185 Chronic kidney disease, stage 5: Secondary | ICD-10-CM | POA: Insufficient documentation

## 2016-09-06 DIAGNOSIS — M722 Plantar fascial fibromatosis: Secondary | ICD-10-CM

## 2016-09-06 DIAGNOSIS — M1009 Idiopathic gout, multiple sites: Secondary | ICD-10-CM | POA: Diagnosis not present

## 2016-09-06 DIAGNOSIS — Z79899 Other long term (current) drug therapy: Secondary | ICD-10-CM

## 2016-09-06 DIAGNOSIS — N186 End stage renal disease: Secondary | ICD-10-CM | POA: Diagnosis not present

## 2016-09-06 DIAGNOSIS — M792 Neuralgia and neuritis, unspecified: Secondary | ICD-10-CM

## 2016-09-06 DIAGNOSIS — M109 Gout, unspecified: Secondary | ICD-10-CM | POA: Diagnosis not present

## 2016-09-06 DIAGNOSIS — Z5181 Encounter for therapeutic drug level monitoring: Secondary | ICD-10-CM | POA: Diagnosis not present

## 2016-09-06 MED ORDER — OXYCODONE-ACETAMINOPHEN 10-325 MG PO TABS: 1.0000 | ORAL_TABLET | Freq: Every day | ORAL | 0 refills | Status: DC | PRN

## 2016-09-06 MED ORDER — ALPRAZOLAM 0.5 MG PO TABS: 0.5000 mg | ORAL_TABLET | Freq: Two times a day (BID) | ORAL | 2 refills | Status: DC | PRN

## 2016-09-06 MED ORDER — OXYCODONE HCL ER 30 MG PO T12A: 30.0000 mg | EXTENDED_RELEASE_TABLET | Freq: Two times a day (BID) | ORAL | 0 refills | Status: DC

## 2016-09-06 NOTE — Patient Instructions (Signed)
PLEASE FEEL FREE TO CALL OUR OFFICE WITH ANY PROBLEMS OR QUESTIONS ([phone])      

## 2016-09-06 NOTE — Progress Notes (Signed)
Subjective:    Patient ID: James Kim, male    DOB: [birth year], 41 y.o.   MRN: [id number]  HPI   James Kim is here in follow up of his chronic pain. He was in the hospital for an infected PD catheter last month. He is now on HD via an indwelling catheter. He feels that his pain has gradually increased over the last several months. His feet can quite unbearable to walk on. He tries to buy appropriate shoes and wears shoes whenever he walks.   He remains on oxycontin 20mg  q12 and percocet 10/325 q5 prn. He has remained compliant with our program.  He uses lyrica on a PRN basis for radiating jaw pain. It appears to be effective in a low, prn dose    Pain Inventory Average Pain 7 Pain Right Now 9 My pain is sharp, stabbing and aching  In the last 24 hours, has pain interfered with the following? General activity 9 Relation with others 7 Enjoyment of life 10 What TIME of day is your pain at its worst? daytime Sleep (in general) Fair  Pain is worse with: walking, standing and some activites Pain improves with: rest, heat/ice, therapy/exercise, medication and injections Relief from Meds: 5  Mobility walk without assistance ability to climb steps?  yes do you drive?  yes  Function disabled: date disabled . I need assistance with the following:  bathing, household duties and shopping  Neuro/Psych weakness trouble walking anxiety  Prior Studies Any changes since last visit?  no  Physicians involved in your care Any changes since last visit?  no   Family History  Problem Relation Age of Onset  . Stroke Mother   . Diabetes Father    Social History   Social History  . Marital status: Married    Spouse name: N/A  . Number of children: N/A  . Years of education: N/A   Social History Main Topics  . Smoking status: Current Every Day Smoker    Years: 22.00  . Smokeless tobacco: Current User    Types: Snuff  . Alcohol use No  . Drug use: No  . Sexual  activity: Yes   Other Topics Concern  . None   Social History Narrative  . None   Past Surgical History:  Procedure Laterality Date  . CAPD REMOVAL N/A 08/10/2016   Procedure: CONTINUOUS AMBULATORY PERITONEAL DIALYSIS  (CAPD) CATHETER REMOVAL;  Surgeon: 10/10/2016, MD;  Location: [facility] OR;  Service: General;  Laterality: N/A;  . DIALYSIS/PERMA CATHETER INSERTION Right 08/03/2016  . INSERTION OF DIALYSIS CATHETER  ~ 2015   peritoneal dialysis   Past Medical History:  Diagnosis Date  . Anxiety   . Arthritis    "from my knees down" (08/08/2016)  . Childhood asthma   . 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 Dialysis-associated peritonitis (HCC) 08/08/2016  . ESRD on peritoneal dialysis (HCC)    "01/29/2014-08/07/2016" (08/08/2016)  . GERD (gastroesophageal reflux disease)   . Heel spur    "both feet" (08/08/2016)  . Hypertension   . OSA on CPAP   . Pain management    "for pain BLE; knees down thru feet" (08/08/2016)  . Peritoneal dialysis catheter in place (HCC)    BP (!) 176/100   Pulse 64   SpO2 98%   Opioid Risk Score:   Fall Risk Score:  `1  Depression screen PHQ 2/9  Depression screen [facility] 2/9 01/19/2016 08/03/2015 01/25/2015 08/10/2014  Decreased  Interest 1 1 0 2  Down, Depressed, Hopeless 1 1 0 1  PHQ - 2 Score 2 2 0 3  Altered sleeping - 2 - 3  Tired, decreased energy - 1 - 3  Change in appetite - 1 - 3  Feeling bad or failure about yourself  - 0 - 1  Trouble concentrating - 0 - 2  Moving slowly or fidgety/restless - 0 - 0  Suicidal thoughts - 0 - 0  PHQ-9 Score - 6 - 15  Difficult doing work/chores - Not difficult at all - -    Review of Systems  Constitutional: Positive for appetite change.  HENT: Negative.   Eyes: Negative.   Respiratory: Negative.   Cardiovascular: Negative.   Gastrointestinal: Negative.   Endocrine: Negative.   Genitourinary: Positive for difficulty urinating.  Musculoskeletal: Positive for joint swelling.  Skin: Negative.     Allergic/Immunologic: Negative.   Neurological: Negative.   Hematological: Negative.   Psychiatric/Behavioral: Negative.   All other systems reviewed and are negative.      Objective:   Physical Exam  Constitutional: He is oriented to person, place, and time. He appears well-developed and well-nourished. Has lost weight HENT:  Head: Normocephalic and atraumatic.  Neck: Normal range of motion. Neck supple.  Cardiovascular: RRR   Pulmonary/Chest: effort normal.  Musculoskeletal:  Normal Muscle Bulk and Muscle Testing Reveals: Decreased grip due to pain bilaterally. Walks with wide based gait/antalgia.  Bilateral Lower Extremities Flexion Produces Pain into Bilateral Lower Extremities and Bilateral feet Palpable cords in bilateral arches near origin anterior to calcaneus  Neurological: alert and oriented. Motor and sensory stable. Cognitively he's appropriate Skin: Skin is warm and dry.  Psychiatric: he is pleasant as always.   .         Assessment & Plan:  1.Chronic foot pain with history of plantar fasciitis/ Gout Arthropathy: 07/10/2016 Refilled:Oxycodone 10/325 mg one tablet 5x daily as needed #120. Increased Oxycontin to 30mg  one capsule every 12 hours #60. We discussed the fact that we need to really contain any increases in his schedule given his age and the chronicity of the problem. We also reviewed that he will need to take his med a little differently based on his HD schedule.  -Continue Lyrica prn -We will continue the opioid monitoring program, this consists of regular clinic visits, examinations, urine drug screen, pill counts as well as use of Controlled Substance Reporting System. [place] today.  -After informed consent and preparation of the skin with betadine and isopropyl alcohol, I injected 6mg  (1cc) of celestone and 3cc of 1% lidocaine around the bilateral medial arches via inferior approach. Additionally, aspiration was  performed prior to injection. The patient tolerated well, and no complications were encountered. Afterward the area was cleaned and dressed. Post- injection instructions were provided.   2.Chronic Kidney Disease stage V:On HD currently Nephrology Following. 07/10/2016 3. Anxiety: Continue Xanax 0.5 mg BID/prn.  This was RF today  25 minutes of face to face patient care time was spent during this visit. All questions were encouraged and answered.

## 2016-09-07 DIAGNOSIS — D631 Anemia in chronic kidney disease: Secondary | ICD-10-CM | POA: Diagnosis not present

## 2016-09-07 DIAGNOSIS — N2581 Secondary hyperparathyroidism of renal origin: Secondary | ICD-10-CM | POA: Diagnosis not present

## 2016-09-07 DIAGNOSIS — D500 Iron deficiency anemia secondary to blood loss (chronic): Secondary | ICD-10-CM | POA: Diagnosis not present

## 2016-09-07 DIAGNOSIS — E8809 Other disorders of plasma-protein metabolism, not elsewhere classified: Secondary | ICD-10-CM | POA: Diagnosis not present

## 2016-09-07 DIAGNOSIS — N186 End stage renal disease: Secondary | ICD-10-CM | POA: Diagnosis not present

## 2016-09-08 DIAGNOSIS — D500 Iron deficiency anemia secondary to blood loss (chronic): Secondary | ICD-10-CM | POA: Diagnosis not present

## 2016-09-08 DIAGNOSIS — D631 Anemia in chronic kidney disease: Secondary | ICD-10-CM | POA: Diagnosis not present

## 2016-09-08 DIAGNOSIS — N186 End stage renal disease: Secondary | ICD-10-CM | POA: Diagnosis not present

## 2016-09-08 DIAGNOSIS — E8809 Other disorders of plasma-protein metabolism, not elsewhere classified: Secondary | ICD-10-CM | POA: Diagnosis not present

## 2016-09-08 DIAGNOSIS — N2581 Secondary hyperparathyroidism of renal origin: Secondary | ICD-10-CM | POA: Diagnosis not present

## 2016-09-11 DIAGNOSIS — N2581 Secondary hyperparathyroidism of renal origin: Secondary | ICD-10-CM | POA: Diagnosis not present

## 2016-09-11 DIAGNOSIS — D631 Anemia in chronic kidney disease: Secondary | ICD-10-CM | POA: Diagnosis not present

## 2016-09-11 DIAGNOSIS — E8809 Other disorders of plasma-protein metabolism, not elsewhere classified: Secondary | ICD-10-CM | POA: Diagnosis not present

## 2016-09-11 DIAGNOSIS — D500 Iron deficiency anemia secondary to blood loss (chronic): Secondary | ICD-10-CM | POA: Diagnosis not present

## 2016-09-11 DIAGNOSIS — N186 End stage renal disease: Secondary | ICD-10-CM | POA: Diagnosis not present

## 2016-09-13 DIAGNOSIS — D631 Anemia in chronic kidney disease: Secondary | ICD-10-CM | POA: Diagnosis not present

## 2016-09-13 DIAGNOSIS — E8809 Other disorders of plasma-protein metabolism, not elsewhere classified: Secondary | ICD-10-CM | POA: Diagnosis not present

## 2016-09-13 DIAGNOSIS — N186 End stage renal disease: Secondary | ICD-10-CM | POA: Diagnosis not present

## 2016-09-13 DIAGNOSIS — D500 Iron deficiency anemia secondary to blood loss (chronic): Secondary | ICD-10-CM | POA: Diagnosis not present

## 2016-09-13 DIAGNOSIS — N2581 Secondary hyperparathyroidism of renal origin: Secondary | ICD-10-CM | POA: Diagnosis not present

## 2016-09-15 DIAGNOSIS — D631 Anemia in chronic kidney disease: Secondary | ICD-10-CM | POA: Diagnosis not present

## 2016-09-15 DIAGNOSIS — N186 End stage renal disease: Secondary | ICD-10-CM | POA: Diagnosis not present

## 2016-09-15 DIAGNOSIS — E8809 Other disorders of plasma-protein metabolism, not elsewhere classified: Secondary | ICD-10-CM | POA: Diagnosis not present

## 2016-09-15 DIAGNOSIS — D500 Iron deficiency anemia secondary to blood loss (chronic): Secondary | ICD-10-CM | POA: Diagnosis not present

## 2016-09-15 DIAGNOSIS — N2581 Secondary hyperparathyroidism of renal origin: Secondary | ICD-10-CM | POA: Diagnosis not present

## 2016-09-20 DIAGNOSIS — N2581 Secondary hyperparathyroidism of renal origin: Secondary | ICD-10-CM | POA: Diagnosis not present

## 2016-09-20 DIAGNOSIS — E8809 Other disorders of plasma-protein metabolism, not elsewhere classified: Secondary | ICD-10-CM | POA: Diagnosis not present

## 2016-09-20 DIAGNOSIS — D500 Iron deficiency anemia secondary to blood loss (chronic): Secondary | ICD-10-CM | POA: Diagnosis not present

## 2016-09-20 DIAGNOSIS — D631 Anemia in chronic kidney disease: Secondary | ICD-10-CM | POA: Diagnosis not present

## 2016-09-20 DIAGNOSIS — N186 End stage renal disease: Secondary | ICD-10-CM | POA: Diagnosis not present

## 2016-09-22 DIAGNOSIS — N186 End stage renal disease: Secondary | ICD-10-CM | POA: Diagnosis not present

## 2016-09-22 DIAGNOSIS — E8809 Other disorders of plasma-protein metabolism, not elsewhere classified: Secondary | ICD-10-CM | POA: Diagnosis not present

## 2016-09-22 DIAGNOSIS — D631 Anemia in chronic kidney disease: Secondary | ICD-10-CM | POA: Diagnosis not present

## 2016-09-22 DIAGNOSIS — D500 Iron deficiency anemia secondary to blood loss (chronic): Secondary | ICD-10-CM | POA: Diagnosis not present

## 2016-09-22 DIAGNOSIS — N2581 Secondary hyperparathyroidism of renal origin: Secondary | ICD-10-CM | POA: Diagnosis not present

## 2016-09-29 DIAGNOSIS — D631 Anemia in chronic kidney disease: Secondary | ICD-10-CM | POA: Diagnosis not present

## 2016-09-29 DIAGNOSIS — D500 Iron deficiency anemia secondary to blood loss (chronic): Secondary | ICD-10-CM | POA: Diagnosis not present

## 2016-09-29 DIAGNOSIS — N2581 Secondary hyperparathyroidism of renal origin: Secondary | ICD-10-CM | POA: Diagnosis not present

## 2016-09-29 DIAGNOSIS — E8809 Other disorders of plasma-protein metabolism, not elsewhere classified: Secondary | ICD-10-CM | POA: Diagnosis not present

## 2016-09-29 DIAGNOSIS — N186 End stage renal disease: Secondary | ICD-10-CM | POA: Diagnosis not present

## 2016-10-05 ENCOUNTER — Inpatient Hospital Stay (HOSPITAL_COMMUNITY)
Admission: EM | Admit: 2016-10-05 | Discharge: 2016-10-06 | DRG: 682 | Disposition: A | Discharge status: Home / Self Care | Payer: Medicare Other | Attending: Internal Medicine | Admitting: Internal Medicine

## 2016-10-05 ENCOUNTER — Emergency Department (HOSPITAL_COMMUNITY): Payer: Medicare Other

## 2016-10-05 ENCOUNTER — Encounter (HOSPITAL_COMMUNITY): Payer: Self-pay | Admitting: Emergency Medicine

## 2016-10-05 ENCOUNTER — Other Ambulatory Visit: Payer: Self-pay

## 2016-10-05 DIAGNOSIS — Z882 Allergy status to sulfonamides status: Secondary | ICD-10-CM

## 2016-10-05 DIAGNOSIS — J189 Pneumonia, unspecified organism: Secondary | ICD-10-CM

## 2016-10-05 DIAGNOSIS — E875 Hyperkalemia: Secondary | ICD-10-CM | POA: Diagnosis not present

## 2016-10-05 DIAGNOSIS — N2581 Secondary hyperparathyroidism of renal origin: Secondary | ICD-10-CM

## 2016-10-05 DIAGNOSIS — I120 Hypertensive chronic kidney disease with stage 5 chronic kidney disease or end stage renal disease: Secondary | ICD-10-CM | POA: Diagnosis not present

## 2016-10-05 DIAGNOSIS — N186 End stage renal disease: Secondary | ICD-10-CM | POA: Diagnosis not present

## 2016-10-05 DIAGNOSIS — Z9119 Patient's noncompliance with other medical treatment and regimen: Secondary | ICD-10-CM

## 2016-10-05 DIAGNOSIS — D638 Anemia in other chronic diseases classified elsewhere: Secondary | ICD-10-CM

## 2016-10-05 DIAGNOSIS — F17290 Nicotine dependence, other tobacco product, uncomplicated: Secondary | ICD-10-CM | POA: Diagnosis present

## 2016-10-05 DIAGNOSIS — Z91199 Patient's noncompliance with other medical treatment and regimen due to unspecified reason: Secondary | ICD-10-CM

## 2016-10-05 DIAGNOSIS — K219 Gastro-esophageal reflux disease without esophagitis: Secondary | ICD-10-CM | POA: Diagnosis present

## 2016-10-05 DIAGNOSIS — J181 Lobar pneumonia, unspecified organism: Secondary | ICD-10-CM | POA: Diagnosis not present

## 2016-10-05 DIAGNOSIS — I10 Essential (primary) hypertension: Secondary | ICD-10-CM

## 2016-10-05 DIAGNOSIS — Z992 Dependence on renal dialysis: Secondary | ICD-10-CM

## 2016-10-05 DIAGNOSIS — E8770 Fluid overload, unspecified: Secondary | ICD-10-CM

## 2016-10-05 DIAGNOSIS — Z79899 Other long term (current) drug therapy: Secondary | ICD-10-CM

## 2016-10-05 HISTORY — DX: Patient's noncompliance with other medical treatment and regimen due to unspecified reason: Z91.199

## 2016-10-05 HISTORY — DX: Patient's noncompliance with other medical treatment and regimen: Z91.19

## 2016-10-05 LAB — CBC WITH DIFFERENTIAL/PLATELET
BASOS PCT: 0 %
Basophils Absolute: 0 10*3/uL (ref 0.0–0.1)
EOS ABS: 0.2 10*3/uL (ref 0.0–0.7)
Eosinophils Relative: 3 %
HCT: 31.3 % — ABNORMAL LOW (ref 39.0–52.0)
Hemoglobin: 10.2 g/dL — ABNORMAL LOW (ref 13.0–17.0)
Lymphocytes Relative: 20 %
Lymphs Abs: 1.5 10*3/uL (ref 0.7–4.0)
MCH: 29.1 pg (ref 26.0–34.0)
MCHC: 32.6 g/dL (ref 30.0–36.0)
MCV: 89.2 fL (ref 78.0–100.0)
MONO ABS: 0.7 10*3/uL (ref 0.1–1.0)
Monocytes Relative: 9 %
NEUTROS PCT: 68 %
Neutro Abs: 5.1 10*3/uL (ref 1.7–7.7)
PLATELETS: 161 10*3/uL (ref 150–400)
RBC: 3.51 MIL/uL — ABNORMAL LOW (ref 4.22–5.81)
RDW: 17.1 % — ABNORMAL HIGH (ref 11.5–15.5)
WBC: 7.4 10*3/uL (ref 4.0–10.5)

## 2016-10-05 LAB — BASIC METABOLIC PANEL
ANION GAP: 14 (ref 5–15)
BUN: 88 mg/dL — AB (ref 6–20)
CALCIUM: 9 mg/dL (ref 8.9–10.3)
CO2: 16 mmol/L — ABNORMAL LOW (ref 22–32)
CREATININE: 12.59 mg/dL — AB (ref 0.61–1.24)
Chloride: 108 mmol/L (ref 101–111)
GFR calc Af Amer: 5 mL/min — ABNORMAL LOW (ref >60)
GFR, EST NON AFRICAN AMERICAN: 4 mL/min — AB (ref >60)
GLUCOSE: 89 mg/dL (ref 65–99)
Potassium: >7.5 mmol/L (ref 3.5–5.1)
Sodium: 138 mmol/L (ref 135–145)

## 2016-10-05 LAB — CG4 I-STAT (LACTIC ACID): LACTIC ACID, VENOUS: 1.28 mmol/L (ref 0.5–1.9)

## 2016-10-05 MED ORDER — DEXTROSE 50 % IV SOLN
INTRAVENOUS | Status: AC
  Filled 2016-10-05: qty 50

## 2016-10-05 MED ORDER — VANCOMYCIN HCL 10 G IV SOLR
1500.0000 mg | Freq: Once | INTRAVENOUS | Status: AC
  Administered 2016-10-06: 1500 mg via INTRAVENOUS
  Filled 2016-10-05: qty 1500

## 2016-10-05 MED ORDER — INSULIN ASPART 100 UNIT/ML ~~LOC~~ SOLN
10.0000 [IU] | Freq: Once | SUBCUTANEOUS | Status: AC
  Administered 2016-10-05: 10 [IU] via INTRAVENOUS
  Filled 2016-10-05: qty 1

## 2016-10-05 MED ORDER — DEXTROSE 5 % IV SOLN: 2.0000 g | Freq: Once | INTRAVENOUS | Status: DC

## 2016-10-05 MED ORDER — ALBUTEROL (5 MG/ML) CONTINUOUS INHALATION SOLN
10.0000 mg/h | INHALATION_SOLUTION | Freq: Once | RESPIRATORY_TRACT | Status: AC
  Administered 2016-10-05: 10 mg/h via RESPIRATORY_TRACT
  Filled 2016-10-05: qty 20

## 2016-10-05 MED ORDER — DEXTROSE 5 % IV SOLN
2.0000 g | Freq: Once | INTRAVENOUS | Status: AC
  Administered 2016-10-05: 2 g via INTRAVENOUS
  Filled 2016-10-05: qty 2

## 2016-10-05 MED ORDER — SODIUM CHLORIDE 0.9 % IV SOLN
1.0000 g | Freq: Once | INTRAVENOUS | Status: AC
  Administered 2016-10-05: 1 g via INTRAVENOUS
  Filled 2016-10-05: qty 10

## 2016-10-05 MED ORDER — VANCOMYCIN HCL 10 G IV SOLR
INTRAVENOUS | Status: AC
  Filled 2016-10-05: qty 1500

## 2016-10-05 MED ORDER — DEXTROSE 50 % IV SOLN
50.0000 mL | Freq: Once | INTRAVENOUS | Status: AC
  Administered 2016-10-05: 50 mL via INTRAVENOUS

## 2016-10-05 MED ORDER — VANCOMYCIN HCL IN DEXTROSE 1-5 GM/200ML-% IV SOLN
1000.0000 mg | Freq: Once | INTRAVENOUS | Status: DC
  Filled 2016-10-05: qty 200

## 2016-10-05 MED ORDER — SODIUM POLYSTYRENE SULFONATE 15 GM/60ML PO SUSP
50.0000 g | Freq: Once | ORAL | Status: AC
  Administered 2016-10-05: 50 g via ORAL
  Filled 2016-10-05: qty 240

## 2016-10-05 NOTE — ED Triage Notes (Signed)
Pt c/o increased sob and weight gain since missing dialysis all week. Pt's last dialysis was Friday.

## 2016-10-05 NOTE — ED Notes (Signed)
Date and time results received: 10/05/16 2058 (use smartphrase ".now" to insert current time)  Test: potassium Critical Value: >7.5  Name of Provider Notified: dr Clarene Duke  Orders Received? Or Actions Taken?: no orders received.no orders received.

## 2016-10-05 NOTE — Progress Notes (Signed)
Pharmacy Antibiotic Note  James Kim is a 41 y.o. male admitted on 10/05/2016 with pneumonia.  Pharmacy has been consulted for Vancomycin and Cefepime dosing.  Plan: Vancomycin 1500 mg IV x 1 dose Cefepime 2 GM IV x 1 dose F/U dosing according to HD schedule  Height: 5\' 6"  (167.6 cm) Weight: 213 lb (96.6 kg) IBW/kg (Calculated) : 63.8  Temp (24hrs), Avg:98.4 F (36.9 C), Min:98.4 F (36.9 C), Max:98.4 F (36.9 C)   Recent Labs Lab 10/05/16 2004  WBC 7.4  CREATININE 12.59*    Estimated Creatinine Clearance: 8.6 mL/min (A) (by C-G formula based on SCr of 12.59 mg/dL (H)).    Allergies  Allergen Reactions  . Sulfa Antibiotics Anaphylaxis, Itching and Rash    Antimicrobials this admission: Cefepime  5/31 >>  Vancomycin 5/31 >>    Thank you for allowing pharmacy to be a part of this patient's care.  6/31 10/05/2016 9:36 PM

## 2016-10-05 NOTE — ED Provider Notes (Signed)
[facility]-EMERGENCY DEPT Provider Note   CSN: [id number] Arrival date & time: 10/05/16  1856     History   Chief Complaint Chief Complaint  Patient presents with  . Shortness of Breath    HPI KERMIT ARNETTE is a 41 y.o. male.   Shortness of Breath     Pt was seen at 2110. Per pt and his family, c/o gradual onset and persistence of constant "SOB" and "weight gain" over the past 1 week. Pt states he "doesn't like to go to HD" so he did not go this past week. LD was last Friday. Denies CP/palpitations, no cough, no back pain, no fevers, no abd pain, no N/V/D.   Past Medical History:  Diagnosis Date  . Anxiety   . Arthritis    "from my knees down" (08/08/2016)  . Childhood asthma   . 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 Dialysis-associated peritonitis (HCC) 08/08/2016  . ESRD on peritoneal dialysis (HCC)    "01/29/2014-08/07/2016" (08/08/2016)  . GERD (gastroesophageal reflux disease)   . Heel spur    "both feet" (08/08/2016)  . Hypertension   . Noncompliance   . OSA on CPAP   . Pain management    "for pain BLE; knees down thru feet" (08/08/2016)  . Peritoneal dialysis catheter in place [facility])     Patient Active Problem List   Diagnosis Date Noted  . Acute diverticulitis 08/11/2016  . Spontaneous bacterial peritonitis (HCC) 08/10/2016  . Hypokalemia 08/09/2016  . Peritonitis associated with peritoneal dialysis (HCC) 08/09/2016  . Dialysis-associated peritonitis (HCC) 08/08/2016  . Nerve pain 08/30/2015  . Renal dialysis status 02/23/2014  . Obstructive sleep apnea of adult 12/22/2013  . Low serum cobalamin 12/22/2013  . Acid reflux 12/22/2013  . Dyslipidemia 12/22/2013  . Adiposity 12/19/2013  . BP (high blood pressure) 12/19/2013  . Focal and segmental hyalinosis 12/19/2013  . End stage kidney disease (HCC) 12/19/2013  . Chronic kidney disease 04/11/2013  . Gout, arthropathy 03/05/2013  . Rheumatoid arthritis (HCC) 03/05/2013  . Plantar fasciitis,  bilateral 03/05/2013    Past Surgical History:  Procedure Laterality Date  . CAPD REMOVAL N/A 08/10/2016   Procedure: CONTINUOUS AMBULATORY PERITONEAL DIALYSIS  (CAPD) CATHETER REMOVAL;  Surgeon: Claud Kelp, MD;  Location: [facility] OR;  Service: General;  Laterality: N/A;  . DIALYSIS/PERMA CATHETER INSERTION Right 08/03/2016  . INSERTION OF DIALYSIS CATHETER  ~ 2015   peritoneal dialysis       Home Medications    Prior to Admission medications   Medication Sig Start Date End Date Taking? Authorizing Provider  allopurinol (ZYLOPRIM) 100 MG tablet Take 100 mg by mouth daily.    [provider]  ALPRAZolam Prudy Feeler) 0.5 MG tablet Take 1 tablet (0.5 mg total) by mouth 2 (two) times daily as needed for anxiety. 09/06/16   Ranelle Oyster, MD  amLODipine (NORVASC) 10 MG tablet Take 10 mg by mouth every other day.     [provider]  ciprofloxacin (CIPRO) 500 MG tablet Take 1 tablet (500 mg total) by mouth at bedtime. 08/11/16   Renae Fickle, MD  escitalopram (LEXAPRO) 20 MG tablet Take 20 mg by mouth at bedtime.     [provider]  fluconazole (DIFLUCAN) 100 MG tablet Take 100 mg by mouth daily. For 10 days 07/27/16   [provider]  furosemide (LASIX) 20 MG tablet Take 40 mg by mouth 2 (two) times daily as needed for fluid.     [provider]  metoprolol (LOPRESSOR) 50 MG tablet Take 50 mg by mouth See admin instructions. Take 50 mg by mouth twice daily every other day    [provider]  metroNIDAZOLE (FLAGYL) 500 MG tablet Take 1 tablet (500 mg total) by mouth 2 (two) times daily. 08/11/16   Renae Fickle, MD  Multiple Vitamins-Minerals (MULTIVITAMIN PO) Take 1 tablet by mouth daily.    [provider]  oxyCODONE (OXYCONTIN) 30 MG 12 hr tablet Take 30 mg by mouth 2 (two) times daily. 09/06/16   Ranelle Oyster, MD  oxyCODONE-acetaminophen (PERCOCET) 10-325 MG tablet Take 1 tablet by mouth 5 (five) times daily as needed for  pain. 09/06/16   Ranelle Oyster, MD  pantoprazole (PROTONIX) 40 MG tablet Take 40 mg by mouth daily before breakfast.    [provider]  pregabalin (LYRICA) 25 MG capsule Take 1 capsule (25 mg total) by mouth 2 (two) times daily as needed (for fibromyalgia flare-up). 08/11/16   Renae Fickle, MD  sevelamer carbonate (RENVELA) 800 MG tablet Take 2 tablets (1,600 mg total) by mouth 3 (three) times daily with meals. 08/11/16   Renae Fickle, MD  Vitamin D, Ergocalciferol, (DRISDOL) 50000 UNITS CAPS capsule Take 50,000 Units by mouth every Wednesday.     [provider]    Family History Family History  Problem Relation Age of Onset  . Stroke Mother   . Diabetes Father     Social History Social History  Substance Use Topics  . Smoking status: Current Every Day Smoker    Years: 22.00  . Smokeless tobacco: Current User    Types: Snuff  . Alcohol use No     Allergies   Sulfa antibiotics   Review of Systems Review of Systems  Respiratory: Positive for shortness of breath.   ROS: Statement: All systems negative except as marked or noted in the HPI; Constitutional: +weight gain. Negative for fever and chills. ; ; Eyes: Negative for eye pain, redness and discharge. ; ; ENMT: Negative for ear pain, hoarseness, nasal congestion, sinus pressure and sore throat. ; ; Cardiovascular: Negative for chest pain, palpitations, diaphoresis, and +peripheral edema. ; ; Respiratory: +SOB. Negative for cough, wheezing and stridor. ; ; Gastrointestinal: Negative for nausea, vomiting, diarrhea, abdominal pain, blood in stool, hematemesis, jaundice and rectal bleeding. . ; ; Genitourinary: Negative for dysuria, flank pain and hematuria. ; ; Musculoskeletal: Negative for back pain and neck pain. Negative for swelling and trauma.; ; Skin: Negative for pruritus, rash, abrasions, blisters, bruising and skin lesion.; ; Neuro: Negative for headache, lightheadedness and neck stiffness. Negative for  weakness, altered level of consciousness, altered mental status, extremity weakness, paresthesias, involuntary movement, seizure and syncope.        Physical Exam Updated Vital Signs BP (!) 175/111 (BP Location: Left Arm)   Pulse 68   Temp 98.4 F (36.9 C)   Resp 14   Ht 5\' 6"  (1.676 m)   Wt 96.6 kg (213 lb)   SpO2 100%   BMI 34.38 kg/m   Physical Exam 2115: Physical examination:  Nursing notes reviewed; Vital signs and O2 SAT reviewed;  Constitutional: Well developed, Well nourished, Well hydrated, In no acute distress; Head:  Normocephalic, atraumatic; Eyes: EOMI, PERRL, No scleral icterus; ENMT: Mouth and pharynx normal, Mucous membranes moist; Neck: Supple, Full range of motion, No lymphadenopathy; Cardiovascular: Regular rate and rhythm, No gallop; Respiratory: Breath sounds coarse & equal bilaterally, No wheezes.  Speaking full sentences with ease, Normal respiratory  effort/excursion; Chest: Nontender, Movement normal; Abdomen: Soft, Nontender, Nondistended, Normal bowel sounds; Genitourinary: No CVA tenderness; Extremities: Pulses normal, No tenderness, +1 pedal edema bilat. No calf asymmetry.; Neuro: AA&Ox3, Major CN grossly intact.  Speech clear. No gross focal motor or sensory deficits in extremities.; Skin: Color normal, Warm, Dry.   ED Treatments / Results  Labs (all labs ordered are listed, but only abnormal results are displayed)   EKG  EKG Interpretation  Date/Time:  Thursday Oct 05 2016 19:15:50 EDT Ventricular Rate:  74 PR Interval:  180 QRS Duration: 100 QT Interval:  364 QTC Calculation: 404 R Axis:   17 Text Interpretation:  Normal sinus rhythm peaked T-waves When compared with ECG of 11/07/2007 peaked T-waves are present Confirmed by [facility]  MD, Nicholos Johns [phone]) on 10/05/2016 9:29:52 PM       Radiology   Procedures Procedures (including critical care time)  Medications Ordered in ED Medications  calcium gluconate 1 g in sodium chloride 0.9 % 100  mL IVPB (1 g Intravenous New Bag/Given 10/05/16 2205)  ceFEPIme (MAXIPIME) 2 g in dextrose 5 % 50 mL IVPB (not administered)  vancomycin (VANCOCIN) 1,500 mg in sodium chloride 0.9 % 500 mL IVPB (not administered)  albuterol (PROVENTIL,VENTOLIN) solution continuous neb (10 mg/hr Nebulization Given 10/05/16 2128)  dextrose 50 % solution 50 mL (50 mLs Intravenous Given 10/05/16 2204)  insulin aspart (novoLOG) injection 10 Units (10 Units Intravenous Given 10/05/16 2203)  sodium polystyrene (KAYEXALATE) 15 GM/60ML suspension 50 g (50 g Oral Given 10/05/16 2203)     Initial Impression / Assessment and Plan / ED Course  I have reviewed the triage vital signs and the nursing notes.  Pertinent labs & imaging results that were available during my care of the patient were reviewed by me and considered in my medical decision making (see chart for details).  MDM Reviewed: previous chart, nursing note and vitals Reviewed previous: labs and ECG Interpretation: labs, ECG and x-ray Total time providing critical care: 30-74 minutes. This excludes time spent performing separately reportable procedures and services. Consults: admitting MD (Nephrology)    CRITICAL CARE Performed by: Laray Anger Total critical care time: 35 minutes Critical care time was exclusive of separately billable procedures and treating other patients. Critical care was necessary to treat or prevent imminent or life-threatening deterioration. Critical care was time spent personally by me on the following activities: development of treatment plan with patient and/or surrogate as well as nursing, discussions with consultants, evaluation of patient's response to treatment, examination of patient, obtaining history from patient or surrogate, ordering and performing treatments and interventions, ordering and review of laboratory studies, ordering and review of radiographic studies, pulse oximetry and re-evaluation of patient's  condition.   Results for orders placed or performed during the hospital encounter of 10/05/16  CBC with Differential  Result Value Ref Range   WBC 7.4 4.0 - 10.5 K/uL   RBC 3.51 (L) 4.22 - 5.81 MIL/uL   Hemoglobin 10.2 (L) 13.0 - 17.0 g/dL   HCT 24.5 (L) 80.9 - 98.3 %   MCV 89.2 78.0 - 100.0 fL   MCH 29.1 26.0 - 34.0 pg   MCHC 32.6 30.0 - 36.0 g/dL   RDW 38.2 (H) 50.5 - 39.7 %   Platelets 161 150 - 400 K/uL   Neutrophils Relative % 68 %   Neutro Abs 5.1 1.7 - 7.7 K/uL   Lymphocytes Relative 20 %   Lymphs Abs 1.5 0.7 - 4.0 K/uL   Monocytes Relative 9 %  Monocytes Absolute 0.7 0.1 - 1.0 K/uL   Eosinophils Relative 3 %   Eosinophils Absolute 0.2 0.0 - 0.7 K/uL   Basophils Relative 0 %   Basophils Absolute 0.0 0.0 - 0.1 K/uL   WBC Morphology WHITE COUNT CONFIRMED ON SMEAR   Basic metabolic panel  Result Value Ref Range   Sodium 138 135 - 145 mmol/L   Potassium >7.5 (HH) 3.5 - 5.1 mmol/L   Chloride 108 101 - 111 mmol/L   CO2 16 (L) 22 - 32 mmol/L   Glucose, Bld 89 65 - 99 mg/dL   BUN 88 (H) 6 - 20 mg/dL   Creatinine, Ser 76.73 (H) 0.61 - 1.24 mg/dL   Calcium 9.0 8.9 - 41.9 mg/dL   GFR calc non Af Amer 4 (L) >60 mL/min   GFR calc Af Amer 5 (L) >60 mL/min   Anion gap 14 5 - 15  CG4 I-STAT (Lactic acid)  Result Value Ref Range   Lactic Acid, Venous 1.28 0.5 - 1.9 mmol/L   Dg Chest 2 View Result Date: 10/05/2016 CLINICAL DATA:  Productive cough with worsening shortness of breath. EXAM: CHEST  2 VIEW COMPARISON:  10/22/2009 FINDINGS: Patchy airspace disease infrahilar right lung base suspicious for pneumonia. Left lung clear. Cardiopericardial silhouette is at upper limits of normal for size. The visualized bony structures of the thorax are intact. Right IJ dialysis catheter tip position at the SVC/ RA junction. IMPRESSION: Patchy infrahilar airspace disease right lung base suspicious for pneumonia. Electronically Signed   By: Kennith Center M.D.   On: 10/05/2016 20:02    2115:   Potassium elevated 7.5< with peaked T-waves on EKG; IV calcium, IV D50, IV insulin, and albuterol 10mg  neb given.  IV abx given for HCAP.  T/C to [facility] Renal Dr. Lowell Guitar, case discussed, including:  HPI, pertinent PM/SHx, VS/PE, dx testing, ED course and treatment:  Agrees with ED treatment, states there are several pt's on machines already at [facility], give kayexalate 50mg  PO q2h (starting now) to decrease potassium and check labs q2h while awaiting HD, have [facility] to [facility] stepdown and Renal can consult.   2145:  T/C to [facility] Dr. Sharl Ma, case discussed, including:  HPI, pertinent PM/SHx, VS/PE, dx testing, ED course and treatment, as well as d/w Renal MD:  Agreeable to facilitate transfer to [facility] for admission.    Final Clinical Impressions(s) / ED Diagnoses   Final diagnoses:  None    New Prescriptions New Prescriptions   No medications on file      Samuel Jester, DO 10/06/16 2351

## 2016-10-05 NOTE — H&P (Signed)
[facility] H&P    Patient Demographics:    James Kim, is a 41 y.o. male  MRN: [id number]  DOB - 04/21/1976  Admit Date - 10/05/2016  Referring MD/NP/PA: Dr Clarene Duke  Outpatient Primary MD for the patient is Beatriz Stallion, MD  Patient coming from:home  Chief Complaint  Patient presents with  . Shortness of Breath      HPI:    James Kim  is a 41 y.o. male, With history of hypertension, end-stage renal disease on hemodialysis Monday Wednesday Friday came to hospital after he missed appointment for last 2 hemodialysis treatments. Patient's last hematemesis was on Friday and did not go on Monday and Wednesday. Patient's wife says that he is noncompliant. Initially patient was on peritoneal dialysis and recently had peritoneal dialysis catheter removed due to peritonitis. Patient complains of mild shortness of breath. And feels swollen throughout the body. He denies nausea vomiting. No dysuria. He still makes some urine Denies chest pain. In the ED patient was found to be severely hyperkalemic with potassium more than 7.5. Patient received calcium gluconate 1 g IV 1, D50 x 1 ampule, NovoLog 10 units IV 1. Nephrology, Dr. Lowell Guitar was consulted by ED physician Dr. Clarene Duke. He recommended to give patient Kayexalate 50 g by mouth every 2 hours, and check BMP every 2 hours. Transfer patient to [facility] for hemodialysis. Chest x-ray showed patchy infrahilar airspace disease, right lung base. Patient empirically started on vancomycin and cefepime.   Review of systems:      A full 10 point Review of Systems was done, except as stated above, all other Review of Systems were negative.   With Past History of the following :    Past Medical History:  Diagnosis Date  . Anxiety   . Arthritis    "from my knees down" (08/08/2016)  . Childhood asthma   . Chronic kidney disease    Born with kidney  defect  . Dialysis-associated peritonitis (HCC) 08/08/2016  . ESRD on peritoneal dialysis (HCC)    "01/29/2014-08/07/2016" (08/08/2016)  . GERD (gastroesophageal reflux disease)   . Heel spur    "both feet" (08/08/2016)  . Hypertension   . Noncompliance   . OSA on CPAP   . Pain management    "for pain BLE; knees down thru feet" (08/08/2016)  . Peritoneal dialysis catheter in place [facility])       Past Surgical History:  Procedure Laterality Date  . CAPD REMOVAL N/A 08/10/2016   Procedure: CONTINUOUS AMBULATORY PERITONEAL DIALYSIS  (CAPD) CATHETER REMOVAL;  Surgeon: Claud Kelp, MD;  Location: [facility] OR;  Service: General;  Laterality: N/A;  . DIALYSIS/PERMA CATHETER INSERTION Right 08/03/2016  . INSERTION OF DIALYSIS CATHETER  ~ 2015   peritoneal dialysis      Social History:      Social History  Substance Use Topics  . Smoking status: Current Every Day Smoker    Years: 22.00  . Smokeless tobacco: Current User    Types: Snuff  . Alcohol use No  Family History :     Family History  Problem Relation Age of Onset  . Stroke Mother   . Diabetes Father       Home Medications:   Prior to Admission medications   Medication Sig Start Date End Date Taking? Authorizing Provider  allopurinol (ZYLOPRIM) 100 MG tablet Take 100 mg by mouth at bedtime.    Yes [provider]  ALPRAZolam (XANAX) 0.5 MG tablet Take 1 tablet (0.5 mg total) by mouth 2 (two) times daily as needed for anxiety. 09/06/16  Yes Ranelle Oyster, MD  amLODipine (NORVASC) 10 MG tablet Take 10 mg by mouth every other day.    Yes [provider]  escitalopram (LEXAPRO) 20 MG tablet Take 20 mg by mouth at bedtime.    Yes [provider]  furosemide (LASIX) 20 MG tablet Take 40 mg by mouth 2 (two) times daily.    Yes [provider]  metoprolol (LOPRESSOR) 50 MG tablet Take 50 mg by mouth See admin instructions. Take 50 mg by mouth twice daily every other day   Yes [provider]  Multiple Vitamins-Minerals (MULTIVITAMIN PO) Take 1 tablet by mouth daily.   Yes [provider]  oxyCODONE (OXYCONTIN) 30 MG 12 hr tablet Take 30 mg by mouth 2 (two) times daily. 09/06/16  Yes Ranelle Oyster, MD  oxyCODONE-acetaminophen (PERCOCET) 10-325 MG tablet Take 1 tablet by mouth 5 (five) times daily as needed for pain. 09/06/16  Yes Ranelle Oyster, MD  pantoprazole (PROTONIX) 40 MG tablet Take 40 mg by mouth daily before breakfast.   Yes [provider]  pregabalin (LYRICA) 25 MG capsule Take 1 capsule (25 mg total) by mouth 2 (two) times daily as needed (for fibromyalgia flare-up). 08/11/16  Yes Short, Thea Silversmith, MD  sevelamer carbonate (RENVELA) 800 MG tablet Take 2 tablets (1,600 mg total) by mouth 3 (three) times daily with meals. 08/11/16  Yes Short, Thea Silversmith, MD  Vitamin D, Ergocalciferol, (DRISDOL) 50000 UNITS CAPS capsule Take 50,000 Units by mouth every Wednesday.    Yes [provider]  ciprofloxacin (CIPRO) 500 MG tablet Take 1 tablet (500 mg total) by mouth at bedtime. Patient not taking: Reported on 10/05/2016 08/11/16   Renae Fickle, MD  metroNIDAZOLE (FLAGYL) 500 MG tablet Take 1 tablet (500 mg total) by mouth 2 (two) times daily. Patient not taking: Reported on 10/05/2016 08/11/16   Renae Fickle, MD     Allergies:     Allergies  Allergen Reactions  . Sulfa Antibiotics Anaphylaxis, Itching and Rash     Physical Exam:   Vitals  Blood pressure (!) 175/111, pulse 86, temperature 98.4 F (36.9 C), resp. rate 14, height 5\' 6"  (1.676 m), weight 96.6 kg (213 lb), SpO2 100 %.  1.  General: Appears in no acute distress  2. Psychiatric:  Intact judgement and  insight, awake alert, oriented x 3.  3. Neurologic: No focal neurological deficits, all cranial nerves intact.Strength 5/5 all 4 extremities, sensation intact all 4 extremities, plantars down going.  4. Eyes :  anicteric sclerae, moist conjunctivae with no lid lag.  PERRLA.  5. ENMT:  Oropharynx clear with moist mucous membranes and good dentition  6. Neck:  supple, no cervical lymphadenopathy appriciated, No thyromegaly  7. Respiratory : Normal respiratory effort, decreased breath sounds at lung bases  8. Cardiovascular : RRR, no gallops, rubs or murmurs, trace leg edema bilaterally  9. Gastrointestinal:  Positive bowel sounds, abdomen soft, non-tender to palpation,no hepatosplenomegaly, no  rigidity or guarding       10. Skin:  No cyanosis, normal texture and turgor, no rash, lesions or ulcers  11.Musculoskeletal:  Good muscle tone,  joints appear normal , no effusions,  normal range of motion    Data Review:    CBC  Recent Labs Lab 10/05/16 2004  WBC 7.4  HGB 10.2*  HCT 31.3*  PLT 161  MCV 89.2  MCH 29.1  MCHC 32.6  RDW 17.1*  LYMPHSABS 1.5  MONOABS 0.7  EOSABS 0.2  BASOSABS 0.0   ------------------------------------------------------------------------------------------------------------------  Chemistries   Recent Labs Lab 10/05/16 2004  NA 138  K >7.5*  CL 108  CO2 16*  GLUCOSE 89  BUN 88*  CREATININE 12.59*  CALCIUM 9.0   ------------------------------------------------------------------------------------------------------------------  ------------------------------------------------------------------------------------------------------------------ GFR: Estimated Creatinine Clearance: 8.6 mL/min (A) (by C-G formula based on SCr of 12.59 mg/dL (H)). Liver Function Tests: No results for input(s): AST, ALT, ALKPHOS, BILITOT, PROT, ALBUMIN in the last 168 hours. No results for input(s): LIPASE, AMYLASE in the last 168 hours. No results for input(s): AMMONIA in the last 168 hours. Coagulation Profile: No results for input(s): INR, PROTIME in the last 168 hours. Cardiac Enzymes: No results for input(s): CKTOTAL, CKMB, CKMBINDEX, TROPONINI in the last 168 hours. BNP (last 3 results) No results for input(s):  PROBNP in the last 8760 hours. HbA1C: No results for input(s): HGBA1C in the last 72 hours. CBG: No results for input(s): GLUCAP in the last 168 hours. Lipid Profile: No results for input(s): CHOL, HDL, LDLCALC, TRIG, CHOLHDL, LDLDIRECT in the last 72 hours. Thyroid Function Tests: No results for input(s): TSH, T4TOTAL, FREET4, T3FREE, THYROIDAB in the last 72 hours. Anemia Panel: No results for input(s): VITAMINB12, FOLATE, FERRITIN, TIBC, IRON, RETICCTPCT in the last 72 hours.  --------------------------------------------------------------------------------------------------------------- Urine analysis: No results found for: COLORURINE, APPEARANCEUR, LABSPEC, PHURINE, GLUCOSEU, HGBUR, BILIRUBINUR, KETONESUR, PROTEINUR, UROBILINOGEN, NITRITE, LEUKOCYTESUR    Imaging Results:    Dg Chest 2 View  Result Date: 10/05/2016 CLINICAL DATA:  Productive cough with worsening shortness of breath. EXAM: CHEST  2 VIEW COMPARISON:  10/22/2009 FINDINGS: Patchy airspace disease infrahilar right lung base suspicious for pneumonia. Left lung clear. Cardiopericardial silhouette is at upper limits of normal for size. The visualized bony structures of the thorax are intact. Right IJ dialysis catheter tip position at the SVC/ RA junction. IMPRESSION: Patchy infrahilar airspace disease right lung base suspicious for pneumonia. Electronically Signed   By: Kennith Center M.D.   On: 10/05/2016 20:02    My personal review of EKG: Rhythm NSR, peak T waves   Assessment & Plan:    Active Problems:   End stage kidney disease (HCC)   Hyperkalemia   Community acquired pneumonia   Essential hypertension   1. Hyperkalemia-patient has potassium greater than 7.5, EKG shows peaked T waves. He received calcium gluconate, IV insulin, 1 ampule D50, Kayexalate 50 g in the ED. Nephrology was consulted by ED physician, Dr. Lowell Guitar recommends to give 50 g Kayexalate every 2 hours and transfer patient to [facility] for  urgent hemodialysis. Will continue with Kayexalate as above 3 doses, check BMP every 2 hours. 2. End-stage renal disease-patient is on hemodialysis Monday Wednesday Friday, missed 2 dialysis appointments. Will transfer to [facility] for urgent dialysis. 3. Community acquired pneumonia-patient's chest x-ray shows patchy infiltrate in right lung base, started on vancomycin and cefepime. We'll continue with bank and cefepime per pharmacy consultation. Will obtain blood cultures 2. Follow blood culture results. 4.  Hypertension-continue metoprolol 50 g by mouth twice a day, patient takes every other day. Will start hydralazine 25 mg by mouth every 6 hours when necessary for BP more than 160/100.  Patient will be transferred to [facility], Dr.Kakrakandy . has accepted the patient in transfer   DVT Prophylaxis-   Heparin  AM Labs Ordered, also please review Full Orders  Family Communication: Admission, patients condition and plan of care including tests being ordered have been discussed with the patient and his wife at bedside* who indicate understanding and agree with the plan and Code Status.  Code Status:   Full code Admission status:  Observation    Time spent in minutes :  55 minutes   LAMA,GAGAN S M.D on 10/05/2016 at 11:30 PM  Between 7am to 7pm - Pager - [phone]. After 7pm go to [email or web address] - password [facility]  [facility] - Office  [phone]

## 2016-10-06 DIAGNOSIS — E8770 Fluid overload, unspecified: Secondary | ICD-10-CM

## 2016-10-06 DIAGNOSIS — N179 Acute kidney failure, unspecified: Secondary | ICD-10-CM | POA: Diagnosis not present

## 2016-10-06 DIAGNOSIS — I10 Essential (primary) hypertension: Secondary | ICD-10-CM | POA: Diagnosis not present

## 2016-10-06 DIAGNOSIS — D638 Anemia in other chronic diseases classified elsewhere: Secondary | ICD-10-CM | POA: Diagnosis not present

## 2016-10-06 DIAGNOSIS — Z9119 Patient's noncompliance with other medical treatment and regimen: Secondary | ICD-10-CM

## 2016-10-06 DIAGNOSIS — D631 Anemia in chronic kidney disease: Secondary | ICD-10-CM | POA: Diagnosis not present

## 2016-10-06 DIAGNOSIS — Z992 Dependence on renal dialysis: Secondary | ICD-10-CM | POA: Diagnosis not present

## 2016-10-06 DIAGNOSIS — Z882 Allergy status to sulfonamides status: Secondary | ICD-10-CM | POA: Diagnosis not present

## 2016-10-06 DIAGNOSIS — J189 Pneumonia, unspecified organism: Secondary | ICD-10-CM | POA: Diagnosis present

## 2016-10-06 DIAGNOSIS — Z79899 Other long term (current) drug therapy: Secondary | ICD-10-CM | POA: Diagnosis not present

## 2016-10-06 DIAGNOSIS — N186 End stage renal disease: Secondary | ICD-10-CM | POA: Diagnosis not present

## 2016-10-06 DIAGNOSIS — E8779 Other fluid overload: Secondary | ICD-10-CM

## 2016-10-06 DIAGNOSIS — N2581 Secondary hyperparathyroidism of renal origin: Secondary | ICD-10-CM | POA: Diagnosis not present

## 2016-10-06 DIAGNOSIS — I120 Hypertensive chronic kidney disease with stage 5 chronic kidney disease or end stage renal disease: Secondary | ICD-10-CM | POA: Diagnosis present

## 2016-10-06 DIAGNOSIS — E875 Hyperkalemia: Secondary | ICD-10-CM

## 2016-10-06 DIAGNOSIS — Z91199 Patient's noncompliance with other medical treatment and regimen due to unspecified reason: Secondary | ICD-10-CM

## 2016-10-06 DIAGNOSIS — K219 Gastro-esophageal reflux disease without esophagitis: Secondary | ICD-10-CM | POA: Diagnosis present

## 2016-10-06 DIAGNOSIS — F17290 Nicotine dependence, other tobacco product, uncomplicated: Secondary | ICD-10-CM | POA: Diagnosis present

## 2016-10-06 LAB — CBC
HEMATOCRIT: 27.2 % — AB (ref 39.0–52.0)
HEMOGLOBIN: 8.6 g/dL — AB (ref 13.0–17.0)
MCH: 28.3 pg (ref 26.0–34.0)
MCHC: 31.6 g/dL (ref 30.0–36.0)
MCV: 89.5 fL (ref 78.0–100.0)
Platelets: 152 10*3/uL (ref 150–400)
RBC: 3.04 MIL/uL — ABNORMAL LOW (ref 4.22–5.81)
RDW: 17 % — ABNORMAL HIGH (ref 11.5–15.5)
WBC: 6.7 10*3/uL (ref 4.0–10.5)

## 2016-10-06 LAB — BASIC METABOLIC PANEL
Anion gap: 16 — ABNORMAL HIGH (ref 5–15)
Anion gap: 17 — ABNORMAL HIGH (ref 5–15)
BUN: 88 mg/dL — AB (ref 6–20)
BUN: 86 mg/dL — AB (ref 6–20)
CALCIUM: 8.7 mg/dL — AB (ref 8.9–10.3)
CO2: 12 mmol/L — ABNORMAL LOW (ref 22–32)
CO2: 12 mmol/L — ABNORMAL LOW (ref 22–32)
CREATININE: 13.3 mg/dL — AB (ref 0.61–1.24)
CREATININE: 13.5 mg/dL — AB (ref 0.61–1.24)
Calcium: 8.8 mg/dL — ABNORMAL LOW (ref 8.9–10.3)
Chloride: 113 mmol/L — ABNORMAL HIGH (ref 101–111)
Chloride: 111 mmol/L (ref 101–111)
GFR calc Af Amer: 5 mL/min — ABNORMAL LOW (ref >60)
GFR calc Af Amer: 5 mL/min — ABNORMAL LOW (ref >60)
GFR, EST NON AFRICAN AMERICAN: 4 mL/min — AB (ref >60)
GFR, EST NON AFRICAN AMERICAN: 4 mL/min — AB (ref >60)
Glucose, Bld: 120 mg/dL — ABNORMAL HIGH (ref 65–99)
Glucose, Bld: 102 mg/dL — ABNORMAL HIGH (ref 65–99)
Potassium: 6.3 mmol/L (ref 3.5–5.1)
Potassium: 6 mmol/L — ABNORMAL HIGH (ref 3.5–5.1)
SODIUM: 141 mmol/L (ref 135–145)
SODIUM: 140 mmol/L (ref 135–145)

## 2016-10-06 LAB — MRSA PCR SCREENING: MRSA BY PCR: NEGATIVE

## 2016-10-06 MED ORDER — PANTOPRAZOLE SODIUM 40 MG PO TBEC
40.0000 mg | DELAYED_RELEASE_TABLET | Freq: Every day | ORAL | Status: DC
  Administered 2016-10-06 (×2): 40 mg via ORAL
  Filled 2016-10-06 (×2): qty 1

## 2016-10-06 MED ORDER — SEVELAMER CARBONATE 800 MG PO TABS
1600.0000 mg | ORAL_TABLET | Freq: Three times a day (TID) | ORAL | Status: DC
  Administered 2016-10-06 (×2): 1600 mg via ORAL
  Filled 2016-10-06 (×2): qty 2

## 2016-10-06 MED ORDER — PREGABALIN 25 MG PO CAPS
25.0000 mg | ORAL_CAPSULE | Freq: Two times a day (BID) | ORAL | Status: DC | PRN
  Administered 2016-10-06: 25 mg via ORAL
  Filled 2016-10-06: qty 1

## 2016-10-06 MED ORDER — ONDANSETRON HCL 4 MG PO TABS: 4.0000 mg | ORAL_TABLET | Freq: Four times a day (QID) | ORAL | Status: DC | PRN

## 2016-10-06 MED ORDER — SODIUM CHLORIDE 0.9 % IV SOLN: 250.0000 mL | INTRAVENOUS | Status: DC | PRN

## 2016-10-06 MED ORDER — OXYCODONE HCL 5 MG PO TABS
5.0000 mg | ORAL_TABLET | Freq: Every day | ORAL | Status: DC | PRN
Start: 2016-10-06 — End: 2016-10-06
  Administered 2016-10-06 (×2): 5 mg via ORAL
  Filled 2016-10-06 (×2): qty 1

## 2016-10-06 MED ORDER — ONDANSETRON HCL 4 MG/2ML IJ SOLN: 4.0000 mg | Freq: Four times a day (QID) | INTRAMUSCULAR | Status: DC | PRN

## 2016-10-06 MED ORDER — ALPRAZOLAM 0.5 MG PO TABS: 0.5000 mg | ORAL_TABLET | Freq: Two times a day (BID) | ORAL | Status: DC | PRN

## 2016-10-06 MED ORDER — SODIUM CHLORIDE 0.9% FLUSH
3.0000 mL | Freq: Two times a day (BID) | INTRAVENOUS | Status: DC
  Administered 2016-10-06: 3 mL via INTRAVENOUS

## 2016-10-06 MED ORDER — OXYCODONE-ACETAMINOPHEN 10-325 MG PO TABS: 1.0000 | ORAL_TABLET | Freq: Every day | ORAL | Status: DC | PRN

## 2016-10-06 MED ORDER — VITAMIN D (ERGOCALCIFEROL) 1.25 MG (50000 UNIT) PO CAPS: 50000.0000 [IU] | ORAL_CAPSULE | ORAL | Status: DC

## 2016-10-06 MED ORDER — METOPROLOL TARTRATE 50 MG PO TABS: 50.0000 mg | ORAL_TABLET | Freq: Two times a day (BID) | ORAL | Status: DC

## 2016-10-06 MED ORDER — SODIUM POLYSTYRENE SULFONATE 15 GM/60ML PO SUSP
50.0000 g | ORAL | Status: AC
  Administered 2016-10-06 (×3): 50 g via ORAL
  Filled 2016-10-06 (×3): qty 240

## 2016-10-06 MED ORDER — AMLODIPINE BESYLATE 10 MG PO TABS: 10.0000 mg | ORAL_TABLET | ORAL | Status: DC

## 2016-10-06 MED ORDER — OXYCODONE-ACETAMINOPHEN 5-325 MG PO TABS
1.0000 | ORAL_TABLET | Freq: Every day | ORAL | Status: DC | PRN
  Administered 2016-10-06 (×2): 1 via ORAL
  Filled 2016-10-06 (×2): qty 1

## 2016-10-06 MED ORDER — OXYCODONE HCL ER 15 MG PO T12A
30.0000 mg | EXTENDED_RELEASE_TABLET | Freq: Two times a day (BID) | ORAL | Status: DC
  Administered 2016-10-06 (×2): 30 mg via ORAL
  Filled 2016-10-06 (×2): qty 2

## 2016-10-06 MED ORDER — HYDRALAZINE HCL 25 MG PO TABS: 25.0000 mg | ORAL_TABLET | Freq: Four times a day (QID) | ORAL | Status: DC | PRN

## 2016-10-06 MED ORDER — FUROSEMIDE 20 MG PO TABS: 40.0000 mg | ORAL_TABLET | Freq: Two times a day (BID) | ORAL | 2 refills | Status: DC

## 2016-10-06 MED ORDER — AMLODIPINE BESYLATE 10 MG PO TABS: 10.0000 mg | ORAL_TABLET | ORAL | 2 refills | Status: DC

## 2016-10-06 MED ORDER — SODIUM CHLORIDE 0.9% FLUSH: 3.0000 mL | INTRAVENOUS | Status: DC | PRN

## 2016-10-06 MED ORDER — SODIUM POLYSTYRENE SULFONATE 15 GM/60ML PO SUSP: 50.0000 g | ORAL | Status: DC

## 2016-10-06 MED ORDER — HEPARIN SODIUM (PORCINE) 5000 UNIT/ML IJ SOLN
5000.0000 [IU] | Freq: Three times a day (TID) | INTRAMUSCULAR | Status: DC
  Administered 2016-10-06: 5000 [IU] via SUBCUTANEOUS
  Filled 2016-10-06: qty 1

## 2016-10-06 MED ORDER — ESCITALOPRAM OXALATE 20 MG PO TABS
20.0000 mg | ORAL_TABLET | Freq: Every day | ORAL | Status: DC
  Administered 2016-10-06: 20 mg via ORAL
  Filled 2016-10-06: qty 1

## 2016-10-06 NOTE — Addendum Note (Signed)
Addendum  created 10/06/16 1202 by Hollis, Kevin D, MD   Sign clinical note    

## 2016-10-06 NOTE — Progress Notes (Signed)
CRITICAL VALUE ALERT  Critical Value: K+: 6.3  Date & Time Notied: 10/06/16  Provider Notified: Rama  Orders Received/Actions taken: MD notified. Will continue to monitor.

## 2016-10-06 NOTE — Discharge Summary (Signed)
Physician Discharge Summary  James Kim:[id number] DOB: [birth year] DOA: 10/05/2016  PCP: Beatriz Stallion, MD  Admit date: 10/05/2016 Discharge date: 10/06/2016  Admitted From: Home Discharge disposition: Home   Recommendations for Outpatient Follow-Up:   1. Recommend close F/U at [facility].   Discharge Diagnosis:   Principal Problem:   ESRD (end stage renal disease) (HCC) Active Problems:   End stage kidney disease (HCC)   Hyperkalemia   Essential hypertension   Noncompliance   Anemia of chronic disease   Volume overload   Secondary hyperparathyroidism (HCC)   Discharge Condition: Improved.  Diet recommendation: Low sodium, heart healthy.  Renal.    History of Present Illness:   Patient is a 41 year old man with a history of ESRD who missed his last 2 dialysis treatments and has a history of long-standing noncompliance was admitted 10/05/16 with mild shortness of breath and anasarca. Upon initial evaluation in ED, he was hyperkalemic with evidence of volume overload.  Hospital Course by Problem:   Principal Problem:   Volume overload in the setting of ESRD (end stage renal disease) (HCC) and noncompliance with HD Patient was counseled about the importance of attending dialysis regularly. He was dialyzed 10/06/16.  Active Problems:   Hyperkalemia Treated acutely with calcium gluconate, D50, and NovoLog. Also received Kayexalate before been taken to HD.    Essential hypertension Appears to be from missed HD and volume excess.    Anemia of chronic disease ESA resumed.    Secondary hyperparathyroidism (HCC) Phosphate binders per nephrology.   Medical Consultants:    Nephrology   Discharge Exam:   Vitals:   10/06/16 1730 10/06/16 1800  BP: (!) 144/81 (!) 151/102  Pulse: 79 80  Resp:    Temp:     Vitals:   10/06/16 1630 10/06/16 1700 10/06/16 1730 10/06/16 1800  BP: (!) 142/74 130/83 (!) 144/81 (!) 151/102  Pulse: 84 85 79 80  Resp:       Temp:      TempSrc:      SpO2:      Weight:      Height:        General exam: Appears comfortable and calm, no distress. Respiratory system: Bibasilar rales, but respiratory effort not labored.  Cardiovascular system: Regular rate, and rhythm. No murmurs, rubs, or gallops.  Gastrointestinal system: Abdomen is soft, mildly distended, nontender. No masses. Normal bowel sounds.  Central nervous system: Nonfocal exam, alert and oriented. Extremities: 1+ lower extremity edema. Skin: Acne vulgaris to chin and posterior scalp.  Psychiatry: Poor insight/judgment. Mood is depressed.    The results of significant diagnostics from this hospitalization (including imaging, microbiology, ancillary and laboratory) are listed below for reference.     Procedures and Diagnostic Studies:   Dg Chest 2 View  Result Date: 10/05/2016 CLINICAL DATA:  Productive cough with worsening shortness of breath. EXAM: CHEST  2 VIEW COMPARISON:  10/22/2009 FINDINGS: Patchy airspace disease infrahilar right lung base suspicious for pneumonia. Left lung clear. Cardiopericardial silhouette is at upper limits of normal for size. The visualized bony structures of the thorax are intact. Right IJ dialysis catheter tip position at the SVC/ RA junction. IMPRESSION: Patchy infrahilar airspace disease right lung base suspicious for pneumonia. Electronically Signed   By: Kennith Center M.D.   On: 10/05/2016 20:02     Labs:   Basic Metabolic Panel:  Recent Labs Lab 10/05/16 2004 10/06/16 0644 10/06/16 1226  NA 138 141 140  K >7.5* 6.3* 6.0*  CL 108 113* 111  CO2 16* 12* 12*  GLUCOSE 89 120* 102*  BUN 88* 88* 86*  CREATININE 12.59* 13.30* 13.50*  CALCIUM 9.0 8.7* 8.8*   GFR Estimated Creatinine Clearance: 7.7 mL/min (A) (by C-G formula based on SCr of 13.5 mg/dL (H)). Liver Function Tests: No results for input(s): AST, ALT, ALKPHOS, BILITOT, PROT, ALBUMIN in the last 168 hours. No results for input(s): LIPASE,  AMYLASE in the last 168 hours. No results for input(s): AMMONIA in the last 168 hours. Coagulation profile No results for input(s): INR, PROTIME in the last 168 hours.  CBC:  Recent Labs Lab 10/05/16 2004 10/06/16 0644  WBC 7.4 6.7  NEUTROABS 5.1  --   HGB 10.2* 8.6*  HCT 31.3* 27.2*  MCV 89.2 89.5  PLT 161 152   Cardiac Enzymes: No results for input(s): CKTOTAL, CKMB, CKMBINDEX, TROPONINI in the last 168 hours. BNP: Invalid input(s): POCBNP CBG: No results for input(s): GLUCAP in the last 168 hours. D-Dimer No results for input(s): DDIMER in the last 72 hours. Hgb A1c No results for input(s): HGBA1C in the last 72 hours. Lipid Profile No results for input(s): CHOL, HDL, LDLCALC, TRIG, CHOLHDL, LDLDIRECT in the last 72 hours. Thyroid function studies No results for input(s): TSH, T4TOTAL, T3FREE, THYROIDAB in the last 72 hours.  Invalid input(s): FREET3 Anemia work up No results for input(s): VITAMINB12, FOLATE, FERRITIN, TIBC, IRON, RETICCTPCT in the last 72 hours. Microbiology Recent Results (from the past 240 hour(s))  MRSA PCR Screening     Status: None   Collection Time: 10/06/16  3:59 AM  Result Value Ref Range Status   MRSA by PCR NEGATIVE NEGATIVE Final    Comment:        The GeneXpert MRSA Assay (FDA approved for NASAL specimens only), is one component of a comprehensive MRSA colonization surveillance program. It is not intended to diagnose MRSA infection nor to guide or monitor treatment for MRSA infections.      Discharge Instructions:   Discharge Instructions    Call MD for:  difficulty breathing, headache or visual disturbances    Complete by:  As directed    Call MD for:  extreme fatigue    Complete by:  As directed    Call MD for:  persistant dizziness or light-headedness    Complete by:  As directed    Call MD for:  persistant nausea and vomiting    Complete by:  As directed    Diet - low sodium heart healthy    Complete by:  As  directed    Renal   Discharge instructions    Complete by:  As directed    It is very important that you attend dialysis regularly on your scheduled dialysis days.   Increase activity slowly    Complete by:  As directed      Allergies as of 10/06/2016      Reactions   Sulfa Antibiotics Anaphylaxis, Itching, Rash      Medication List    STOP taking these medications   ciprofloxacin 500 MG tablet Commonly known as:  CIPRO   metroNIDAZOLE 500 MG tablet Commonly known as:  FLAGYL     TAKE these medications   allopurinol 100 MG tablet Commonly known as:  ZYLOPRIM Take 100 mg by mouth at bedtime.   ALPRAZolam 0.5 MG tablet Commonly known as:  XANAX Take 1 tablet (0.5 mg total) by mouth 2 (two) times daily as needed for anxiety.   amLODipine 10  MG tablet Commonly known as:  NORVASC Take 1 tablet (10 mg total) by mouth every other day.   escitalopram 20 MG tablet Commonly known as:  LEXAPRO Take 20 mg by mouth at bedtime.   furosemide 20 MG tablet Commonly known as:  LASIX Take 2 tablets (40 mg total) by mouth 2 (two) times daily.   metoprolol tartrate 50 MG tablet Commonly known as:  LOPRESSOR Take 50 mg by mouth See admin instructions. Take 50 mg by mouth twice daily every other day   MULTIVITAMIN PO Take 1 tablet by mouth daily.   oxyCODONE 30 MG 12 hr tablet Commonly known as:  OXYCONTIN Take 30 mg by mouth 2 (two) times daily.   oxyCODONE-acetaminophen 10-325 MG tablet Commonly known as:  PERCOCET Take 1 tablet by mouth 5 (five) times daily as needed for pain.   pantoprazole 40 MG tablet Commonly known as:  PROTONIX Take 40 mg by mouth daily before breakfast.   pregabalin 25 MG capsule Commonly known as:  LYRICA Take 1 capsule (25 mg total) by mouth 2 (two) times daily as needed (for fibromyalgia flare-up).   sevelamer carbonate 800 MG tablet Commonly known as:  RENVELA Take 2 tablets (1,600 mg total) by mouth 3 (three) times daily with meals.     Vitamin D (Ergocalciferol) 50000 units Caps capsule Commonly known as:  DRISDOL Take 50,000 Units by mouth every Wednesday.      Follow-up Information    Beatriz Stallion, MD. Schedule an appointment as soon as possible for a visit in 1 month(s).   Specialty:  Internal Medicine Why:  Routine follow-up           Time coordinating discharge: 25 minutes.  Signed:  RAMA,CHRISTINA  Pager [phone] [facility] 10/06/2016, 6:29 PM

## 2016-10-06 NOTE — Procedures (Signed)
Patient seen on Hemodialysis. QB 400, UF goal 4.5L Treatment adjusted as needed.  Zetta Bills MD [facility]. Office # [phone] Pager # [phone] 2:49 PM

## 2016-10-06 NOTE — Progress Notes (Signed)
Arrival Method: Patient arrived in stretcher from ED. Mental Orientation: oriented Telemetry:26 Assessment: See Doc Flow sheets. Skin: Warm, dry and intact. IV: Peripheral I Pain:.denies Fall Prevention Safety Plan: Patient educated about fall prevention safety plan, understood and acknowledged. Admission Screening:6700 Orientation: Patient has been oriented to the unit, staff and to the room.

## 2016-10-06 NOTE — Consult Note (Signed)
Reason for Consult: Hyperkalemia, continuity of ESRD care Referring Physician: Hillery Aldo M.D. 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 He reports to have previously been on peritoneal dialysis up until about 4 months ago when he started suffering recurrent episodes of peritonitis leading to the removal of his PD catheter and transition to hemodialysis 2 months ago. He reports that he has been struggling to come to terms with that and unfortunately has been missing a few dialysis treatments including earlier this week. He presented to the emergency room with some shortness of breath and a feeling of "swelling all over". He was noted to be hyperkalemic on initial labs with a potassium of >7.5 for which medical measures were instituted. Chest x-ray showed patchy infrahilar airspace disease prompting initiation of antibiotic therapy for HCAP.  He denies any fever or chills and has had some intermittent cough without any sputum production. He denies any nausea, vomiting or diarrhea. He denies any flank pain, fever, chills or dysuria and continues to have some urine output.  Past Medical History:  Diagnosis Date  . Anxiety   . Arthritis    "from my knees down" (08/08/2016)  . Childhood asthma   . 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 Dialysis-associated peritonitis (HCC) 08/08/2016  . ESRD on peritoneal dialysis (HCC)    "01/29/2014-08/07/2016" (08/08/2016)  . GERD (gastroesophageal reflux disease)   . Heel spur    "both feet" (08/08/2016)  . Hypertension   . Noncompliance   . OSA on CPAP   . Pain management    "for pain BLE; knees down thru feet" (08/08/2016)  . Peritoneal dialysis catheter in place [facility])     Past Surgical History:  Procedure Laterality Date  . CAPD REMOVAL N/A 08/10/2016   Procedure: CONTINUOUS AMBULATORY  PERITONEAL DIALYSIS  (CAPD) CATHETER REMOVAL;  Surgeon: Claud Kelp, MD;  Location: [facility] OR;  Service: General;  Laterality: N/A;  . DIALYSIS/PERMA CATHETER INSERTION Right 08/03/2016  . INSERTION OF DIALYSIS CATHETER  ~ 2015   peritoneal dialysis    Family History  Problem Relation Age of Onset  . Stroke Mother   . Diabetes Father     Social History:  reports that he has been smoking.  He has smoked for the past 22.00 years. His smokeless tobacco use includes Snuff. He reports that he does not drink alcohol or use drugs.  Allergies:  Allergies  Allergen Reactions  . Sulfa Antibiotics Anaphylaxis, Itching and Rash    Medications:  Scheduled: . amLODipine  10 mg Oral QODAY  . escitalopram  20 mg Oral QHS  . heparin  5,000 Units Subcutaneous Q8H  . metoprolol tartrate  50 mg Oral BID  . oxyCODONE  30 mg Oral BID  . pantoprazole  40 mg Oral QAC breakfast  . sevelamer carbonate  1,600 mg Oral TID WC  . sodium chloride flush  3 mL Intravenous Q12H  . [START ON 10/11/2016] Vitamin D (Ergocalciferol)  50,000 Units Oral Q Wed    BMP Latest Ref Rng & Units 10/06/2016 10/06/2016 10/05/2016  Glucose 65 - 99 mg/dL 528(U) 132(G) 89  BUN 6 - 20 mg/dL 40(N) 02(V) 25(D)  Creatinine 0.61 - 1.24 mg/dL 66.44(I) 34.74(Q) 59.56(L)  Sodium 135 - 145 mmol/L 140 141 138  Potassium 3.5 - 5.1 mmol/L 6.0(H) 6.3(HH) >7.5(HH)  Chloride 101 - 111 mmol/L  111 113(H) 108  CO2 22 - 32 mmol/L 12(L) 12(L) 16(L)  Calcium 8.9 - 10.3 mg/dL 9.3(T) 7.0(V) 9.0   CBC Latest Ref Rng & Units 10/06/2016 10/05/2016 08/11/2016  WBC 4.0 - 10.5 K/uL 6.7 7.4 7.8  Hemoglobin 13.0 - 17.0 g/dL 7.7(L) 10.2(L) 9.7(L)  Hematocrit 39.0 - 52.0 % 27.2(L) 31.3(L) 29.5(L)  Platelets 150 - 400 K/uL 152 161 186     Dg Chest 2 View  Result Date: 10/05/2016 CLINICAL DATA:  Productive cough with worsening shortness of breath. EXAM: CHEST  2 VIEW COMPARISON:  10/22/2009 FINDINGS: Patchy airspace disease infrahilar right lung base suspicious  for pneumonia. Left lung clear. Cardiopericardial silhouette is at upper limits of normal for size. The visualized bony structures of the thorax are intact. Right IJ dialysis catheter tip position at the SVC/ RA junction. IMPRESSION: Patchy infrahilar airspace disease right lung base suspicious for pneumonia. Electronically Signed   By: Kennith Center M.D.   On: 10/05/2016 20:02    ROS Blood pressure (!) 160/93, pulse 98, temperature 98.6 F (37 C), temperature source Oral, resp. rate 18, height 5\' 6"  (1.676 m), weight 94.3 kg (207 lb 14.3 oz), SpO2 99 %. Physical Exam  Nursing note and vitals reviewed. Constitutional: He is oriented to person, place, and time. He appears well-developed and well-nourished.  HENT:  Head: Normocephalic and atraumatic.  Nose: Nose normal.  Mouth/Throat: Oropharynx is clear and moist.  Eyes: EOM are normal. Pupils are equal, round, and reactive to light. No scleral icterus.  Neck: Normal range of motion. Neck supple. JVD present. No tracheal deviation present.  Cardiovascular: Normal rate, regular rhythm and normal heart sounds.   Respiratory: Effort normal. He has no wheezes. He has rales.  Right IJ TDC  GI: Soft. Bowel sounds are normal. He exhibits distension. There is no tenderness. There is no rebound.  Musculoskeletal: Normal range of motion. He exhibits edema.  1+ bilateral lower extremity edema  Neurological: He is alert and oriented to person, place, and time.  Skin: Skin is warm and dry. No rash noted. No erythema.    Assessment/Plan: 1. Hyperkalemia: Secondary to missed hemodialysis treatments-appears to have been treated quite aggressively medically with appropriate lowering of potassium. To undergo hemodialysis as well this afternoon. 2. End-stage renal disease: Hemodialysis today for volume management/clearance/hyperkalemia management. We discussed compliance at length and it is apparent that he needs additional help from a mental health  standpoint to help adjust his antidepressant medication. 3. Hypertension: Most likely secondary to missed hemodialysis treatment/volume excess superimposed on essential hypertension. Monitor with hemodialysis/ultrafiltration. 4. Anemia of chronic kidney disease: Low hemoglobin level noted, check iron studies today with dialysis and restart ESA. 5. Volume overload: Hemodialysis today for ultrafiltration. Reiterated adherence with dialysis and limiting intradialytic weight gain to < 1.2 L 6. Secondary hyperparathyroidism: Resume phosphorus binders and obtain records regarding VDRA.  Taren Toops K. 10/06/2016, 2:25 PM

## 2016-10-06 NOTE — Anesthesia Postprocedure Evaluation (Signed)
Anesthesia Post Note  Patient: James Kim  Procedure(s) Performed: Procedure(s) (LRB): CONTINUOUS AMBULATORY PERITONEAL DIALYSIS  (CAPD) CATHETER REMOVAL (N/A)     Anesthesia Post Evaluation  Last Vitals:  Vitals:   08/11/16 0736 08/11/16 0955  BP: 138/88 125/70  Pulse: 85   Resp: 16   Temp: 37.2 C     Last Pain:  Vitals:   08/11/16 1337  TempSrc:   PainSc: 0-No pain                 Shelton Silvas

## 2016-10-06 NOTE — Care Management Obs Status (Signed)
MEDICARE OBSERVATION STATUS NOTIFICATION   Patient Details  Name: James Kim MRN: [id number] Date of Birth: [birth year]   Medicare Observation Status Notification Given:  Yes    Placido Hangartner, Annamarie Major, RN 10/06/2016, 1:26 PM

## 2016-10-06 NOTE — Progress Notes (Signed)
Discharge papers and instructions given to patient and patient's wife,verbalized understanding. Patient and wife made aware of follow up appointments with MD. Discussed with patient about importance of keeping up with dialysis appointments.Patient verbalized understanding. Geneva Pallas, Drinda Butts, Charity fundraiser

## 2016-10-09 ENCOUNTER — Encounter: Payer: Medicare Other | Attending: Physical Medicine & Rehabilitation | Admitting: Registered Nurse

## 2016-10-09 ENCOUNTER — Encounter: Payer: Self-pay | Admitting: Registered Nurse

## 2016-10-09 VITALS — BP 161/94 | HR 73 | Resp 14

## 2016-10-09 DIAGNOSIS — G894 Chronic pain syndrome: Secondary | ICD-10-CM | POA: Diagnosis not present

## 2016-10-09 DIAGNOSIS — N185 Chronic kidney disease, stage 5: Secondary | ICD-10-CM | POA: Insufficient documentation

## 2016-10-09 DIAGNOSIS — D6959 Other secondary thrombocytopenia: Secondary | ICD-10-CM | POA: Diagnosis not present

## 2016-10-09 DIAGNOSIS — Z79899 Other long term (current) drug therapy: Secondary | ICD-10-CM | POA: Diagnosis not present

## 2016-10-09 DIAGNOSIS — E8809 Other disorders of plasma-protein metabolism, not elsewhere classified: Secondary | ICD-10-CM | POA: Diagnosis not present

## 2016-10-09 DIAGNOSIS — D631 Anemia in chronic kidney disease: Secondary | ICD-10-CM | POA: Diagnosis not present

## 2016-10-09 DIAGNOSIS — N2581 Secondary hyperparathyroidism of renal origin: Secondary | ICD-10-CM | POA: Diagnosis not present

## 2016-10-09 DIAGNOSIS — E8770 Fluid overload, unspecified: Secondary | ICD-10-CM | POA: Diagnosis not present

## 2016-10-09 DIAGNOSIS — M1009 Idiopathic gout, multiple sites: Secondary | ICD-10-CM | POA: Insufficient documentation

## 2016-10-09 DIAGNOSIS — M069 Rheumatoid arthritis, unspecified: Secondary | ICD-10-CM | POA: Diagnosis not present

## 2016-10-09 DIAGNOSIS — M109 Gout, unspecified: Secondary | ICD-10-CM

## 2016-10-09 DIAGNOSIS — R5081 Fever presenting with conditions classified elsewhere: Secondary | ICD-10-CM | POA: Diagnosis not present

## 2016-10-09 DIAGNOSIS — N186 End stage renal disease: Secondary | ICD-10-CM | POA: Diagnosis not present

## 2016-10-09 DIAGNOSIS — Z5181 Encounter for therapeutic drug level monitoring: Secondary | ICD-10-CM

## 2016-10-09 DIAGNOSIS — D500 Iron deficiency anemia secondary to blood loss (chronic): Secondary | ICD-10-CM | POA: Diagnosis not present

## 2016-10-09 DIAGNOSIS — M722 Plantar fascial fibromatosis: Secondary | ICD-10-CM

## 2016-10-09 DIAGNOSIS — R7881 Bacteremia: Secondary | ICD-10-CM | POA: Diagnosis not present

## 2016-10-09 MED ORDER — OXYCODONE-ACETAMINOPHEN 10-325 MG PO TABS: 1.0000 | ORAL_TABLET | Freq: Every day | ORAL | 0 refills | Status: DC | PRN

## 2016-10-09 MED ORDER — OXYCODONE HCL ER 30 MG PO T12A: 30.0000 mg | EXTENDED_RELEASE_TABLET | Freq: Two times a day (BID) | ORAL | 0 refills | Status: DC

## 2016-10-09 NOTE — Progress Notes (Signed)
Subjective:    Patient ID: James Kim, male    DOB: 25-Mar-1976, 41 y.o.   MRN: [id number]  HPI:  James Kim is a 41 year old male who returns for follow up appointmentfor chronic pain and medication refill. He states his pain is located in his bilateral lower extremities and bilateral feet. He rates his pain 9.His current exercise regime is walking.  James Kim was hospitalized at [facility] from 10/05/2016 and discharged 10/06/2016. He was hospitalized for Hyperkalemia, he missed two dialysis sessions. Educated on compliance, he verbalizes understanding. States had death in the family and tried calling his dialysis unit for change in schedule and they were unable to accommodate. He realizes and verbalizes the importance of being compliant.  UDS Swab ordered today.   Wife in room all questions answered.    Pain Inventory Average Pain 7 Pain Right Now 9 My pain is sharp, stabbing and aching  In the last 24 hours, has pain interfered with the following? General activity 7 Relation with others 7 Enjoyment of life 10 What TIME of day is your pain at its worst? all Sleep (in general) Poor  Pain is worse with: walking, bending, standing and some activites Pain improves with: rest, heat/ice, medication and injections Relief from Meds: 7  Mobility walk without assistance use a cane how many minutes can you walk? 10-15 ability to climb steps?  yes do you drive?  yes Do you have any goals in this area?  yes  Function disabled: date disabled n/a I need assistance with the following:  bathing and household duties Do you have any goals in this area?  yes  Neuro/Psych trouble walking anxiety  Prior Studies Any changes since last visit?  yes  Physicians involved in your care Any changes since last visit?  yes   Family History  Problem Relation Age of Onset  . Stroke Mother   . Diabetes Father    Social History   Social History  . Marital status:  Married    Spouse name: N/A  . Number of children: N/A  . Years of education: N/A   Social History Main Topics  . Smoking status: Current Every Day Smoker    Years: 22.00  . Smokeless tobacco: Current User    Types: Snuff  . Alcohol use No  . Drug use: No  . Sexual activity: Yes   Other Topics Concern  . Not on file   Social History Narrative  . No narrative on file   Past Surgical History:  Procedure Laterality Date  . CAPD REMOVAL N/A 08/10/2016   Procedure: CONTINUOUS AMBULATORY PERITONEAL DIALYSIS  (CAPD) CATHETER REMOVAL;  Surgeon: Claud Kelp, MD;  Location: [facility] OR;  Service: General;  Laterality: N/A;  . DIALYSIS/PERMA CATHETER INSERTION Right 08/03/2016  . INSERTION OF DIALYSIS CATHETER  ~ 2015   peritoneal dialysis   Past Medical History:  Diagnosis Date  . Anxiety   . Arthritis    "from my knees down" (08/08/2016)  . Childhood asthma   . 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 Dialysis-associated peritonitis (HCC) 08/08/2016  . ESRD on peritoneal dialysis (HCC)    "01/29/2014-08/07/2016" (08/08/2016)  . GERD (gastroesophageal reflux disease)   . Heel spur    "both feet" (08/08/2016)  . Hypertension   . Noncompliance   . OSA on CPAP   . Pain management    "for pain BLE; knees down thru feet" (08/08/2016)  . Peritoneal  dialysis catheter in place [facility])    There were no vitals taken for this visit.  Opioid Risk Score:   Fall Risk Score:  `1  Depression screen PHQ 2/9  Depression screen [facility] 2/9 01/19/2016 08/03/2015 01/25/2015 08/10/2014  Decreased Interest 1 1 0 2  Down, Depressed, Hopeless 1 1 0 1  PHQ - 2 Score 2 2 0 3  Altered sleeping - 2 - 3  Tired, decreased energy - 1 - 3  Change in appetite - 1 - 3  Feeling bad or failure about yourself  - 0 - 1  Trouble concentrating - 0 - 2  Moving slowly or fidgety/restless - 0 - 0  Suicidal thoughts - 0 - 0  PHQ-9 Score - 6 - 15  Difficult doing work/chores - Not difficult at all - -      Review  of Systems  Constitutional: Positive for appetite change, chills and fever.  HENT: Negative.   Eyes: Negative.   Respiratory: Positive for apnea and shortness of breath.   Cardiovascular: Negative.   Gastrointestinal: Negative.   Endocrine: Negative.   Genitourinary: Positive for decreased urine volume.  Musculoskeletal: Positive for joint swelling.  Skin: Negative.   Allergic/Immunologic: Negative.   Neurological: Negative.   Hematological: Negative.   Psychiatric/Behavioral: Negative.   All other systems reviewed and are negative.      Objective:   Physical Exam  Constitutional: He is oriented to person, place, and time. He appears well-developed and well-nourished.  HENT:  Head: Normocephalic and atraumatic.  Neck: Normal range of motion. Neck supple.  Cardiovascular: Normal rate and regular rhythm.   Pulmonary/Chest: Effort normal and breath sounds normal.  Musculoskeletal:  Normal Muscle Bulk and Muscle Testing Reveals: Upper Extremities: Full ROM and Muscle Strength 5/5 Lower Extremities: Full ROM and Muscle Strength 5/5 Bilateral Lower Extremities Flexion Produces Pain into Extremities and Bilateral Feet Arises from Table Slowly Antalgic gait  Neurological: He is alert and oriented to person, place, and time.  Skin: Skin is warm and dry.  Psychiatric: He has a normal mood and affect.  Nursing note and vitals reviewed.         Assessment & Plan:  1.Chronic foot pain with history of plantar fasciitis/ Gout Arthropathy: 10/09/2016 Refilled:Oxycodone 10/325 mg one tablet 5 times a day as needed #150 and Oxycontin 30mg  one capsule every 12 hours #60. Continue Lyrica. We will continue the opioid monitoring program this consists of, regular clinic visits, examinations, urine drug screen, pill counts as well as use of Controlled Substance Reporting System. 2.Chronic Kidney Disease stage V:On Hemo- Dialysis/ Nephrology Following. 10/09/2016 3. Anxiety:  Continue Xanax 0.5 mg BID/prn. 10/09/2016  20  minutes of face to face patient care time was spent during this visit. All questions were encouraged and answered.  F/U in 1 month

## 2016-10-10 DIAGNOSIS — N2581 Secondary hyperparathyroidism of renal origin: Secondary | ICD-10-CM | POA: Diagnosis not present

## 2016-10-10 DIAGNOSIS — D500 Iron deficiency anemia secondary to blood loss (chronic): Secondary | ICD-10-CM | POA: Diagnosis not present

## 2016-10-10 DIAGNOSIS — R5081 Fever presenting with conditions classified elsewhere: Secondary | ICD-10-CM | POA: Diagnosis not present

## 2016-10-10 DIAGNOSIS — R7881 Bacteremia: Secondary | ICD-10-CM | POA: Diagnosis not present

## 2016-10-10 DIAGNOSIS — D631 Anemia in chronic kidney disease: Secondary | ICD-10-CM | POA: Diagnosis not present

## 2016-10-10 DIAGNOSIS — N186 End stage renal disease: Secondary | ICD-10-CM | POA: Diagnosis not present

## 2016-10-11 ENCOUNTER — Telehealth: Payer: Self-pay | Admitting: Registered Nurse

## 2016-10-11 DIAGNOSIS — D631 Anemia in chronic kidney disease: Secondary | ICD-10-CM | POA: Diagnosis not present

## 2016-10-11 DIAGNOSIS — D500 Iron deficiency anemia secondary to blood loss (chronic): Secondary | ICD-10-CM | POA: Diagnosis not present

## 2016-10-11 DIAGNOSIS — N2581 Secondary hyperparathyroidism of renal origin: Secondary | ICD-10-CM | POA: Diagnosis not present

## 2016-10-11 DIAGNOSIS — N186 End stage renal disease: Secondary | ICD-10-CM | POA: Diagnosis not present

## 2016-10-11 DIAGNOSIS — R5081 Fever presenting with conditions classified elsewhere: Secondary | ICD-10-CM | POA: Diagnosis not present

## 2016-10-11 DIAGNOSIS — R7881 Bacteremia: Secondary | ICD-10-CM | POA: Diagnosis not present

## 2016-10-11 NOTE — Telephone Encounter (Signed)
Placed a call to [facility] to verify [facility]. Reviewed NCCSR, 2018 data not listed.  Placed a call to James Kim, spoke to Pharmacist. James Kim birthday is listed as 10/13/1975.   On 10/11/2016 the  NCCSR was reviewed no conflict was seen on the [facility] Reporting System with multiple prescribers. James Kim has a signed narcotic contract with our office. If there were any discrepancies this would have been reported to James Kim physician.

## 2016-10-12 ENCOUNTER — Encounter: Payer: Self-pay | Admitting: Vascular Surgery

## 2016-10-12 DIAGNOSIS — Z01810 Encounter for preprocedural cardiovascular examination: Secondary | ICD-10-CM | POA: Diagnosis not present

## 2016-10-12 DIAGNOSIS — R9431 Abnormal electrocardiogram [ECG] [EKG]: Secondary | ICD-10-CM | POA: Diagnosis not present

## 2016-10-12 DIAGNOSIS — E782 Mixed hyperlipidemia: Secondary | ICD-10-CM | POA: Diagnosis not present

## 2016-10-12 DIAGNOSIS — I10 Essential (primary) hypertension: Secondary | ICD-10-CM | POA: Diagnosis not present

## 2016-10-13 DIAGNOSIS — R5081 Fever presenting with conditions classified elsewhere: Secondary | ICD-10-CM | POA: Diagnosis not present

## 2016-10-13 DIAGNOSIS — D631 Anemia in chronic kidney disease: Secondary | ICD-10-CM | POA: Diagnosis not present

## 2016-10-13 DIAGNOSIS — N186 End stage renal disease: Secondary | ICD-10-CM | POA: Diagnosis not present

## 2016-10-13 DIAGNOSIS — R7881 Bacteremia: Secondary | ICD-10-CM | POA: Diagnosis not present

## 2016-10-13 DIAGNOSIS — D500 Iron deficiency anemia secondary to blood loss (chronic): Secondary | ICD-10-CM | POA: Diagnosis not present

## 2016-10-13 DIAGNOSIS — N2581 Secondary hyperparathyroidism of renal origin: Secondary | ICD-10-CM | POA: Diagnosis not present

## 2016-10-16 DIAGNOSIS — D631 Anemia in chronic kidney disease: Secondary | ICD-10-CM | POA: Diagnosis not present

## 2016-10-16 DIAGNOSIS — D500 Iron deficiency anemia secondary to blood loss (chronic): Secondary | ICD-10-CM | POA: Diagnosis not present

## 2016-10-16 DIAGNOSIS — N2581 Secondary hyperparathyroidism of renal origin: Secondary | ICD-10-CM | POA: Diagnosis not present

## 2016-10-16 DIAGNOSIS — R7881 Bacteremia: Secondary | ICD-10-CM | POA: Diagnosis not present

## 2016-10-16 DIAGNOSIS — N186 End stage renal disease: Secondary | ICD-10-CM | POA: Diagnosis not present

## 2016-10-16 DIAGNOSIS — R5081 Fever presenting with conditions classified elsewhere: Secondary | ICD-10-CM | POA: Diagnosis not present

## 2016-10-17 DIAGNOSIS — I340 Nonrheumatic mitral (valve) insufficiency: Secondary | ICD-10-CM | POA: Diagnosis not present

## 2016-10-17 LAB — DRUG TOX MONITOR 1 W/CONF, ORAL FLD
ALPRAZOLAM: 4.44 ng/mL — AB (ref <0.50)
Amphetamines: NEGATIVE ng/mL (ref <10)
BUPRENORPHINE: NEGATIVE ng/mL (ref <0.025)
Barbiturates: NEGATIVE ng/mL (ref <10)
Benzodiazepines: POSITIVE ng/mL — AB (ref <0.50)
CHLORDIAZEPOXIDE: NEGATIVE ng/mL (ref <0.50)
CLONAZEPAM: NEGATIVE ng/mL (ref <0.50)
CODEINE: NEGATIVE ng/mL (ref <2.5)
Cocaine: NEGATIVE ng/mL (ref <2.5)
Cotinine: >250 ng/mL — ABNORMAL HIGH (ref <5.0)
DIAZEPAM: NEGATIVE ng/mL (ref <0.50)
Dihydrocodeine: NEGATIVE ng/mL (ref <2.5)
FLURAZEPAM: NEGATIVE ng/mL (ref <0.50)
Fentanyl: NEGATIVE ng/mL (ref <0.10)
Flunitrazepam: NEGATIVE ng/mL (ref <0.50)
HEROIN METABOLITE: NEGATIVE ng/mL (ref <1.0)
HYDROCODONE: NEGATIVE ng/mL (ref <2.5)
HYDROMORPHONE: NEGATIVE ng/mL (ref <2.5)
Lorazepam: NEGATIVE ng/mL (ref <0.50)
MARIJUANA: NEGATIVE ng/mL (ref <2.5)
MDMA: NEGATIVE ng/mL (ref <10)
METHADONE: NEGATIVE ng/mL (ref <5.0)
MORPHINE: NEGATIVE ng/mL (ref <2.5)
Meperidine: NEGATIVE ng/mL (ref <5.0)
Meprobamate: NEGATIVE ng/mL (ref <2.5)
Midazolam: NEGATIVE ng/mL (ref <0.50)
NOROXYCODONE: 6.8 ng/mL — AB (ref <2.5)
Nicotine Metabolite: POSITIVE ng/mL — AB (ref <5.0)
Nordiazepam: NEGATIVE ng/mL (ref <0.50)
Norhydrocodone: NEGATIVE ng/mL (ref <2.5)
Opiates: POSITIVE ng/mL — AB (ref <2.5)
Oxazepam: NEGATIVE ng/mL (ref <0.50)
Oxycodone: 3.4 ng/mL — ABNORMAL HIGH (ref <2.5)
Oxymorphone: NEGATIVE ng/mL (ref <2.5)
Phencyclidine: NEGATIVE ng/mL (ref <10)
Propoxyphene: NEGATIVE ng/mL (ref <5.0)
TAPENTADOL: NEGATIVE ng/mL (ref <5.0)
TEMAZEPAM: NEGATIVE ng/mL (ref <0.50)
TRIAZOLAM: NEGATIVE ng/mL (ref <0.50)
Tramadol: NEGATIVE ng/mL (ref <5.0)
Zolpidem: NEGATIVE ng/mL (ref <5.0)

## 2016-10-17 LAB — DRUG TOX ALC METAB W/CON, ORAL FLD: Alcohol Metabolite: NEGATIVE ng/mL (ref <25)

## 2016-10-17 LAB — DRUG TOX METHYLPHEN W/CONF,ORAL FLD: Methylphenidate: NEGATIVE ng/mL (ref <1.0)

## 2016-10-18 ENCOUNTER — Telehealth: Payer: Self-pay | Admitting: *Deleted

## 2016-10-18 DIAGNOSIS — R7881 Bacteremia: Secondary | ICD-10-CM | POA: Diagnosis not present

## 2016-10-18 DIAGNOSIS — N186 End stage renal disease: Secondary | ICD-10-CM | POA: Diagnosis not present

## 2016-10-18 DIAGNOSIS — D500 Iron deficiency anemia secondary to blood loss (chronic): Secondary | ICD-10-CM | POA: Diagnosis not present

## 2016-10-18 DIAGNOSIS — N2581 Secondary hyperparathyroidism of renal origin: Secondary | ICD-10-CM | POA: Diagnosis not present

## 2016-10-18 DIAGNOSIS — D631 Anemia in chronic kidney disease: Secondary | ICD-10-CM | POA: Diagnosis not present

## 2016-10-18 DIAGNOSIS — R5081 Fever presenting with conditions classified elsewhere: Secondary | ICD-10-CM | POA: Diagnosis not present

## 2016-10-18 NOTE — Telephone Encounter (Signed)
Oral swab drug screen is consistent for prescribed medication.

## 2016-10-20 ENCOUNTER — Encounter: Payer: Self-pay | Admitting: Vascular Surgery

## 2016-10-20 ENCOUNTER — Ambulatory Visit (INDEPENDENT_AMBULATORY_CARE_PROVIDER_SITE_OTHER): Payer: BLUE CROSS/BLUE SHIELD | Admitting: Vascular Surgery

## 2016-10-20 ENCOUNTER — Encounter: Payer: BLUE CROSS/BLUE SHIELD | Admitting: Vascular Surgery

## 2016-10-20 ENCOUNTER — Other Ambulatory Visit: Payer: Self-pay

## 2016-10-20 VITALS — BP 150/86 | HR 83 | Temp 98.2°F | Resp 20 | Ht 66.0 in | Wt 197.4 lb

## 2016-10-20 DIAGNOSIS — N186 End stage renal disease: Secondary | ICD-10-CM | POA: Diagnosis not present

## 2016-10-20 DIAGNOSIS — Z48812 Encounter for surgical aftercare following surgery on the circulatory system: Secondary | ICD-10-CM

## 2016-10-20 NOTE — Progress Notes (Signed)
[facility]   HISTORY AND PHYSICAL   History of Present Illness:    James Kim is a 41 y.o. (1975/11/02) male  who presents for placement of a permanent hemodialysis access.  He has used peritoneal dialysis for the past 3 years until he got an infection.  The patient is right handed.  The patient is currently on hemodialysis via right IJ  M-W-F.  The cause of renal failure is thought to be secondary to HTN and birth defect of one functioning kidney.  Other chronic medical problems include HTN, gout, RA, Dyslipidemia.  Past Medical History:  Diagnosis Date  . Anxiety   . Arthritis    "from my knees down" (08/08/2016)  . Childhood asthma   . 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 Dialysis-associated peritonitis (HCC) 08/08/2016  . ESRD on peritoneal dialysis (HCC)    "01/29/2014-08/07/2016" (08/08/2016)  . GERD (gastroesophageal reflux disease)   . Heel spur    "both feet" (08/08/2016)  . Hypertension   . Noncompliance   . OSA on CPAP   . Pain management    "for pain BLE; knees down thru feet" (08/08/2016)  . Peritoneal dialysis catheter in place [facility])     Past Surgical History:  Procedure Laterality Date  . CAPD REMOVAL N/A 08/10/2016   Procedure: CONTINUOUS AMBULATORY PERITONEAL DIALYSIS  (CAPD) CATHETER REMOVAL;  Surgeon: Claud Kelp, MD;  Location: [facility] OR;  Service: General;  Laterality: N/A;  . DIALYSIS/PERMA CATHETER INSERTION Right 08/03/2016  . INSERTION OF DIALYSIS CATHETER  ~ 2015   peritoneal dialysis     Social History Social History  Substance Use Topics  . Smoking status: Never Smoker  . Smokeless tobacco: Current User    Types: Snuff  . Alcohol use No    Family History Family History  Problem Relation Age of Onset  . Stroke Mother   . Diabetes Father     Allergies  Allergies  Allergen Reactions  . Sulfa Antibiotics Anaphylaxis, Itching and Rash     Current Outpatient Prescriptions  Medication Sig  Dispense Refill  . allopurinol (ZYLOPRIM) 100 MG tablet Take 100 mg by mouth at bedtime.     . ALPRAZolam (XANAX) 0.5 MG tablet Take 1 tablet (0.5 mg total) by mouth 2 (two) times daily as needed for anxiety. 60 tablet 2  . amLODipine (NORVASC) 10 MG tablet Take 1 tablet (10 mg total) by mouth every other day. 30 tablet 2  . escitalopram (LEXAPRO) 20 MG tablet Take 20 mg by mouth at bedtime.     . furosemide (LASIX) 20 MG tablet Take 2 tablets (40 mg total) by mouth 2 (two) times daily. 60 tablet 2  . metoprolol (LOPRESSOR) 50 MG tablet Take 50 mg by mouth See admin instructions. Take 50 mg by mouth twice daily every other day    . Multiple Vitamins-Minerals (MULTIVITAMIN PO) Take 1 tablet by mouth daily.    Marland Kitchen oxyCODONE (OXYCONTIN) 30 MG 12 hr tablet Take 30 mg by mouth 2 (two) times daily. 60 each 0  . oxyCODONE-acetaminophen (PERCOCET) 10-325 MG tablet Take 1 tablet by mouth 5 (five) times daily as needed for pain. 150 tablet 0  . pantoprazole (PROTONIX) 40 MG tablet Take 40 mg by mouth daily before breakfast.    . pregabalin (LYRICA) 25 MG capsule Take 1 capsule (25 mg total) by mouth 2 (two) times daily as needed (for fibromyalgia flare-up).    . sevelamer carbonate (RENVELA) 800  MG tablet Take 2 tablets (1,600 mg total) by mouth 3 (three) times daily with meals. 180 tablet 0  . Vitamin D, Ergocalciferol, (DRISDOL) 50000 UNITS CAPS capsule Take 50,000 Units by mouth every Wednesday.      No current facility-administered medications for this visit.     ROS:   General:  No weight loss, Fever, chills  HEENT: No recent headaches, no nasal bleeding, no visual changes, no sore throat  Neurologic: No dizziness, blackouts, seizures. No recent symptoms of stroke or mini- stroke. No recent episodes of slurred speech, or temporary blindness.  Cardiac: No recent episodes of chest pain/pressure, no shortness of breath at rest.  No shortness of breath with exertion.  Denies history of atrial  fibrillation or irregular heartbeat  Vascular: No history of rest pain in feet.  No history of claudication.  No history of non-healing ulcer, No history of DVT   Pulmonary: No home oxygen, no productive cough, no hemoptysis,  No asthma or wheezing  Musculoskeletal:  [x ] Arthritis, [ ]  Low back pain,  [ ]  Joint pain  Hematologic:No history of hypercoagulable state.  No history of easy bleeding.  No history of anemia  Gastrointestinal: No hematochezia or melena,  No gastroesophageal reflux, no trouble swallowing  Urinary: [x ] chronic Kidney disease, [x ] on HD - [ x] MWF or [ ]  TTHS, [ ]  Burning with urination, [ ]  Frequent urination, [ ]  Difficulty urinating;   Skin: No rashes  Psychological: No history of anxiety,  No history of depression   Physical Examination  Vitals:   10/20/16 1314  BP: (!) 150/86  Pulse: 83  Resp: 20  Temp: 98.2 F (36.8 C)  TempSrc: Oral  SpO2: 100%  Weight: 197 lb 6.4 oz (89.5 kg)  Height: 5\' 6"  (1.676 m)    Body mass index is 31.86 kg/m.  General:  Alert and oriented, no acute distress HEENT: Normal Neck: No bruit or JVD Pulmonary: Clear to auscultation bilaterally Cardiac: Regular Rate and Rhythm without murmur Gastrointestinal: Soft, non-tender, non-distended, no mass, no scars Skin: No rash Extremity Pulses:  2+ radial, brachial pulses bilaterally Musculoskeletal: No deformity or edema  Neurologic: Upper and lower extremity motor 5/5 and symmetric Psychiatric: Judgment intact, Mood & affect appropriate for pt's clinical situation Lymph : No Cervical, Axillary, or Inguinal lymphadenopathy   DATA:  He has an acceptable right cephalic and B basilic's. 4-5 mm in size per vein mapping in [facility].   ASSESSMENT:  ESRD   PLAN: We will schedule him for a right Brachio cephalic AV fistula verses graft with Dr. on 10/24/2016.  Dr. discussed benefits and risks associated with the procedure.  , Zakaria Fromer  MAUREEN PA-C [facility]  The patient was seen in conjunction with Dr.  Addendum  I have independently interviewed and examined the patient, and I agree with the physician assistant's findings.  While pt is RHD, his cephalic vein is marginal in that arm, subsequently, I counseled the patient to consider a R BC AVF.  His vein appears adequate for such.  The pt would like to proceed as quickly as possible, I have scheduled this procedure with Dr. Summit on 10/24/16.   Risk, benefits, and alternatives to access surgery were discussed.    The patient is aware the risks include but are not limited to: bleeding, infection, steal syndrome, nerve damage, ischemic monomelic neuropathy, thrombosis, failure to mature, complications related to venous hypertension, need for additional  procedures, death and stroke.    The patient agrees to proceed forward with the procedure.   Leonides Sake, MD, FACS [facility] [place] Office: [phone] Pager: [phone]  10/20/2016, 4:49 PM

## 2016-10-21 DIAGNOSIS — N2581 Secondary hyperparathyroidism of renal origin: Secondary | ICD-10-CM | POA: Diagnosis not present

## 2016-10-21 DIAGNOSIS — D500 Iron deficiency anemia secondary to blood loss (chronic): Secondary | ICD-10-CM | POA: Diagnosis not present

## 2016-10-21 DIAGNOSIS — R5081 Fever presenting with conditions classified elsewhere: Secondary | ICD-10-CM | POA: Diagnosis not present

## 2016-10-21 DIAGNOSIS — N186 End stage renal disease: Secondary | ICD-10-CM | POA: Diagnosis not present

## 2016-10-21 DIAGNOSIS — D631 Anemia in chronic kidney disease: Secondary | ICD-10-CM | POA: Diagnosis not present

## 2016-10-21 DIAGNOSIS — R7881 Bacteremia: Secondary | ICD-10-CM | POA: Diagnosis not present

## 2016-10-23 ENCOUNTER — Telehealth: Payer: Self-pay

## 2016-10-23 DIAGNOSIS — N186 End stage renal disease: Secondary | ICD-10-CM | POA: Diagnosis not present

## 2016-10-23 DIAGNOSIS — R5081 Fever presenting with conditions classified elsewhere: Secondary | ICD-10-CM | POA: Diagnosis not present

## 2016-10-23 DIAGNOSIS — D500 Iron deficiency anemia secondary to blood loss (chronic): Secondary | ICD-10-CM | POA: Diagnosis not present

## 2016-10-23 DIAGNOSIS — N2581 Secondary hyperparathyroidism of renal origin: Secondary | ICD-10-CM | POA: Diagnosis not present

## 2016-10-23 DIAGNOSIS — R7881 Bacteremia: Secondary | ICD-10-CM | POA: Diagnosis not present

## 2016-10-23 DIAGNOSIS — D631 Anemia in chronic kidney disease: Secondary | ICD-10-CM | POA: Diagnosis not present

## 2016-10-23 MED ORDER — DEXTROSE 5 % IV SOLN: 1.5000 g | INTRAVENOUS | Status: DC

## 2016-10-23 NOTE — Telephone Encounter (Signed)
Notified by nurse at [facility]. in [place], [place].  Advised that the pt. Has "bacteremia", and Dr. Felipa Furnace, is advising to cancel the procedure, and reschedule in approx. 2 weeks.  Spoke with Arline Asp, Development worker, international aid @ [facility].  Advised the infection is likely coming from his Perm-Cath.  Reported that he will be receiving 2 more doses of Vancomycin.  Stated the Dialysis center will call the office to reschedule the procedure, when infection is cleared up.  Stated that the pt. Is aware of the surgery being cancelled.

## 2016-10-24 ENCOUNTER — Encounter (HOSPITAL_COMMUNITY): Admission: RE | Payer: Self-pay | Source: Ambulatory Visit

## 2016-10-24 ENCOUNTER — Ambulatory Visit (HOSPITAL_COMMUNITY)
Admission: RE | Admit: 2016-10-24 | Payer: BLUE CROSS/BLUE SHIELD | Source: Ambulatory Visit | Admitting: Vascular Surgery

## 2016-10-24 SURGERY — ARTERIOVENOUS (AV) FISTULA CREATION
Anesthesia: Monitor Anesthesia Care | Site: Arm Lower | Laterality: Right

## 2016-10-25 DIAGNOSIS — D631 Anemia in chronic kidney disease: Secondary | ICD-10-CM | POA: Diagnosis not present

## 2016-10-25 DIAGNOSIS — N2581 Secondary hyperparathyroidism of renal origin: Secondary | ICD-10-CM | POA: Diagnosis not present

## 2016-10-25 DIAGNOSIS — D500 Iron deficiency anemia secondary to blood loss (chronic): Secondary | ICD-10-CM | POA: Diagnosis not present

## 2016-10-25 DIAGNOSIS — N186 End stage renal disease: Secondary | ICD-10-CM | POA: Diagnosis not present

## 2016-10-25 DIAGNOSIS — R7881 Bacteremia: Secondary | ICD-10-CM | POA: Diagnosis not present

## 2016-10-25 DIAGNOSIS — R5081 Fever presenting with conditions classified elsewhere: Secondary | ICD-10-CM | POA: Diagnosis not present

## 2016-10-26 DIAGNOSIS — T827XXA Infection and inflammatory reaction due to other cardiac and vascular devices, implants and grafts, initial encounter: Secondary | ICD-10-CM | POA: Diagnosis not present

## 2016-10-30 DIAGNOSIS — Z79899 Other long term (current) drug therapy: Secondary | ICD-10-CM | POA: Diagnosis not present

## 2016-10-30 DIAGNOSIS — N186 End stage renal disease: Secondary | ICD-10-CM | POA: Diagnosis not present

## 2016-10-30 DIAGNOSIS — T827XXA Infection and inflammatory reaction due to other cardiac and vascular devices, implants and grafts, initial encounter: Secondary | ICD-10-CM | POA: Diagnosis not present

## 2016-10-31 ENCOUNTER — Telehealth: Payer: Self-pay | Admitting: Registered Nurse

## 2016-10-31 DIAGNOSIS — D500 Iron deficiency anemia secondary to blood loss (chronic): Secondary | ICD-10-CM | POA: Diagnosis not present

## 2016-10-31 DIAGNOSIS — D631 Anemia in chronic kidney disease: Secondary | ICD-10-CM | POA: Diagnosis not present

## 2016-10-31 DIAGNOSIS — R5081 Fever presenting with conditions classified elsewhere: Secondary | ICD-10-CM | POA: Diagnosis not present

## 2016-10-31 DIAGNOSIS — N2581 Secondary hyperparathyroidism of renal origin: Secondary | ICD-10-CM | POA: Diagnosis not present

## 2016-10-31 DIAGNOSIS — N186 End stage renal disease: Secondary | ICD-10-CM | POA: Diagnosis not present

## 2016-10-31 DIAGNOSIS — R7881 Bacteremia: Secondary | ICD-10-CM | POA: Diagnosis not present

## 2016-10-31 NOTE — Telephone Encounter (Signed)
Pt stated he would like his dosage increased on the current medication he is taking. He is taking .5 mg of Xanax. He is also requesting a different depression medication. He is currently taking 10 mg of Lexapro. Pt stated Dr. Felipa Furnace stated he would no longer prescribe any medications that were not related to the function of his kidneys. Please advise.

## 2016-10-31 NOTE — Telephone Encounter (Signed)
Return Mr. Alkire call, he states his nephrologist decreased his Lexapro to 10 mg. Also states "his nephrologist doesn't want to prescribe any anti-depressants. At this time he states he is going to go to a walk-in PCP office to obtain a PCP. He denies suicidal ideation or plan, admits to being depressed with health issues. Educated on the importance of obtaining a PCP, also the need for a referral for Psychiatrist and Counseling,  He would like to obtain a Therapist, sports and counselor in [place]. Offered to place a referral once he obtains the psychiatrist namehe verbalizes understanding. He states he is going to the walk in clinic right after dialysis to obtain a PCP and hopefully obtain a referral to psychiatry. Instructed to call office regarding the above, he verbalizes understanding.

## 2016-11-03 ENCOUNTER — Encounter (HOSPITAL_COMMUNITY): Payer: Self-pay | Admitting: Emergency Medicine

## 2016-11-03 ENCOUNTER — Emergency Department (HOSPITAL_COMMUNITY): Payer: Medicare Other

## 2016-11-03 ENCOUNTER — Emergency Department (HOSPITAL_COMMUNITY)
Admission: EM | Admit: 2016-11-03 | Discharge: 2016-11-03 | Disposition: A | Discharge status: Home / Self Care | Payer: Medicare Other | Attending: Emergency Medicine | Admitting: Emergency Medicine

## 2016-11-03 DIAGNOSIS — Z992 Dependence on renal dialysis: Secondary | ICD-10-CM | POA: Diagnosis not present

## 2016-11-03 DIAGNOSIS — Z79899 Other long term (current) drug therapy: Secondary | ICD-10-CM | POA: Insufficient documentation

## 2016-11-03 DIAGNOSIS — Z452 Encounter for adjustment and management of vascular access device: Secondary | ICD-10-CM | POA: Insufficient documentation

## 2016-11-03 DIAGNOSIS — M25511 Pain in right shoulder: Secondary | ICD-10-CM | POA: Insufficient documentation

## 2016-11-03 DIAGNOSIS — F17290 Nicotine dependence, other tobacco product, uncomplicated: Secondary | ICD-10-CM | POA: Diagnosis not present

## 2016-11-03 DIAGNOSIS — I999 Unspecified disorder of circulatory system: Secondary | ICD-10-CM

## 2016-11-03 DIAGNOSIS — J45909 Unspecified asthma, uncomplicated: Secondary | ICD-10-CM | POA: Diagnosis not present

## 2016-11-03 DIAGNOSIS — I120 Hypertensive chronic kidney disease with stage 5 chronic kidney disease or end stage renal disease: Secondary | ICD-10-CM | POA: Insufficient documentation

## 2016-11-03 DIAGNOSIS — N186 End stage renal disease: Secondary | ICD-10-CM | POA: Diagnosis not present

## 2016-11-03 NOTE — ED Provider Notes (Signed)
[facility] DEPT Provider Note   CSN: [id number] Arrival date & time: 11/03/16  1718     History   Chief Complaint Chief Complaint  Patient presents with  . Vascular Access Problem    HPI James Kim is a 41 y.o. male.  Patient complains of blood around his central line. He also has some pain in his right shoulder with movement.  he is a dialysis patient   The history is provided by the patient. No language interpreter was used.  Illness  This is a new problem. The current episode started 6 to 12 hours ago. The problem occurs constantly. The problem has not changed since onset.Pertinent negatives include no chest pain, no abdominal pain and no headaches. Nothing aggravates the symptoms. Nothing relieves the symptoms.    Past Medical History:  Diagnosis Date  . Anxiety   . Arthritis    "from my knees down" (08/08/2016)  . Childhood asthma   . 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 Dialysis-associated peritonitis (HCC) 08/08/2016  . ESRD on peritoneal dialysis (HCC)    "01/29/2014-08/07/2016" (08/08/2016)  . GERD (gastroesophageal reflux disease)   . Heel spur    "both feet" (08/08/2016)  . Hypertension   . Noncompliance   . OSA on CPAP   . Pain management    "for pain BLE; knees down thru feet" (08/08/2016)  . Peritoneal dialysis catheter in place [facility])     Patient Active Problem List   Diagnosis Date Noted  . ESRD (end stage renal disease) (HCC) 10/06/2016  . Noncompliance 10/06/2016  . Anemia of chronic disease 10/06/2016  . Volume overload 10/06/2016  . Secondary hyperparathyroidism (HCC) 10/06/2016  . Hyperkalemia 10/05/2016  . Essential hypertension 10/05/2016  . Acute diverticulitis 08/11/2016  . Spontaneous bacterial peritonitis (HCC) 08/10/2016  . Hypokalemia 08/09/2016  . Peritonitis associated with peritoneal dialysis (HCC) 08/09/2016  . Dialysis-associated peritonitis (HCC) 08/08/2016  . Nerve pain 08/30/2015  . Renal dialysis  status 02/23/2014  . Obstructive sleep apnea of adult 12/22/2013  . Low serum cobalamin 12/22/2013  . Acid reflux 12/22/2013  . Dyslipidemia 12/22/2013  . Adiposity 12/19/2013  . BP (high blood pressure) 12/19/2013  . Focal and segmental hyalinosis 12/19/2013  . End stage kidney disease (HCC) 12/19/2013  . Chronic kidney disease 04/11/2013  . Gout, arthropathy 03/05/2013  . Rheumatoid arthritis (HCC) 03/05/2013  . Plantar fasciitis, bilateral 03/05/2013    Past Surgical History:  Procedure Laterality Date  . CAPD REMOVAL N/A 08/10/2016   Procedure: CONTINUOUS AMBULATORY PERITONEAL DIALYSIS  (CAPD) CATHETER REMOVAL;  Surgeon: Claud Kelp, MD;  Location: [facility] OR;  Service: General;  Laterality: N/A;  . DIALYSIS/PERMA CATHETER INSERTION Right 08/03/2016  . INSERTION OF DIALYSIS CATHETER  ~ 2015   peritoneal dialysis       Home Medications    Prior to Admission medications   Medication Sig Start Date End Date Taking? Authorizing Provider  allopurinol (ZYLOPRIM) 100 MG tablet Take 100 mg by mouth at bedtime.    Yes [provider]  ALPRAZolam (XANAX) 0.5 MG tablet Take 1 tablet (0.5 mg total) by mouth 2 (two) times daily as needed for anxiety. 09/06/16  Yes Ranelle Oyster, MD  amLODipine (NORVASC) 10 MG tablet Take 1 tablet (10 mg total) by mouth every other day. 10/06/16  Yes Rama, Maryruth Bun, MD  escitalopram (LEXAPRO) 20 MG tablet Take 20 mg by mouth at bedtime.    Yes [provider]  furosemide (LASIX) 20  MG tablet Take 2 tablets (40 mg total) by mouth 2 (two) times daily. 10/06/16  Yes Rama, Maryruth Bun, MD  metoprolol (LOPRESSOR) 50 MG tablet Take 50 mg by mouth 2 (two) times daily.    Yes [provider]  Multiple Vitamins-Minerals (MULTIVITAMIN PO) Take 1 tablet by mouth daily.   Yes [provider]  oxyCODONE (OXYCONTIN) 30 MG 12 hr tablet Take 30 mg by mouth 2 (two) times daily. 10/09/16  Yes Jones Bales, NP  oxyCODONE-acetaminophen  (PERCOCET) 10-325 MG tablet Take 1 tablet by mouth 5 (five) times daily as needed for pain. 10/09/16  Yes Jones Bales, NP  pantoprazole (PROTONIX) 40 MG tablet Take 40 mg by mouth daily before breakfast.   Yes [provider]  pregabalin (LYRICA) 25 MG capsule Take 1 capsule (25 mg total) by mouth 2 (two) times daily as needed (for fibromyalgia flare-up). 08/11/16  Yes Short, Thea Silversmith, MD  promethazine (PHENERGAN) 12.5 MG tablet Take 12.5 mg by mouth daily as needed for nausea or vomiting.  10/03/16  Yes [provider]  sevelamer carbonate (RENVELA) 800 MG tablet Take 2 tablets (1,600 mg total) by mouth 3 (three) times daily with meals. 08/11/16  Yes Renae Fickle, MD  vancomycin IVPB Inject into the vein every Monday, Wednesday, and Friday.   Yes [provider]  Vitamin D, Ergocalciferol, (DRISDOL) 50000 UNITS CAPS capsule Take 50,000 Units by mouth every Wednesday.    Yes [provider]    Family History Family History  Problem Relation Age of Onset  . Stroke Mother   . Diabetes Father     Social History Social History  Substance Use Topics  . Smoking status: Never Smoker  . Smokeless tobacco: Current User    Types: Snuff  . Alcohol use No     Allergies   Sulfa antibiotics   Review of Systems Review of Systems  Constitutional: Negative for appetite change and fatigue.  HENT: Negative for congestion, ear discharge and sinus pressure.   Eyes: Negative for discharge.  Respiratory: Negative for cough.   Cardiovascular: Negative for chest pain.  Gastrointestinal: Negative for abdominal pain and diarrhea.  Genitourinary: Negative for frequency and hematuria.  Musculoskeletal: Negative for back pain.       Right shoulder pain  Skin: Negative for rash.  Neurological: Negative for seizures and headaches.  Psychiatric/Behavioral: Negative for hallucinations.     Physical Exam Updated Vital Signs BP (!) 139/92 (BP Location: Right Arm)    Pulse 70   Temp 98.2 F (36.8 C) (Oral)   Resp 18   Ht 5\' 6"  (1.676 m)   Wt 89.4 kg (197 lb)   SpO2 100%   BMI 31.80 kg/m   Physical Exam  Constitutional: He is oriented to person, place, and time. He appears well-developed.  HENT:  Head: Normocephalic.  Eyes: Conjunctivae and EOM are normal. No scleral icterus.  Neck: Neck supple. No thyromegaly present.  Cardiovascular: Normal rate and regular rhythm.  Exam reveals no gallop and no friction rub.   No murmur heard. Pulmonary/Chest: No stridor. He has no wheezes. He has no rales. He exhibits no tenderness.  Patient had a central line in his left chest. Minimal blood at the insertion.  Abdominal: He exhibits no distension. There is no tenderness. There is no rebound.  Musculoskeletal: Normal range of motion. He exhibits no edema.  Binder tenderness in right shoulder  Lymphadenopathy:    He has no cervical adenopathy.  Neurological: He is oriented  to person, place, and time. He exhibits normal muscle tone. Coordination normal.  Skin: No rash noted. No erythema.  Psychiatric: He has a normal mood and affect. His behavior is normal.     ED Treatments / Results  Labs (all labs ordered are listed, but only abnormal results are displayed) Labs Reviewed - No data to display  EKG  EKG Interpretation None       Radiology Dg Chest 2 View  Result Date: 11/03/2016 CLINICAL DATA:  41 year old male status post dialysis 4 days ago, bleeding from left chest PermCath site today. Denies pain. EXAM: CHEST  2 VIEW COMPARISON:  10/05/2016 and earlier. FINDINGS: Previously-seen right chest dialysis catheter has been removed, and a left chest tunneled type dual lumen dialysis catheter is now in place. No adverse features identified. Lung volumes remain normal. The lungs are clear. No pneumothorax or pleural effusion. Normal cardiac size and mediastinal contours. Visualized tracheal air column is within normal limits. No acute osseous  abnormality identified. Negative visible bowel gas pattern. IMPRESSION: Tunneled left chest dialysis catheter with no adverse features. No acute cardiopulmonary abnormality. Electronically Signed   By: Odessa Fleming M.D.   On: 11/03/2016 20:00    Procedures Procedures (including critical care time)  Medications Ordered in ED Medications - No data to display   Initial Impression / Assessment and Plan / ED Course  I have reviewed the triage vital signs and the nursing notes.  Pertinent labs & imaging results that were available during my care of the patient were reviewed by me and considered in my medical decision making (see chart for details).     Central line insertion area was cleaned and redressed. He will follow-up tomorrow for his dialysis. Suspect his right shoulder he has some ligament inflammation  Final Clinical Impressions(s) / ED Diagnoses   Final diagnoses:  Vascular abnormality    New Prescriptions New Prescriptions   No medications on file     Bethann Berkshire, MD 11/03/16 2019

## 2016-11-03 NOTE — Discharge Instructions (Signed)
Follow up tomorrow for dialysis as planned

## 2016-11-03 NOTE — ED Triage Notes (Signed)
PT states last dialysis on 10/30/16 and states today when he woke up he has noticed some blood from upper left chest permacath under Tegaderm. PT denies any pain to the area.

## 2016-11-06 DIAGNOSIS — D500 Iron deficiency anemia secondary to blood loss (chronic): Secondary | ICD-10-CM | POA: Diagnosis not present

## 2016-11-06 DIAGNOSIS — E8770 Fluid overload, unspecified: Secondary | ICD-10-CM | POA: Diagnosis not present

## 2016-11-06 DIAGNOSIS — R7881 Bacteremia: Secondary | ICD-10-CM | POA: Diagnosis not present

## 2016-11-06 DIAGNOSIS — N186 End stage renal disease: Secondary | ICD-10-CM | POA: Diagnosis not present

## 2016-11-06 DIAGNOSIS — D631 Anemia in chronic kidney disease: Secondary | ICD-10-CM | POA: Diagnosis not present

## 2016-11-06 DIAGNOSIS — N2581 Secondary hyperparathyroidism of renal origin: Secondary | ICD-10-CM | POA: Diagnosis not present

## 2016-11-06 DIAGNOSIS — E8809 Other disorders of plasma-protein metabolism, not elsewhere classified: Secondary | ICD-10-CM | POA: Diagnosis not present

## 2016-11-06 DIAGNOSIS — D689 Coagulation defect, unspecified: Secondary | ICD-10-CM | POA: Diagnosis not present

## 2016-11-07 ENCOUNTER — Encounter: Payer: Medicare Other | Attending: Physical Medicine & Rehabilitation | Admitting: Registered Nurse

## 2016-11-07 ENCOUNTER — Encounter: Payer: Self-pay | Admitting: Registered Nurse

## 2016-11-07 VITALS — BP 157/91 | HR 83 | Resp 14

## 2016-11-07 DIAGNOSIS — M792 Neuralgia and neuritis, unspecified: Secondary | ICD-10-CM | POA: Diagnosis not present

## 2016-11-07 DIAGNOSIS — N185 Chronic kidney disease, stage 5: Secondary | ICD-10-CM | POA: Insufficient documentation

## 2016-11-07 DIAGNOSIS — M722 Plantar fascial fibromatosis: Secondary | ICD-10-CM | POA: Diagnosis not present

## 2016-11-07 DIAGNOSIS — Z6832 Body mass index (BMI) 32.0-32.9, adult: Secondary | ICD-10-CM | POA: Diagnosis not present

## 2016-11-07 DIAGNOSIS — Z992 Dependence on renal dialysis: Secondary | ICD-10-CM | POA: Diagnosis not present

## 2016-11-07 DIAGNOSIS — M069 Rheumatoid arthritis, unspecified: Secondary | ICD-10-CM | POA: Diagnosis not present

## 2016-11-07 DIAGNOSIS — G894 Chronic pain syndrome: Secondary | ICD-10-CM | POA: Diagnosis not present

## 2016-11-07 DIAGNOSIS — I10 Essential (primary) hypertension: Secondary | ICD-10-CM | POA: Diagnosis not present

## 2016-11-07 DIAGNOSIS — Z79899 Other long term (current) drug therapy: Secondary | ICD-10-CM | POA: Insufficient documentation

## 2016-11-07 DIAGNOSIS — M1009 Idiopathic gout, multiple sites: Secondary | ICD-10-CM | POA: Diagnosis not present

## 2016-11-07 DIAGNOSIS — Z5181 Encounter for therapeutic drug level monitoring: Secondary | ICD-10-CM | POA: Diagnosis not present

## 2016-11-07 DIAGNOSIS — N186 End stage renal disease: Secondary | ICD-10-CM | POA: Diagnosis not present

## 2016-11-07 DIAGNOSIS — Z713 Dietary counseling and surveillance: Secondary | ICD-10-CM | POA: Diagnosis not present

## 2016-11-07 DIAGNOSIS — M109 Gout, unspecified: Secondary | ICD-10-CM

## 2016-11-07 DIAGNOSIS — G4730 Sleep apnea, unspecified: Secondary | ICD-10-CM | POA: Diagnosis not present

## 2016-11-07 MED ORDER — ALPRAZOLAM 0.5 MG PO TABS: 0.5000 mg | ORAL_TABLET | Freq: Two times a day (BID) | ORAL | 2 refills | Status: DC | PRN

## 2016-11-07 MED ORDER — OXYCODONE HCL ER 30 MG PO T12A: 30.0000 mg | EXTENDED_RELEASE_TABLET | Freq: Two times a day (BID) | ORAL | 0 refills | Status: DC

## 2016-11-07 MED ORDER — OXYCODONE-ACETAMINOPHEN 10-325 MG PO TABS: 1.0000 | ORAL_TABLET | Freq: Every day | ORAL | 0 refills | Status: DC | PRN

## 2016-11-07 NOTE — Progress Notes (Signed)
Subjective:    Patient ID: James Kim, male    DOB: Jun 24, 1975, 41 y.o.   MRN: [id number]  HPI: [name] is a 41 year old male who returns for follow up appointmentfor chronic pain and medication refill. He states his pain is located in his bilateral lower extremities and bilateral feet. He rates his pain 4.His current exercise regime is walking and light yard work.   His last Oral swab was done on 10/09/2016, it was consistent.   Pain Inventory Average Pain 8 Pain Right Now 4 My pain is sharp and stabbing  In the last 24 hours, has pain interfered with the following? General activity 6 Relation with others 8 Enjoyment of life 10 What TIME of day is your pain at its worst? morning, night Sleep (in general) Poor  Pain is worse with: walking, standing and some activites Pain improves with: rest, heat/ice, therapy/exercise, medication and injections Relief from Meds: 5  Mobility walk without assistance how many minutes can you walk? 10-15 ability to climb steps?  yes do you drive?  yes Do you have any goals in this area?  yes  Function disabled: date disabled . I need assistance with the following:  bathing and household duties Do you have any goals in this area?  yes  Neuro/Psych weakness trouble walking anxiety  Prior Studies Any changes since last visit?  no  Physicians involved in your care Any changes since last visit?  no   Family History  Problem Relation Age of Onset  . Stroke Mother   . Diabetes Father    Social History   Social History  . Marital status: Married    Spouse name: N/A  . Number of children: N/A  . Years of education: N/A   Social History Main Topics  . Smoking status: Never Smoker  . Smokeless tobacco: Current User    Types: Snuff  . Alcohol use No  . Drug use: No  . Sexual activity: Yes   Other Topics Concern  . None   Social History Narrative  . None   Past Surgical History:  Procedure Laterality  Date  . CAPD REMOVAL N/A 08/10/2016   Procedure: CONTINUOUS AMBULATORY PERITONEAL DIALYSIS  (CAPD) CATHETER REMOVAL;  Surgeon: Claud Kelp, MD;  Location: [facility] OR;  Service: General;  Laterality: N/A;  . DIALYSIS/PERMA CATHETER INSERTION Right 08/03/2016  . INSERTION OF DIALYSIS CATHETER  ~ 2015   peritoneal dialysis   Past Medical History:  Diagnosis Date  . Anxiety   . Arthritis    "from my knees down" (08/08/2016)  . Childhood asthma   . 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 Dialysis-associated peritonitis (HCC) 08/08/2016  . ESRD on peritoneal dialysis (HCC)    "01/29/2014-08/07/2016" (08/08/2016)  . GERD (gastroesophageal reflux disease)   . Heel spur    "both feet" (08/08/2016)  . Hypertension   . Noncompliance   . OSA on CPAP   . Pain management    "for pain BLE; knees down thru feet" (08/08/2016)  . Peritoneal dialysis catheter in place (HCC)    BP (!) 157/91 (BP Location: Right Arm, Patient Position: Sitting, Cuff Size: Normal)   Pulse 83   Resp 14   SpO2 98%   Opioid Risk Score:   Fall Risk Score:  `1  Depression screen PHQ 2/9  Depression screen [facility] 2/9 01/19/2016 08/03/2015 01/25/2015 08/10/2014  Decreased Interest 1 1 0 2  Down, Depressed, Hopeless 1 1  0 1  PHQ - 2 Score 2 2 0 3  Altered sleeping - 2 - 3  Tired, decreased energy - 1 - 3  Change in appetite - 1 - 3  Feeling bad or failure about yourself  - 0 - 1  Trouble concentrating - 0 - 2  Moving slowly or fidgety/restless - 0 - 0  Suicidal thoughts - 0 - 0  PHQ-9 Score - 6 - 15  Difficult doing work/chores - Not difficult at all - -    Review of Systems  Constitutional: Positive for appetite change and unexpected weight change.  HENT: Negative.   Eyes: Negative.   Respiratory: Positive for apnea and shortness of breath.   Cardiovascular: Negative.   Gastrointestinal: Negative.   Endocrine: Negative.   Genitourinary: Positive for difficulty urinating.  Musculoskeletal: Positive for gait  problem.  Skin: Negative.   Allergic/Immunologic: Negative.   Neurological: Positive for weakness.  Psychiatric/Behavioral: The patient is nervous/anxious.   All other systems reviewed and are negative.      Objective:   Physical Exam  Constitutional: He is oriented to person, place, and time. He appears well-developed and well-nourished.  HENT:  Head: Normocephalic and atraumatic.  Neck: Normal range of motion. Neck supple.  Cardiovascular: Normal rate and regular rhythm.   Pulmonary/Chest: Effort normal and breath sounds normal.  Musculoskeletal:  Normal Muscle Bulk and Muscle Testing Reveals: Upper Extremities: Full ROM and Muscle Strength 5/5 Lower Extremities: Full ROM and Muscle Strength 5/5 Bilateral Lower Extremities Flexion Produces Pain into Bilateral Heels Arises from the Table Slowly Antalgic Gait  Neurological: He is alert and oriented to person, place, and time.  Skin: Skin is warm and dry.  Psychiatric: He has a normal mood and affect.  Nursing note and vitals reviewed.         Assessment & Plan:  1.Chronic foot pain with history of plantar fasciitis/ Gout Arthropathy: 11/07/2016 Refilled:Oxycodone 10/325 mg one tablet 5 times a day as needed #150 and Oxycontin 30mg  one capsule every 12 hours #60. Continue Lyrica. We will continue the opioid monitoring program this consists of, regular clinic visits, examinations, urine drug screen, pill counts as well as use of Controlled Substance Reporting System. 2.Chronic Kidney Disease stage V:On Hemo- Dialysis/ Nephrology Following. 11/07/2016 3. Anxiety: Continue Xanax 0.5 mg BID/prn. 11/07/2016  20 minutes of face to face patient care time was spent during this visit. All questions were encouraged and answered.     F/U in 1 month

## 2016-11-08 DIAGNOSIS — N2581 Secondary hyperparathyroidism of renal origin: Secondary | ICD-10-CM | POA: Diagnosis not present

## 2016-11-08 DIAGNOSIS — D689 Coagulation defect, unspecified: Secondary | ICD-10-CM | POA: Diagnosis not present

## 2016-11-08 DIAGNOSIS — E8809 Other disorders of plasma-protein metabolism, not elsewhere classified: Secondary | ICD-10-CM | POA: Diagnosis not present

## 2016-11-08 DIAGNOSIS — N186 End stage renal disease: Secondary | ICD-10-CM | POA: Diagnosis not present

## 2016-11-08 DIAGNOSIS — R7881 Bacteremia: Secondary | ICD-10-CM | POA: Diagnosis not present

## 2016-11-09 ENCOUNTER — Encounter: Payer: BLUE CROSS/BLUE SHIELD | Admitting: Vascular Surgery

## 2016-11-10 ENCOUNTER — Encounter: Payer: Self-pay | Admitting: Internal Medicine

## 2016-11-10 DIAGNOSIS — D689 Coagulation defect, unspecified: Secondary | ICD-10-CM | POA: Diagnosis not present

## 2016-11-10 DIAGNOSIS — N2581 Secondary hyperparathyroidism of renal origin: Secondary | ICD-10-CM | POA: Diagnosis not present

## 2016-11-10 DIAGNOSIS — N186 End stage renal disease: Secondary | ICD-10-CM | POA: Diagnosis not present

## 2016-11-10 DIAGNOSIS — E8809 Other disorders of plasma-protein metabolism, not elsewhere classified: Secondary | ICD-10-CM | POA: Diagnosis not present

## 2016-11-10 DIAGNOSIS — R784 Finding of other drugs of addictive potential in blood: Secondary | ICD-10-CM | POA: Diagnosis not present

## 2016-11-10 DIAGNOSIS — R7881 Bacteremia: Secondary | ICD-10-CM | POA: Diagnosis not present

## 2016-11-11 DIAGNOSIS — E8809 Other disorders of plasma-protein metabolism, not elsewhere classified: Secondary | ICD-10-CM | POA: Diagnosis not present

## 2016-11-11 DIAGNOSIS — N2581 Secondary hyperparathyroidism of renal origin: Secondary | ICD-10-CM | POA: Diagnosis not present

## 2016-11-11 DIAGNOSIS — D689 Coagulation defect, unspecified: Secondary | ICD-10-CM | POA: Diagnosis not present

## 2016-11-11 DIAGNOSIS — R7881 Bacteremia: Secondary | ICD-10-CM | POA: Diagnosis not present

## 2016-11-11 DIAGNOSIS — N186 End stage renal disease: Secondary | ICD-10-CM | POA: Diagnosis not present

## 2016-11-13 DIAGNOSIS — E8809 Other disorders of plasma-protein metabolism, not elsewhere classified: Secondary | ICD-10-CM | POA: Diagnosis not present

## 2016-11-13 DIAGNOSIS — N2581 Secondary hyperparathyroidism of renal origin: Secondary | ICD-10-CM | POA: Diagnosis not present

## 2016-11-13 DIAGNOSIS — R7881 Bacteremia: Secondary | ICD-10-CM | POA: Diagnosis not present

## 2016-11-13 DIAGNOSIS — D689 Coagulation defect, unspecified: Secondary | ICD-10-CM | POA: Diagnosis not present

## 2016-11-13 DIAGNOSIS — N186 End stage renal disease: Secondary | ICD-10-CM | POA: Diagnosis not present

## 2016-11-14 DIAGNOSIS — Z4802 Encounter for removal of sutures: Secondary | ICD-10-CM | POA: Diagnosis not present

## 2016-11-15 ENCOUNTER — Inpatient Hospital Stay (HOSPITAL_COMMUNITY)
Admission: EM | Admit: 2016-11-15 | Discharge: 2016-11-18 | DRG: 682 | Disposition: A | Discharge status: Home / Self Care | Payer: Medicare Other | Attending: Internal Medicine | Admitting: Internal Medicine

## 2016-11-15 ENCOUNTER — Inpatient Hospital Stay (HOSPITAL_COMMUNITY): Payer: Medicare Other

## 2016-11-15 ENCOUNTER — Encounter (HOSPITAL_COMMUNITY): Payer: Self-pay | Admitting: Emergency Medicine

## 2016-11-15 ENCOUNTER — Emergency Department (HOSPITAL_COMMUNITY): Payer: Medicare Other

## 2016-11-15 DIAGNOSIS — G9341 Metabolic encephalopathy: Secondary | ICD-10-CM | POA: Diagnosis present

## 2016-11-15 DIAGNOSIS — R400 Somnolence: Secondary | ICD-10-CM | POA: Diagnosis not present

## 2016-11-15 DIAGNOSIS — M069 Rheumatoid arthritis, unspecified: Secondary | ICD-10-CM | POA: Diagnosis present

## 2016-11-15 DIAGNOSIS — I120 Hypertensive chronic kidney disease with stage 5 chronic kidney disease or end stage renal disease: Secondary | ICD-10-CM | POA: Diagnosis not present

## 2016-11-15 DIAGNOSIS — Z882 Allergy status to sulfonamides status: Secondary | ICD-10-CM

## 2016-11-15 DIAGNOSIS — R4182 Altered mental status, unspecified: Secondary | ICD-10-CM | POA: Diagnosis present

## 2016-11-15 DIAGNOSIS — F17290 Nicotine dependence, other tobacco product, uncomplicated: Secondary | ICD-10-CM | POA: Diagnosis present

## 2016-11-15 DIAGNOSIS — E874 Mixed disorder of acid-base balance: Secondary | ICD-10-CM | POA: Diagnosis present

## 2016-11-15 DIAGNOSIS — D631 Anemia in chronic kidney disease: Secondary | ICD-10-CM | POA: Diagnosis present

## 2016-11-15 DIAGNOSIS — K219 Gastro-esophageal reflux disease without esophagitis: Secondary | ICD-10-CM | POA: Diagnosis present

## 2016-11-15 DIAGNOSIS — R7881 Bacteremia: Secondary | ICD-10-CM | POA: Diagnosis present

## 2016-11-15 DIAGNOSIS — G4733 Obstructive sleep apnea (adult) (pediatric): Secondary | ICD-10-CM | POA: Diagnosis present

## 2016-11-15 DIAGNOSIS — I517 Cardiomegaly: Secondary | ICD-10-CM | POA: Diagnosis not present

## 2016-11-15 DIAGNOSIS — M899 Disorder of bone, unspecified: Secondary | ICD-10-CM | POA: Diagnosis present

## 2016-11-15 DIAGNOSIS — R410 Disorientation, unspecified: Secondary | ICD-10-CM | POA: Diagnosis not present

## 2016-11-15 DIAGNOSIS — N186 End stage renal disease: Secondary | ICD-10-CM | POA: Diagnosis present

## 2016-11-15 DIAGNOSIS — M109 Gout, unspecified: Secondary | ICD-10-CM | POA: Diagnosis present

## 2016-11-15 DIAGNOSIS — D638 Anemia in other chronic diseases classified elsewhere: Secondary | ICD-10-CM | POA: Diagnosis present

## 2016-11-15 DIAGNOSIS — F11929 Opioid use, unspecified with intoxication, unspecified: Secondary | ICD-10-CM | POA: Diagnosis present

## 2016-11-15 DIAGNOSIS — T402X1A Poisoning by other opioids, accidental (unintentional), initial encounter: Secondary | ICD-10-CM | POA: Diagnosis present

## 2016-11-15 DIAGNOSIS — Z992 Dependence on renal dialysis: Secondary | ICD-10-CM

## 2016-11-15 DIAGNOSIS — Z79899 Other long term (current) drug therapy: Secondary | ICD-10-CM

## 2016-11-15 DIAGNOSIS — M1990 Unspecified osteoarthritis, unspecified site: Secondary | ICD-10-CM | POA: Diagnosis present

## 2016-11-15 DIAGNOSIS — I10 Essential (primary) hypertension: Secondary | ICD-10-CM | POA: Diagnosis present

## 2016-11-15 LAB — BLOOD GAS, ARTERIAL
ACID-BASE EXCESS: 2 mmol/L (ref 0.0–2.0)
Acid-Base Excess: 3.6 mmol/L — ABNORMAL HIGH (ref 0.0–2.0)
Bicarbonate: 26.8 mmol/L (ref 20.0–28.0)
Bicarbonate: 25.8 mmol/L (ref 20.0–28.0)
DRAWN BY: 277331
Delivery systems: POSITIVE
Drawn by: 277331
Expiratory PAP: 6
FIO2: 0.21
FIO2: 0.5
Inspiratory PAP: 14
LHR: 12 {breaths}/min
O2 SAT: 99.3 %
O2 Saturation: 91.4 %
PATIENT TEMPERATURE: 37
PCO2 ART: 51.7 mmHg — AB (ref 32.0–48.0)
PH ART: 7.34 — AB (ref 7.350–7.450)
PO2 ART: 177 mmHg — AB (ref 83.0–108.0)
Patient temperature: 37
pCO2 arterial: 62.9 mmHg — ABNORMAL HIGH (ref 32.0–48.0)
pH, Arterial: 7.295 — ABNORMAL LOW (ref 7.350–7.450)
pO2, Arterial: 68.6 mmHg — ABNORMAL LOW (ref 83.0–108.0)

## 2016-11-15 LAB — COMPREHENSIVE METABOLIC PANEL
ALT: 16 U/L — ABNORMAL LOW (ref 17–63)
AST: 19 U/L (ref 15–41)
Albumin: 3.5 g/dL (ref 3.5–5.0)
Alkaline Phosphatase: 75 U/L (ref 38–126)
Anion gap: 12 (ref 5–15)
BUN: 39 mg/dL — ABNORMAL HIGH (ref 6–20)
CO2: 30 mmol/L (ref 22–32)
Calcium: 9.6 mg/dL (ref 8.9–10.3)
Chloride: 99 mmol/L — ABNORMAL LOW (ref 101–111)
Creatinine, Ser: 9.12 mg/dL — ABNORMAL HIGH (ref 0.61–1.24)
GFR calc Af Amer: 7 mL/min — ABNORMAL LOW (ref >60)
GFR calc non Af Amer: 6 mL/min — ABNORMAL LOW (ref >60)
Glucose, Bld: 82 mg/dL (ref 65–99)
Potassium: 4.6 mmol/L (ref 3.5–5.1)
Sodium: 141 mmol/L (ref 135–145)
Total Bilirubin: 0.6 mg/dL (ref 0.3–1.2)
Total Protein: 7.6 g/dL (ref 6.5–8.1)

## 2016-11-15 LAB — CBC WITH DIFFERENTIAL/PLATELET
Basophils Absolute: 0 10*3/uL (ref 0.0–0.1)
Basophils Relative: 0 %
Eosinophils Absolute: 0.2 10*3/uL (ref 0.0–0.7)
Eosinophils Relative: 3 %
HCT: 30.3 % — ABNORMAL LOW (ref 39.0–52.0)
Hemoglobin: 9.9 g/dL — ABNORMAL LOW (ref 13.0–17.0)
Lymphocytes Relative: 26 %
Lymphs Abs: 1.7 10*3/uL (ref 0.7–4.0)
MCH: 30.2 pg (ref 26.0–34.0)
MCHC: 32.7 g/dL (ref 30.0–36.0)
MCV: 92.4 fL (ref 78.0–100.0)
Monocytes Absolute: 0.7 10*3/uL (ref 0.1–1.0)
Monocytes Relative: 11 %
Neutro Abs: 3.9 10*3/uL (ref 1.7–7.7)
Neutrophils Relative %: 60 %
Platelets: 148 10*3/uL — ABNORMAL LOW (ref 150–400)
RBC: 3.28 MIL/uL — ABNORMAL LOW (ref 4.22–5.81)
RDW: 17.3 % — ABNORMAL HIGH (ref 11.5–15.5)
WBC: 6.4 10*3/uL (ref 4.0–10.5)

## 2016-11-15 LAB — LACTIC ACID, PLASMA
LACTIC ACID, VENOUS: 0.4 mmol/L — AB (ref 0.5–1.9)
Lactic Acid, Venous: 0.5 mmol/L (ref 0.5–1.9)

## 2016-11-15 MED ORDER — ALBUTEROL SULFATE (2.5 MG/3ML) 0.083% IN NEBU: 2.5000 mg | INHALATION_SOLUTION | Freq: Four times a day (QID) | RESPIRATORY_TRACT | Status: DC | PRN

## 2016-11-15 MED ORDER — FUROSEMIDE 10 MG/ML IJ SOLN
40.0000 mg | Freq: Once | INTRAMUSCULAR | Status: AC
  Administered 2016-11-15: 40 mg via INTRAVENOUS
  Filled 2016-11-15: qty 4

## 2016-11-15 MED ORDER — ONDANSETRON HCL 4 MG PO TABS: 4.0000 mg | ORAL_TABLET | Freq: Four times a day (QID) | ORAL | Status: DC | PRN

## 2016-11-15 MED ORDER — ONDANSETRON HCL 4 MG/2ML IJ SOLN: 4.0000 mg | Freq: Four times a day (QID) | INTRAMUSCULAR | Status: DC | PRN

## 2016-11-15 MED ORDER — HEPARIN SODIUM (PORCINE) 5000 UNIT/ML IJ SOLN
5000.0000 [IU] | Freq: Three times a day (TID) | INTRAMUSCULAR | Status: DC
  Administered 2016-11-15 – 2016-11-18 (×8): 5000 [IU] via SUBCUTANEOUS
  Filled 2016-11-15 (×8): qty 1

## 2016-11-15 MED ORDER — METOPROLOL TARTRATE 5 MG/5ML IV SOLN
5.0000 mg | Freq: Three times a day (TID) | INTRAVENOUS | Status: DC
  Administered 2016-11-16 – 2016-11-17 (×4): 5 mg via INTRAVENOUS
  Filled 2016-11-15 (×5): qty 5

## 2016-11-15 NOTE — ED Triage Notes (Signed)
Dialysis pt MWF, last tx was Monday. Pt is here today for altered mental status x3-4 days, confusion, weakness, and falling asleep easily. Pt alert and oriented at this time.

## 2016-11-15 NOTE — Progress Notes (Signed)
**Note De-Identified Judi Jaffe Obfuscation** Patient tolerated transport to ICU on BIPAP.  RRT to continue to monitor.

## 2016-11-15 NOTE — ED Provider Notes (Signed)
[facility]Y DEPT Provider Note   CSN: [id number] Arrival date & time: 11/15/16  1440  By signing my name below, I, Linna Darner, attest that this documentation has been prepared under the direction and in the presence of physician practitioner, Raeford Razor, MD. Electronically Signed: Linna Darner, Scribe. 11/15/2016. 3:12 PM.  History   Chief Complaint Chief Complaint  Patient presents with  . Altered Mental Status   The history is provided by the spouse. No language interpreter was used.    HPI Comments: LEVEL 5 CAVEAT FOR ALTERED MENTAL STATUS James Kim is a 41 y.o. male brought in by his wife, with PMHx of ESRD on HD MWF and HTN, who presents to the Emergency Department for evaluation of altered mental status. Wife reports that patient has been increasingly lethargic and unresponsive for 3-4 days. Wife notes that when patient has been alert he has not had any physical complaints. Patient receives dialysis treatment on MWF and did not go today, but otherwise has not missed any sessions. Wife states that patient has had one similar episode of lethargy in the past related to complications with his CPAP machine. Wife also notes that patient is currently on vancomycin due to a recent sepsis diagnosis. Patient has used CPAP on a nightly basis for the last 4-5 years. No new medications. Per wife, he denies fevers, chills, or any other associated symptoms.  Past Medical History:  Diagnosis Date  . Anxiety   . Arthritis    "from my knees down" (08/08/2016)  . Childhood asthma   . 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 Dialysis-associated peritonitis (HCC) 08/08/2016  . ESRD on peritoneal dialysis (HCC)    "01/29/2014-08/07/2016" (08/08/2016)  . GERD (gastroesophageal reflux disease)   . Heel spur    "both feet" (08/08/2016)  . Hypertension   . Noncompliance   . OSA on CPAP   . Pain management    "for pain BLE; knees down thru feet" (08/08/2016)  . Peritoneal  dialysis catheter in place [facility])     Patient Active Problem List   Diagnosis Date Noted  . ESRD (end stage renal disease) (HCC) 10/06/2016  . Noncompliance 10/06/2016  . Anemia of chronic disease 10/06/2016  . Volume overload 10/06/2016  . Secondary hyperparathyroidism (HCC) 10/06/2016  . Hyperkalemia 10/05/2016  . Essential hypertension 10/05/2016  . Acute diverticulitis 08/11/2016  . Spontaneous bacterial peritonitis (HCC) 08/10/2016  . Hypokalemia 08/09/2016  . Peritonitis associated with peritoneal dialysis (HCC) 08/09/2016  . Dialysis-associated peritonitis (HCC) 08/08/2016  . Nerve pain 08/30/2015  . Renal dialysis status 02/23/2014  . Obstructive sleep apnea of adult 12/22/2013  . Low serum cobalamin 12/22/2013  . Acid reflux 12/22/2013  . Dyslipidemia 12/22/2013  . Adiposity 12/19/2013  . BP (high blood pressure) 12/19/2013  . Focal and segmental hyalinosis 12/19/2013  . End stage kidney disease (HCC) 12/19/2013  . Chronic kidney disease 04/11/2013  . Gout, arthropathy 03/05/2013  . Rheumatoid arthritis (HCC) 03/05/2013  . Plantar fasciitis, bilateral 03/05/2013    Past Surgical History:  Procedure Laterality Date  . CAPD REMOVAL N/A 08/10/2016   Procedure: CONTINUOUS AMBULATORY PERITONEAL DIALYSIS  (CAPD) CATHETER REMOVAL;  Surgeon: Claud Kelp, MD;  Location: [facility] OR;  Service: General;  Laterality: N/A;  . DIALYSIS/PERMA CATHETER INSERTION Right 08/03/2016  . INSERTION OF DIALYSIS CATHETER  ~ 2015   peritoneal dialysis       Home Medications    Prior to Admission medications   Medication Sig Start Date  End Date Taking? Authorizing Provider  allopurinol (ZYLOPRIM) 100 MG tablet Take 100 mg by mouth at bedtime.     [provider]  ALPRAZolam Prudy Feeler) 0.5 MG tablet Take 1 tablet (0.5 mg total) by mouth 2 (two) times daily as needed for anxiety. 11/07/16   Jones Bales, NP  amLODipine (NORVASC) 10 MG tablet Take 1 tablet (10 mg total) by mouth every  other day. 10/06/16   Rama, Maryruth Bun, MD  escitalopram (LEXAPRO) 20 MG tablet Take 20 mg by mouth at bedtime.     [provider]  furosemide (LASIX) 20 MG tablet Take 2 tablets (40 mg total) by mouth 2 (two) times daily. 10/06/16   Rama, Maryruth Bun, MD  metoprolol tartrate (LOPRESSOR) 100 MG tablet Take 1 tablet by mouth daily. 11/07/16   [provider]  Multiple Vitamins-Minerals (MULTIVITAMIN PO) Take 1 tablet by mouth daily.    [provider]  omeprazole (PRILOSEC) 20 MG capsule Take 1 capsule by mouth daily. 11/07/16   [provider]  oxyCODONE (OXYCONTIN) 30 MG 12 hr tablet Take 30 mg by mouth 2 (two) times daily. 11/07/16   Jones Bales, NP  oxyCODONE-acetaminophen (PERCOCET) 10-325 MG tablet Take 1 tablet by mouth 5 (five) times daily as needed for pain. 11/07/16   Jones Bales, NP  pantoprazole (PROTONIX) 40 MG tablet Take 40 mg by mouth daily before breakfast.    [provider]  pregabalin (LYRICA) 25 MG capsule Take 1 capsule (25 mg total) by mouth 2 (two) times daily as needed (for fibromyalgia flare-up). 08/11/16   Renae Fickle, MD  promethazine (PHENERGAN) 12.5 MG tablet Take 12.5 mg by mouth daily as needed for nausea or vomiting.  10/03/16   [provider]  sevelamer carbonate (RENVELA) 800 MG tablet Take 2 tablets (1,600 mg total) by mouth 3 (three) times daily with meals. 08/11/16   Renae Fickle, MD  vancomycin IVPB Inject into the vein every Monday, Wednesday, and Friday.    [provider]  Vitamin D, Ergocalciferol, (DRISDOL) 50000 UNITS CAPS capsule Take 50,000 Units by mouth every Wednesday.     [provider]    Family History Family History  Problem Relation Age of Onset  . Stroke Mother   . Diabetes Father     Social History Social History  Substance Use Topics  . Smoking status: Never Smoker  . Smokeless tobacco: Current User    Types: Snuff  . Alcohol use No     Allergies     Sulfa antibiotics   Review of Systems Review of Systems  Unable to perform ROS: Mental status change   Physical Exam Updated Vital Signs BP 119/80   Pulse 67   Temp 98.1 F (36.7 C) (Oral)   Resp (!) 8   Ht 5\' 6"  (1.676 m)   Wt 197 lb (89.4 kg)   SpO2 92%   BMI 31.80 kg/m   Physical Exam  Constitutional: He appears well-developed and well-nourished.  HENT:  Head: Normocephalic.  Right Ear: External ear normal.  Left Ear: External ear normal.  Nose: Nose normal.  Eyes: Conjunctivae are normal. Right eye exhibits no discharge. Left eye exhibits no discharge.  Neck: Normal range of motion.  Cardiovascular: Normal rate, regular rhythm and normal heart sounds.   No murmur heard. Pulmonary/Chest: Effort normal. No respiratory distress. He has no wheezes. He has no rales.  Respiratory rate is slow and irregular. Sometimes shallow breaths, sometimes snoring respirations. Dialysis catheter left  chest, externally without signs of infection.  Abdominal: Soft. There is no tenderness. There is no rebound and no guarding.  Musculoskeletal: Normal range of motion. He exhibits no edema or tenderness.  Neurological: No cranial nerve deficit. Coordination normal.  Extremely drowsy. Will awaken to voice and light touch but quickly falls back asleep.  Skin: Skin is warm and dry. No rash noted. No erythema. No pallor.  Nursing note and vitals reviewed.  ED Treatments / Results  Labs (all labs ordered are listed, but only abnormal results are displayed) Labs Reviewed  CBC WITH DIFFERENTIAL/PLATELET - Abnormal; Notable for the following:       Result Value   RBC 3.28 (*)    Hemoglobin 9.9 (*)    HCT 30.3 (*)    RDW 17.3 (*)    Platelets 148 (*)    All other components within normal limits  COMPREHENSIVE METABOLIC PANEL - Abnormal; Notable for the following:    Chloride 99 (*)    BUN 39 (*)    Creatinine, Ser 9.12 (*)    ALT 16 (*)    GFR calc non Af Amer 6 (*)    GFR calc Af Amer  7 (*)    All other components within normal limits  BLOOD GAS, ARTERIAL - Abnormal; Notable for the following:    pH, Arterial 7.295 (*)    pCO2 arterial 62.9 (*)    pO2, Arterial 68.6 (*)    Acid-Base Excess 3.6 (*)    All other components within normal limits  CULTURE, BLOOD (ROUTINE X 2)  CULTURE, BLOOD (ROUTINE X 2)  URINALYSIS, ROUTINE W REFLEX MICROSCOPIC  LACTIC ACID, PLASMA  LACTIC ACID, PLASMA    EKG  EKG Interpretation  Date/Time:  Wednesday November 15 2016 14:54:02 EDT Ventricular Rate:  61 PR Interval:    QRS Duration: 97 QT Interval:  439 QTC Calculation: 443 R Axis:   68 Text Interpretation:  Sinus rhythm Baseline wander in lead(s) II aVF Confirmed by Raeford Razor [phone]) on 11/15/2016 3:30:19 PM       Radiology Dg Chest Portable 1 View  Result Date: 11/15/2016 CLINICAL DATA:  Hypoxemia. Altered mental status. End-stage renal disease, missed dialysis today. EXAM: PORTABLE CHEST 1 VIEW COMPARISON:  Radiograph 11/03/2016 FINDINGS: Left dialysis catheter with tip at the atrial caval junction. Lower lung volumes from prior exam leading to bronchovascular crowding. There is probable pulmonary edema with peribronchial cuffing. Mild cardiomegaly with unchanged mediastinal contours. No large pleural effusion. No pneumothorax. IMPRESSION: Low lung volumes with pulmonary edema and mild cardiomegaly. Electronically Signed   By: Rubye Oaks M.D.   On: 11/15/2016 16:44    Procedures Procedures (including critical care time)  DIAGNOSTIC STUDIES: Oxygen Saturation is 96% on [place], adequate by my interpretation.    COORDINATION OF CARE: 3:10 PM Discussed treatment plan with pt's wife at bedside and she agreed to plan.  Medications Ordered in ED Medications - No data to display   Initial Impression / Assessment and Plan / ED Course  I have reviewed the triage vital signs and the nursing notes.  Pertinent labs & imaging results that were available during my care of the  patient were reviewed by me and considered in my medical decision making (see chart for details).     34:66 PM 41 year old male with decreased mental status. My initial impression is that is probably hypercapnic. His lack of somatic complaints otherwise is consistent with this as well.    He does have a past history  of obstructive sleep apnea and has been on CPAP for several years. His wife reports compliance. On my exam is extremely drowsy but does awaken briefly to voice. His respiratory rate is slow and irregular. He will occasionally pause 7 or 8 seconds in between respirations. Some of his respirations are also shallow and some are more snoring.  No reported trauma. Wife does not have significant concerns or ingestion. Apparently he is being treated with vancomycin at dialysis for catheter associated infection and scheduled for line exchange? I couldn't readily find documentation of this.  He is afebrile. Aside from being drowsy he is nontoxic in appearance. Will check basic labs in addition to an ABG. He did miss dialysis today. Last dialysis on Monday. Reports hx of noncompliance but wife reports has been compliant for the past several weeks and actually had an extra session this past Saturday for some additional volume removal.   Final Clinical Impressions(s) / ED Diagnoses   Final diagnoses:  Altered mental status    New Prescriptions New Prescriptions   No medications on file   I personally preformed the services scribed in my presence. The recorded information has been reviewed is accurate. Raeford Razor, MD.    Raeford Razor, MD 11/24/16 [phone]

## 2016-11-15 NOTE — H&P (Signed)
History and Physical    James Kim:[id number] DOB: July 20, 1975 DOA: 11/15/2016  PCP: Beatriz Stallion, MD   Patient coming from: Home.  I have personally briefly reviewed patient's old medical records in [facility]  Chief Complaint: Confusion, weakness and somnolence for the past 3-4 days.  HPI: James Kim is a 41 y.o. male with medical history significant of anxiety, arthritis, history of childhood asthma, end-stage renal disease on hemodialysis, history of peritoneal dialysis associated peritonitis, GERD, hypertension, OSA on CPAP, medication and HD noncompliance who is brought to the emergency department by his wife due to progressively worse confusion, weakness and somnolence. Patient's wife denies fever, chills, rigors, but states that the patient has been feeling weak. He has been treated with vancomycin after each dialysis for HD catheter related bacteremia since last month. She denies the patient complaining of chest or abdominal pain, nausea, emesis, diarrhea, melena or hematochezia. No history of dysuria, frequency or hematuria. The patient is currently somnolent and unable to provide further history.  ED Course: Initial vital signs emergency department temperature 98.78F, pulse 73, blood pressure 117/90 mmHg, respiratory rate 18 and O2 sat 88% on room air. Dr. Juleen China describes the patient snoring, having shallow breaths with a slow and irregular respiratory rate. The patient had an ABG drawn in the emergency department which showed a pH of 7.295, PCO2 is 62.9 mm from her, PaO2 of 68.6 mmHg, O2 sat 92.4% and bicarbonate of 26.8 mmol/L. He was subsequently started on BiPAP ventilation in the ED showing some clinical improvement since stent per patient's spouse.   Other labs, WBC 6.4, hemoglobin 9.9 g/dL and platelets 811. Sodium is 141, potassium 4.6, chloride 99, bicarbonate 30 and lactic acid is 0.4 mmol/L. His BUN was 39, creatinine 9.12 and glucose 82 mg/dL. His  chest radiograph showed low lung volumes with mild pulmonary edema and cardiomegaly. Blood cultures 2 were drawn.  Review of Systems: As per HPI otherwise 10 point review of systems negative.    Past Medical History:  Diagnosis Date  . Anxiety   . Arthritis    "from my knees down" (08/08/2016)  . Childhood asthma   . 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 Dialysis-associated peritonitis (HCC) 08/08/2016  . ESRD on peritoneal dialysis (HCC)    "01/29/2014-08/07/2016" (08/08/2016)  . GERD (gastroesophageal reflux disease)   . Heel spur    "both feet" (08/08/2016)  . Hypertension   . Noncompliance   . OSA on CPAP   . Pain management    "for pain BLE; knees down thru feet" (08/08/2016)  . Peritoneal dialysis catheter in place [facility])     Past Surgical History:  Procedure Laterality Date  . CAPD REMOVAL N/A 08/10/2016   Procedure: CONTINUOUS AMBULATORY PERITONEAL DIALYSIS  (CAPD) CATHETER REMOVAL;  Surgeon: Claud Kelp, MD;  Location: [facility] OR;  Service: General;  Laterality: N/A;  . DIALYSIS/PERMA CATHETER INSERTION Right 08/03/2016  . INSERTION OF DIALYSIS CATHETER  ~ 2015   peritoneal dialysis     reports that he has never smoked. His smokeless tobacco use includes Snuff. He reports that he does not drink alcohol or use drugs.  Allergies  Allergen Reactions  . Sulfa Antibiotics Anaphylaxis, Itching and Rash    Family History  Problem Relation Age of Onset  . Stroke Mother   . Diabetes Father     Prior to Admission medications   Medication Sig Start Date End Date Taking? Authorizing Provider  allopurinol (ZYLOPRIM)  100 MG tablet Take 100 mg by mouth at bedtime.     [provider]  ALPRAZolam Prudy Feeler) 0.5 MG tablet Take 1 tablet (0.5 mg total) by mouth 2 (two) times daily as needed for anxiety. 11/07/16   Jones Bales, NP  amLODipine (NORVASC) 10 MG tablet Take 1 tablet (10 mg total) by mouth every other day. 10/06/16   Rama, Maryruth Bun, MD  escitalopram  (LEXAPRO) 20 MG tablet Take 20 mg by mouth at bedtime.     [provider]  furosemide (LASIX) 20 MG tablet Take 2 tablets (40 mg total) by mouth 2 (two) times daily. 10/06/16   Rama, Maryruth Bun, MD  metoprolol tartrate (LOPRESSOR) 100 MG tablet Take 1 tablet by mouth daily. 11/07/16   [provider]  omeprazole (PRILOSEC) 20 MG capsule Take 1 capsule by mouth daily. 11/07/16   [provider]  oxyCODONE (OXYCONTIN) 30 MG 12 hr tablet Take 30 mg by mouth 2 (two) times daily. 11/07/16   Jones Bales, NP  oxyCODONE-acetaminophen (PERCOCET) 10-325 MG tablet Take 1 tablet by mouth 5 (five) times daily as needed for pain. 11/07/16   Jones Bales, NP  pantoprazole (PROTONIX) 40 MG tablet Take 40 mg by mouth daily before breakfast.    [provider]  pregabalin (LYRICA) 25 MG capsule Take 1 capsule (25 mg total) by mouth 2 (two) times daily as needed (for fibromyalgia flare-up). 08/11/16   Renae Fickle, MD  promethazine (PHENERGAN) 12.5 MG tablet Take 12.5 mg by mouth daily as needed for nausea or vomiting.  10/03/16   [provider]  sevelamer carbonate (RENVELA) 800 MG tablet Take 2 tablets (1,600 mg total) by mouth 3 (three) times daily with meals. 08/11/16   Renae Fickle, MD  vancomycin IVPB Inject into the vein every Monday, Wednesday, and Friday.    [provider]  Vitamin D, Ergocalciferol, (DRISDOL) 50000 UNITS CAPS capsule Take 50,000 Units by mouth every Wednesday.     [provider]    Physical Exam: Vitals:   11/15/16 1638 11/15/16 1645 11/15/16 1651 11/15/16 1700  BP: 130/74  138/81 129/83  Pulse: 72 80 80 (!) 58  Resp: 16 12 14 11   Temp:      TempSrc:      SpO2: 90% 95% 100% 99%  Weight:      Height:        Constitutional: Somnolent, but otherwise in NAD. Eyes: PERRL, lids and conjunctivae normal ENMT: BiPAP mask on. Neck: normal, supple, no masses, no thyromegaly Respiratory: clear to auscultation  bilaterally, no wheezing, no crackles. Normal respiratory effort. No accessory muscle use.  Chest:Present dialysis catheter on left upper chest area.  Cardiovascular: Regular rate and rhythm, no murmurs / rubs / gallops. No extremity edema. 2+ pedal pulses. No carotid bruits.  Abdomen: Soft, no tenderness, no masses palpated. No hepatosplenomegaly. Bowel sounds positive.  Musculoskeletal: no clubbing / cyanosis. Good ROM, no contractures. Normal muscle tone.  Skin: Small scattered ecchymosis on extremities. Neurologic: Somnolent, moves all extremities, unable to fully evaluate. Psychiatric: Somnolent, but wakes up briefly and answer questions. He is oriented x 2, partially oriented to time and situation.    Labs on Admission: I have personally reviewed following labs and imaging studies  CBC:  Recent Labs Lab 11/15/16 1501  WBC 6.4  NEUTROABS 3.9  HGB 9.9*  HCT 30.3*  MCV 92.4  PLT 148*   Basic Metabolic Panel:  Recent Labs Lab 11/15/16 1501  NA 141  K 4.6  CL 99*  CO2 30  GLUCOSE 82  BUN 39*  CREATININE 9.12*  CALCIUM 9.6   GFR: Estimated Creatinine Clearance: 11.4 mL/min (A) (by C-G formula based on SCr of 9.12 mg/dL (H)). Liver Function Tests:  Recent Labs Lab 11/15/16 1501  AST 19  ALT 16*  ALKPHOS 75  BILITOT 0.6  PROT 7.6  ALBUMIN 3.5   No results for input(s): LIPASE, AMYLASE in the last 168 hours. No results for input(s): AMMONIA in the last 168 hours. Coagulation Profile: No results for input(s): INR, PROTIME in the last 168 hours. Cardiac Enzymes: No results for input(s): CKTOTAL, CKMB, CKMBINDEX, TROPONINI in the last 168 hours. BNP (last 3 results) No results for input(s): PROBNP in the last 8760 hours. HbA1C: No results for input(s): HGBA1C in the last 72 hours. CBG: No results for input(s): GLUCAP in the last 168 hours. Lipid Profile: No results for input(s): CHOL, HDL, LDLCALC, TRIG, CHOLHDL, LDLDIRECT in the last 72 hours. Thyroid  Function Tests: No results for input(s): TSH, T4TOTAL, FREET4, T3FREE, THYROIDAB in the last 72 hours. Anemia Panel: No results for input(s): VITAMINB12, FOLATE, FERRITIN, TIBC, IRON, RETICCTPCT in the last 72 hours. Urine analysis: No results found for: COLORURINE, APPEARANCEUR, LABSPEC, PHURINE, GLUCOSEU, HGBUR, BILIRUBINUR, KETONESUR, PROTEINUR, UROBILINOGEN, NITRITE, LEUKOCYTESUR  Radiological Exams on Admission: Dg Chest Portable 1 View  Result Date: 11/15/2016 CLINICAL DATA:  Hypoxemia. Altered mental status. End-stage renal disease, missed dialysis today. EXAM: PORTABLE CHEST 1 VIEW COMPARISON:  Radiograph 11/03/2016 FINDINGS: Left dialysis catheter with tip at the atrial caval junction. Lower lung volumes from prior exam leading to bronchovascular crowding. There is probable pulmonary edema with peribronchial cuffing. Mild cardiomegaly with unchanged mediastinal contours. No large pleural effusion. No pneumothorax. IMPRESSION: Low lung volumes with pulmonary edema and mild cardiomegaly. Electronically Signed   By: Rubye Oaks M.D.   On: 11/15/2016 16:44    EKG: Independently reviewed. Vent. rate 61 BPM PR interval * ms QRS duration 97 ms QT/QTc 439/443 ms P-RSinus rhythm Baseline wander in lead(s) II aVF-T axes 42 68 54  Assessment/Plan Principal Problem:   Metabolic acidosis with respiratory acidosis Chronic metabolic acidosis seems to be compensated, but the patient and acute respiratory acidosis due to CO2 retention. Per patient's wife, his mental status has improved significantly after BiPAP ventilation was started, although the patient still somnolent, but able to answer questions. Continue BiPAP ventilation and recheck ABG in a.m. Hold alprazolam, oxycodone, pregabalin and other oral meds until mental status improves.  Active Problems:   Altered Mental Status Check CT scan of the head to rule out stroke or other intracranial pathology. Hold oral medications for now  until the patient is more alert. Neuro checks every 4 hours.    End-stage kidney disease (HCC) Last hemodialysis was on Monday. Primary nephrologist Dr. Felipa Furnace in [place], [place]. He is currently getting vancomycin after each hemodialysis apparently for bacteremia. His wife states that he will get a catheter change in the future. Blood cultures 2 pending. Furosemide 40 mg IVP 1 dose for mild fluid overload. Consult nephrology in a.m.    Gout, arthropathy Resume allopurinol in a.m. once patient is more alert.    Rheumatoid arthritis (HCC) Hold Oxycodone at this time due to AMS.    Obstructive sleep apnea of adult Currently on BiPAP Usually on nocturnal CPAP at home.    Essential hypertension Hold oral antihypertensive medications until more alert.    Anemia of chronic disease Monitor hematocrit and hemoglobin. Erythropoietin per  nephrology.    DVT prophylaxis: Heparin SQ Code Status: Full code. Family Communication: His wife Giordan Fordham [phone]) was present in the ICU room Disposition Plan: Admit to ICU to continue BiPAP ventilation and further workup. Consults called: Routine nephrology consult in the morning (Dr. Kristian Covey). Admission status: Inpatient/SDU.   Bobette Mo MD [facility] Pager [phone]  If 7PM-7AM, please contact night-coverage [email or web address] Password [facility]  11/15/2016, 5:37 PM

## 2016-11-15 NOTE — Progress Notes (Signed)
Pt transport to CT on BIPAP and back to ICU 1 with RN and xray tech no problems or distress. Pt vitals within normal limits and RT will continue to monitor through out the night.

## 2016-11-15 NOTE — ED Notes (Signed)
RT aware of abg.

## 2016-11-15 NOTE — ED Notes (Signed)
Pt placed on 2L 02 [place].   

## 2016-11-16 DIAGNOSIS — E874 Mixed disorder of acid-base balance: Secondary | ICD-10-CM

## 2016-11-16 LAB — BLOOD GAS, ARTERIAL
Acid-Base Excess: 0.2 mmol/L (ref 0.0–2.0)
Acid-base deficit: 1.2 mmol/L (ref 0.0–2.0)
BICARBONATE: 22.1 mmol/L (ref 20.0–28.0)
BICARBONATE: 24.3 mmol/L (ref 20.0–28.0)
DELIVERY SYSTEMS: POSITIVE
DRAWN BY: 277331
Delivery systems: POSITIVE
Drawn by: 277331
EXPIRATORY PAP: 8
Expiratory PAP: 6
FIO2: 0.3
FIO2: 0.3
Inspiratory PAP: 14
Inspiratory PAP: 18
O2 Saturation: 98 %
O2 Saturation: 97.2 %
PATIENT TEMPERATURE: 37
PO2 ART: 90.7 mmHg (ref 83.0–108.0)
Patient temperature: 37
RATE: 12 resp/min
RATE: 20 resp/min
pCO2 arterial: 73.8 mmHg (ref 32.0–48.0)
pCO2 arterial: 48.5 mmHg — ABNORMAL HIGH (ref 32.0–48.0)
pH, Arterial: 7.178 — CL (ref 7.350–7.450)
pH, Arterial: 7.338 — ABNORMAL LOW (ref 7.350–7.450)
pO2, Arterial: 125 mmHg — ABNORMAL HIGH (ref 83.0–108.0)

## 2016-11-16 LAB — URINALYSIS, ROUTINE W REFLEX MICROSCOPIC
BACTERIA UA: NONE SEEN
Bilirubin Urine: NEGATIVE
GLUCOSE, UA: 50 mg/dL — AB
Ketones, ur: NEGATIVE mg/dL
LEUKOCYTES UA: NEGATIVE
Nitrite: NEGATIVE
PH: 7 (ref 5.0–8.0)
Protein, ur: >=300 mg/dL — AB
SPECIFIC GRAVITY, URINE: 1.011 (ref 1.005–1.030)

## 2016-11-16 LAB — RENAL FUNCTION PANEL
Albumin: 3.6 g/dL (ref 3.5–5.0)
Anion gap: 13 (ref 5–15)
BUN: 43 mg/dL — ABNORMAL HIGH (ref 6–20)
CHLORIDE: 98 mmol/L — AB (ref 101–111)
CO2: 31 mmol/L (ref 22–32)
CREATININE: 10.73 mg/dL — AB (ref 0.61–1.24)
Calcium: 9.5 mg/dL (ref 8.9–10.3)
GFR calc Af Amer: 6 mL/min — ABNORMAL LOW (ref >60)
GFR calc non Af Amer: 5 mL/min — ABNORMAL LOW (ref >60)
Glucose, Bld: 106 mg/dL — ABNORMAL HIGH (ref 65–99)
Phosphorus: 12.2 mg/dL — ABNORMAL HIGH (ref 2.5–4.6)
Potassium: 5.1 mmol/L (ref 3.5–5.1)
Sodium: 142 mmol/L (ref 135–145)

## 2016-11-16 LAB — CBC
HCT: 33.1 % — ABNORMAL LOW (ref 39.0–52.0)
Hemoglobin: 10.3 g/dL — ABNORMAL LOW (ref 13.0–17.0)
MCH: 29.6 pg (ref 26.0–34.0)
MCHC: 31.1 g/dL (ref 30.0–36.0)
MCV: 95.1 fL (ref 78.0–100.0)
PLATELETS: 143 10*3/uL — AB (ref 150–400)
RBC: 3.48 MIL/uL — ABNORMAL LOW (ref 4.22–5.81)
RDW: 17.1 % — AB (ref 11.5–15.5)
WBC: 8.7 10*3/uL (ref 4.0–10.5)

## 2016-11-16 LAB — RAPID URINE DRUG SCREEN, HOSP PERFORMED
AMPHETAMINES: NOT DETECTED
BENZODIAZEPINES: POSITIVE — AB
Barbiturates: NOT DETECTED
Cocaine: NOT DETECTED
OPIATES: POSITIVE — AB
Tetrahydrocannabinol: NOT DETECTED

## 2016-11-16 LAB — MRSA PCR SCREENING: MRSA BY PCR: NEGATIVE

## 2016-11-16 LAB — ETHANOL: Alcohol, Ethyl (B): <5 mg/dL (ref <5)

## 2016-11-16 MED ORDER — GENTAMICIN IN SALINE 1.6-0.9 MG/ML-% IV SOLN
80.0000 mg | INTRAVENOUS | Status: DC | PRN
  Administered 2016-11-16: 80 mg via INTRAVENOUS
  Filled 2016-11-16: qty 50

## 2016-11-16 MED ORDER — SEVELAMER CARBONATE 800 MG PO TABS
1600.0000 mg | ORAL_TABLET | Freq: Three times a day (TID) | ORAL | Status: DC
  Administered 2016-11-17 – 2016-11-18 (×3): 1600 mg via ORAL
  Filled 2016-11-16 (×5): qty 2

## 2016-11-16 MED ORDER — VANCOMYCIN HCL IN DEXTROSE 1-5 GM/200ML-% IV SOLN: 1000.0000 mg | Freq: Once | INTRAVENOUS | Status: DC

## 2016-11-16 MED ORDER — EPOETIN ALFA 2000 UNIT/ML IJ SOLN
2000.0000 [IU] | INTRAMUSCULAR | Status: DC
  Administered 2016-11-18: 2000 [IU] via SUBCUTANEOUS
  Filled 2016-11-16: qty 1

## 2016-11-16 MED ORDER — HEPARIN SODIUM (PORCINE) 1000 UNIT/ML DIALYSIS
1000.0000 [IU] | INTRAMUSCULAR | Status: DC | PRN
  Administered 2016-11-16 – 2016-11-18 (×2): 1000 [IU] via INTRAVENOUS_CENTRAL
  Filled 2016-11-16 (×3): qty 1

## 2016-11-16 MED ORDER — ALTEPLASE 2 MG IJ SOLR
2.0000 mg | Freq: Once | INTRAMUSCULAR | Status: DC | PRN
  Filled 2016-11-16: qty 2

## 2016-11-16 MED ORDER — SODIUM CHLORIDE 0.9 % IV SOLN: 100.0000 mL | INTRAVENOUS | Status: DC | PRN

## 2016-11-16 MED ORDER — NALOXONE HCL 2 MG/2ML IJ SOSY: 1.0000 mg | PREFILLED_SYRINGE | Freq: Once | INTRAMUSCULAR | Status: DC

## 2016-11-16 MED ORDER — CHLORHEXIDINE GLUCONATE 0.12 % MT SOLN
15.0000 mL | Freq: Two times a day (BID) | OROMUCOSAL | Status: DC
  Administered 2016-11-16 – 2016-11-18 (×3): 15 mL via OROMUCOSAL
  Filled 2016-11-16 (×3): qty 15

## 2016-11-16 MED ORDER — ORAL CARE MOUTH RINSE: 15.0000 mL | Freq: Two times a day (BID) | OROMUCOSAL | Status: DC

## 2016-11-16 MED ORDER — LIDOCAINE HCL (PF) 1 % IJ SOLN: 5.0000 mL | INTRAMUSCULAR | Status: DC | PRN

## 2016-11-16 MED ORDER — PENTAFLUOROPROP-TETRAFLUOROETH EX AERO
1.0000 "application " | INHALATION_SPRAY | CUTANEOUS | Status: DC | PRN
  Filled 2016-11-16: qty 30

## 2016-11-16 MED ORDER — LORAZEPAM 2 MG/ML IJ SOLN
INTRAMUSCULAR | Status: AC
  Administered 2016-11-16: 1 mg
  Filled 2016-11-16: qty 1

## 2016-11-16 MED ORDER — NALOXONE HCL 2 MG/2ML IJ SOSY
PREFILLED_SYRINGE | INTRAMUSCULAR | Status: AC
  Filled 2016-11-16: qty 2

## 2016-11-16 MED ORDER — HEPARIN SODIUM (PORCINE) 1000 UNIT/ML DIALYSIS
20.0000 [IU]/kg | INTRAMUSCULAR | Status: DC | PRN
  Filled 2016-11-16: qty 2

## 2016-11-16 MED ORDER — SODIUM CHLORIDE 0.9 % IV SOLN
100.0000 mL | INTRAVENOUS | Status: DC | PRN
Start: 2016-11-16 — End: 2016-11-18

## 2016-11-16 MED ORDER — NALOXONE HCL 0.4 MG/ML IJ SOLN: 0.4000 mg | INTRAMUSCULAR | Status: DC

## 2016-11-16 MED ORDER — LIDOCAINE-PRILOCAINE 2.5-2.5 % EX CREA
1.0000 "application " | TOPICAL_CREAM | CUTANEOUS | Status: DC | PRN
  Filled 2016-11-16: qty 5

## 2016-11-16 MED ORDER — NALOXONE HCL 2 MG/2ML IJ SOSY
1.0000 mg | PREFILLED_SYRINGE | INTRAMUSCULAR | Status: AC
  Administered 2016-11-16: 1 mg via INTRAVENOUS
  Filled 2016-11-16: qty 2

## 2016-11-16 NOTE — Plan of Care (Signed)
Problem: Safety: Goal: Ability to remain free from injury will improve Outcome: Progressing Call light within in reach; bed alarm on; bed in lowest position; belongings within reach  Problem: Tissue Perfusion: Goal: Risk factors for ineffective tissue perfusion will decrease Outcome: Progressing Pt is receiving subq Heparin

## 2016-11-16 NOTE — Progress Notes (Signed)
Narcan was given to the the patient at approximately 1115 this am. About a min after the patient started waking up, asking [place] to call his wife. Dr. Mariea Clonts was at the desk and heard him talking and quickly came in the room. Informed her that the narcan was given and was successful. Left the room and came to the desk, and about 10 min later the montiro showed that his pulse was in the 130s and his O2 was censor wasn't picking up. Came into the room, he was yawning frequent and tremoring. Dr. Patrick Jupiter was still at the desk, had her come see the patient. She ordered Ativan 1mg  in case the patient was having a seizure, which was given at that time. The ativan was no effective bu the tremors did slowly decrease over the next 5 minutes. Dr. suspected that it was just withdrawal symptoms from giving the narcan and decided just to monitor the patient at this time.

## 2016-11-16 NOTE — Progress Notes (Signed)
PROGRESS NOTE    James Kim  CBS:[id number] DOB: 09-24-1975 DOA: 11/15/2016 PCP: Salomon Mast, MD   Brief Narrative: James Kim is a 41 y.o. male with medical history significant of anxiety, arthritis, history of childhood asthma, end-stage renal disease on hemodialysis, history of peritoneal dialysis associated peritonitis, GERD, hypertension, OSA on CPAP, medication and HD noncompliance who is brought to the emergency department by his wife due to progressively worse confusion, weakness and somnolence. Patient's wife denies fever, chills, rigors, but states that the patient has been feeling weak. He has been treated with vancomycin after each dialysis for HD catheter related bacteremia since last month. ED- temperature 98.42F, pulse 73, blood pressure 117/90 mmHg, respiratory rate 18 and O2 sat 88% on room air. Dr. Juleen China describes the patient snoring, having shallow breaths with a slow and irregular respiratory rate. The patient had an ABG drawn in the emergency department which showed a pH of 7.295, PCO2 is 62.9 mm from her, PaO2 of 68.6 mmHg, O2 sat 92.4% and bicarbonate of 26.8 mmol/L. He was subsequently started on BiPAP ventilation in the ED with clinical improvement. His chest radiograph showed low lung volumes with mild pulmonary edema and cardiomegaly. Blood cultures 2 were drawn.  Assessment & Plan:   Principal Problem:   Metabolic acidosis with respiratory acidosis Active Problems:   Gout, arthropathy   Rheumatoid arthritis (HCC)   Obstructive sleep apnea of adult   End stage kidney disease (HCC)   Essential hypertension   Anemia of chronic disease   Altered mental status  Metabolic acidosis with respiratory acidosis- likely 2/2 opioid overdose. Somnolent this am, difficult to arouse. Home meds list both- opioids and benzos.  He was taken off BIPAP earlier this am. Also pulm edema on chest xray possible contributing to Acidosis and respiratory status. - UDS  pending.  - Stat ABG- Ph low 7.17, Pco2  Elevated at 73. - Gave a trial of Naloxone 1 mg- patient immediately responded and was awake and alert, then started shivering significantly with yawning.  - Pt most likely went into short opioid withdrawal, after nalozone - urgent nephrology consult  - BIPAP now, repeat ABG in 1 hr. - Hold all sedative home meds- Hold alprazolam, oxycodone, pregabalin  - Keep in ICU till respiratory status improves, and patient clears opioids.  Metabolic encephalopathy- likely from severe metabolic acidosis, and opioid overdose.- Ct head negative .  - B/c and urine cultures pending - Erronously re-ordered and redrawn 7/12 -Hold oral medications for now until the patient is more alert.   End-stage kidney disease (HCC)- MWF. Last HD- Monday. Pt was given a dose of lasix 40mg  x1 yesterday. Primary nephrologist Dr. in [place], [place]. He is currently getting vancomycin after each hemodialysis apparently for bacteremia. His wife states that he will get a catheter change in the future.  Urgent nephrology consult.   Gout, arthropathy Resume allopurinol in a.m. once patient is more alert.   Rheumatoid arthritis (HCC) Hold Oxycodone at this time due to AMS, opioid overdose. Would d/c opioids on discharge   Obstructive sleep apnea of adult Currently on BiPAP Usually on nocturnal CPAP at home.    Essential hypertension- Stable.  Hold oral antihypertensive medications until more alert. Metop 5mg  IV    Anemia of chronic disease Monitor hematocrit and hemoglobin. Erythropoietin per nephrology.  DVT prophylaxis: Heparin SQ Code Status: Full code. Family Communication: His wife Nayan Proch [phone]) - not present. Disposition Plan: Pending improvement in mental status and nephrology eval..  Consults called: Routine nephrology consult Admission status: Inpatient/SDU.  Procedures: None  Antimicrobials: None   Subjective: Pt was  initially not responding to verbal or tactile stimuli, but with normal O2 sats, heart rate- regular rate and rhythm. Was given trial of nalozone- 1mg , with immediate and significant response, pt was awake and alert, then patient started shivering significantly- appeared to be jerking motion, would not respond to sternal rub and yawning. He was given ativan 1 mg. He subsequently calmed down, yawned several times, and woke up. He responded to simple questions while on Bipap.   Objective: Vitals:   11/16/16 0709 11/16/16 0719 11/16/16 0800 11/16/16 0908  BP:  139/81 136/80 125/71  Pulse: 97 (!) 102 (!) 106 (!) 113  Resp: 14 15 (!) 9 14  Temp: 98.7 F (37.1 C)     TempSrc: Axillary     SpO2: 93% 96% 92% 100%  Weight:      Height:        Intake/Output Summary (Last 24 hours) at 11/16/16 1054 Last data filed at 11/16/16 0614  Gross per 24 hour  Intake                0 ml  Output              200 ml  Net             -200 ml   Filed Weights   11/15/16 1446 11/15/16 1809 11/16/16 0455  Weight: 89.4 kg (197 lb) 86.8 kg (191 lb 5.8 oz) 88.1 kg (194 lb 3.6 oz)    Examination:  General exam:Initially somnolent, then woke up with naloxone, was awake and alert, then started shivering and yawning. Pupils were dilated before nalozone. Respiratory system: Clear to auscultation. On BIPAP Cardiovascular system: S1 & S2 heard, RRR. No JVD, murmurs, rubs, gallops or clicks. No pedal edema. Gastrointestinal system: Abdomen is nondistended, soft and nontender. No organomegaly or masses felt. Normal bowel sounds heard. Central nervous system: somnolent initially, now improved, awake and alert. Moving all extremities spontaneously. Extremities: Symmetric 5 x 5 power. Skin: No rashes, lesions or ulcers Psychiatry: Judgement and insight appear normal. Mood & affect appropriate.   Data Reviewed: I have personally reviewed following labs and imaging studies  CBC:  Recent Labs Lab 11/15/16 1501  11/16/16 0431  WBC 6.4 8.7  NEUTROABS 3.9  --   HGB 9.9* 10.3*  HCT 30.3* 33.1*  MCV 92.4 95.1  PLT 148* 143*   Basic Metabolic Panel:  Recent Labs Lab 11/15/16 1501 11/16/16 0431  NA 141 142  K 4.6 5.1  CL 99* 98*  CO2 30 31  GLUCOSE 82 106*  BUN 39* 43*  CREATININE 9.12* 10.73*  CALCIUM 9.6 9.5  PHOS  --  12.2*   Liver Function Tests:  Recent Labs Lab 11/15/16 1501 11/16/16 0431  AST 19  --   ALT 16*  --   ALKPHOS 75  --   BILITOT 0.6  --   PROT 7.6  --   ALBUMIN 3.5 3.6   Sepsis Labs:  Recent Labs Lab 11/15/16 1640 11/15/16 1910  LATICACIDVEN 0.4* 0.5    Recent Results (from the past 240 hour(s))  Blood culture (routine x 2)     Status: None (Preliminary result)   Collection Time: 11/15/16  4:50 PM  Result Value Ref Range Status   Specimen Description BLOOD RIGHT HAND  Final   Special Requests   Final    BOTTLES DRAWN AEROBIC AND ANAEROBIC  Blood Culture adequate volume   Culture NO GROWTH < 24 HOURS  Final   Report Status PENDING  Incomplete  Blood culture (routine x 2)     Status: None (Preliminary result)   Collection Time: 11/15/16  5:00 PM  Result Value Ref Range Status   Specimen Description BLOOD LEFT HAND  Final   Special Requests   Final    BOTTLES DRAWN AEROBIC AND ANAEROBIC Blood Culture adequate volume   Culture NO GROWTH < 24 HOURS  Final   Report Status PENDING  Incomplete     Radiology Studies: Ct Head Wo Contrast  Result Date: 11/15/2016 CLINICAL DATA:  Altered mental status. Increased confusion and weakness. EXAM: CT HEAD WITHOUT CONTRAST TECHNIQUE: Contiguous axial images were obtained from the base of the skull through the vertex without intravenous contrast. COMPARISON:  None. FINDINGS: Brain: No evidence of acute infarction, hemorrhage, hydrocephalus, extra-axial collection or mass lesion/mass effect. Vascular: No hyperdense vessel. Skull: No skull fracture or focal lesion. Sinuses/Orbits: Paranasal sinuses and mastoid air  cells are clear. The visualized orbits are unremarkable. Other: None. IMPRESSION: No acute intracranial abnormality. Electronically Signed   By: Rubye Oaks M.D.   On: 11/15/2016 20:46   Dg Chest Portable 1 View  Result Date: 11/15/2016 CLINICAL DATA:  Hypoxemia. Altered mental status. End-stage renal disease, missed dialysis today. EXAM: PORTABLE CHEST 1 VIEW COMPARISON:  Radiograph 11/03/2016 FINDINGS: Left dialysis catheter with tip at the atrial caval junction. Lower lung volumes from prior exam leading to bronchovascular crowding. There is probable pulmonary edema with peribronchial cuffing. Mild cardiomegaly with unchanged mediastinal contours. No large pleural effusion. No pneumothorax. IMPRESSION: Low lung volumes with pulmonary edema and mild cardiomegaly. Electronically Signed   By: Rubye Oaks M.D.   On: 11/15/2016 16:44   Scheduled Meds: . heparin  5,000 Units Subcutaneous Q8H  . metoprolol tartrate  5 mg Intravenous Q8H  . naLOXone (NARCAN)  injection  1 mg Intravenous STAT   Continuous Infusions:   LOS: 1 day   Onnie Boer, MD [facility] Pager [phone]  If 7PM-7AM, please contact night-coverage [email or web address] Password [facility] 11/16/2016, 10:54 AM

## 2016-11-16 NOTE — Progress Notes (Signed)
**Note De-Identified Stephenie Navejas Obfuscation** Per ABG BIPAP settings changed to 18/8, RR 20.   RRT to continue to monitor and repeat ABG in one hour.

## 2016-11-16 NOTE — Progress Notes (Signed)
**Note De-Identified James Kim Obfuscation** Patient removed from BIPAP and placed on 4 L [place]; will titrate as tolerated.  RRT to continue to monitor.

## 2016-11-16 NOTE — Consult Note (Addendum)
James Kim MRN: [id number] DOB/AGE: 39-Dec-1977 39 y.o. Primary Care Physician:[name], [name], MD Admit date: 11/15/2016 Chief Complaint:  Chief Complaint  Patient presents with  . Altered Mental Status   HPI: Pt is a 41 year old malewith past  medical history Anxiety,End-stage renal disease on hemodialysis, history of peritoneal dialysis associated peritonitisand HD nonadherence  who was brought to the ED by his spouse  due to progressively worsening confusion, weakness and somnolence.   Upon evaluation in ER patient was found to be snoring and  having shallow breaths with a slow and irregular respiratory rate. The patient had an ABG drawn in the emergency department which showed a pH of 7.295, PCO2 is 62.9 mm from her, PaO2 of 68.6 mmHg, O2 sat 92.4% and bicarbonate of 26.8 mmol/L. Pt was  started on BiPAP. Pt was and admitted to ICU.  Pt offer NO c/o fever/ chills/ rigors, but states that the patient has been feeling weak.  During the review of chart I noticed that- As per wife pt  has been treated with vancomycin after each dialysis for HD catheter related bacteremia since last month.  Past Medical History:  Diagnosis Date  . Anxiety   . Arthritis    "from my knees down" (08/08/2016)  . Childhood asthma   . 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 Dialysis-associated peritonitis (HCC) 08/08/2016  . ESRD on peritoneal dialysis (HCC)    "01/29/2014-08/07/2016" (08/08/2016)  . GERD (gastroesophageal reflux disease)   . Heel spur    "both feet" (08/08/2016)  . Hypertension   . Noncompliance   . OSA on CPAP   . Pain management    "for pain BLE; knees down thru feet" (08/08/2016)  . Peritoneal dialysis catheter in place [facility])         Family History  Problem Relation Age of Onset  . Stroke Mother   . Diabetes Father     Social History:  reports that he has never smoked. His smokeless tobacco use includes Snuff. He reports that he does not drink alcohol or use  drugs.   Allergies:  Allergies  Allergen Reactions  . Sulfa Antibiotics Anaphylaxis, Itching and Rash    Medications Prior to Admission  Medication Sig Dispense Refill  . allopurinol (ZYLOPRIM) 100 MG tablet Take 100 mg by mouth at bedtime.     . ALPRAZolam (XANAX) 0.5 MG tablet Take 1 tablet (0.5 mg total) by mouth 2 (two) times daily as needed for anxiety. 60 tablet 2  . amLODipine (NORVASC) 10 MG tablet Take 1 tablet (10 mg total) by mouth every other day. 30 tablet 2  . escitalopram (LEXAPRO) 20 MG tablet Take 20 mg by mouth at bedtime.     . furosemide (LASIX) 20 MG tablet Take 2 tablets (40 mg total) by mouth 2 (two) times daily. 60 tablet 2  . metoprolol tartrate (LOPRESSOR) 100 MG tablet Take 1 tablet by mouth daily.    Marland Kitchen omeprazole (PRILOSEC) 20 MG capsule Take 1 capsule by mouth daily.    Marland Kitchen oxyCODONE (OXYCONTIN) 30 MG 12 hr tablet Take 30 mg by mouth 2 (two) times daily. 60 each 0  . oxyCODONE-acetaminophen (PERCOCET) 10-325 MG tablet Take 1 tablet by mouth 5 (five) times daily as needed for pain. 150 tablet 0  . pantoprazole (PROTONIX) 40 MG tablet Take 40 mg by mouth daily before breakfast.    . pregabalin (LYRICA) 25 MG capsule Take 1 capsule (25 mg total) by mouth 2 (  two) times daily as needed (for fibromyalgia flare-up).    . promethazine (PHENERGAN) 12.5 MG tablet Take 12.5 mg by mouth daily as needed for nausea or vomiting.     . sevelamer carbonate (RENVELA) 800 MG tablet Take 2 tablets (1,600 mg total) by mouth 3 (three) times daily with meals. 180 tablet 0  . vancomycin IVPB Inject into the vein every Monday, Wednesday, and Friday.    . Vitamin D, Ergocalciferol, (DRISDOL) 50000 UNITS CAPS capsule Take 50,000 Units by mouth every Wednesday.          ROS:on Bipap, does not offer any complaints.  . heparin  5,000 Units Subcutaneous Q8H  . metoprolol tartrate  5 mg Intravenous Q8H         Physical Exam: Vital signs in last 24 hours: Temp:  [97.5 F (36.4  C)-98.7 F (37.1 C)] 98.6 F (37 C) (07/12 1109) Pulse Rate:  [58-145] 92 (07/12 1400) Resp:  [7-21] 11 (07/12 1400) BP: (117-157)/(57-97) 142/96 (07/12 1400) SpO2:  [88 %-100 %] 100 % (07/12 1400) FiO2 (%):  [40 %] 40 % (07/12 0500) Weight:  [191 lb 5.8 oz (86.8 kg)-197 lb (89.4 kg)] 194 lb 3.6 oz (88.1 kg) (07/12 0455) Weight change:  Last BM Date: 11/15/16  Intake/Output from previous day: 07/11 0701 - 07/12 0700 In: -  Out: 200 [Urine:200] Total I/O In: -  Out: 350 [Urine:350]   Physical Exam: General- pt is awake,alert, follows commands Resp- Bipap in situ, Rhonchi+ CVS- S1S2 regular in rate and rhythm GIT- BS+, soft, NT, ND EXT- NO LE Edema, No Cyanosis CNS- CN 2-12 grossly intact. Moving all 4 extremities Access- PC    Lab Results: CBC  Recent Labs  11/15/16 1501 11/16/16 0431  WBC 6.4 8.7  HGB 9.9* 10.3*  HCT 30.3* 33.1*  PLT 148* 143*    BMET  Recent Labs  11/15/16 1501 11/16/16 0431  NA 141 142  K 4.6 5.1  CL 99* 98*  CO2 30 31  GLUCOSE 82 106*  BUN 39* 43*  CREATININE 9.12* 10.73*  CALCIUM 9.6 9.5    MICRO Recent Results (from the past 240 hour(s))  Blood culture (routine x 2)     Status: None (Preliminary result)   Collection Time: 11/15/16  4:50 PM  Result Value Ref Range Status   Specimen Description BLOOD RIGHT HAND  Final   Special Requests   Final    BOTTLES DRAWN AEROBIC AND ANAEROBIC Blood Culture adequate volume   Culture NO GROWTH < 24 HOURS  Final   Report Status PENDING  Incomplete  Blood culture (routine x 2)     Status: None (Preliminary result)   Collection Time: 11/15/16  5:00 PM  Result Value Ref Range Status   Specimen Description BLOOD LEFT HAND  Final   Special Requests   Final    BOTTLES DRAWN AEROBIC AND ANAEROBIC Blood Culture adequate volume   Culture NO GROWTH < 24 HOURS  Final   Report Status PENDING  Incomplete  MRSA PCR Screening     Status: None   Collection Time: 11/15/16  6:10 PM  Result Value  Ref Range Status   MRSA by PCR NEGATIVE NEGATIVE Final    Comment:        The GeneXpert MRSA Assay (FDA approved for NASAL specimens only), is one component of a comprehensive MRSA colonization surveillance program. It is not intended to diagnose MRSA infection nor to guide or monitor treatment for MRSA infections.  Lab Results  Component Value Date   CALCIUM 9.5 11/16/2016   PHOS 12.2 (H) 11/16/2016      Impression: 1)Renal  ESRD on HD                Pt is on MWF schedule as outpt                Pt last HD was on Monday                Will dialyze today 2)HTN Medication- On Diuretics- On Beta blockers    3)Anemia in ESRD the goal for  HGb is 9--11. Will keep on epo during HD  4)CKD Mineral-Bone Disorder Phosphorus not  at goal.   Will start binders  Calcium is at goal  5)Resp- admitted woth acute resp failure Pt was started on Bipap Now off, clinically better Primary  MD following  6)Electrolytes Normokalemic NOrmonatremic   7)Acid base Co2 at goal  8) ID -Pt on on IV ABX sec to Bacteremia Will get in touch with his unit to see what dose and for how long   Plan:  Will dialyze today Will keep on epo Will use 2 k bath Will keep on vanco for now Will get in touch with his unit to see what dose and for how long Will start on phos binders   Addendum We got in touch with dialysis unit and upon review of the cultures. Pt had Gram Negative bacteremia Pt was on gentamycin . Will d/c vanco Will start gentamycin     Junelle Hashemi S 11/16/2016, 2:30 PM

## 2016-11-16 NOTE — Progress Notes (Signed)
Nurse taken BIPAP off for patient to eat at 2130 and placed on nasal cannula. Attempted to put pt back on BIPAP and pt complain that the BIPAP is making him panic and took it off and refuse to wear and want to wear the [place] for the rest of the night. Nurse informed and rt will continue to monitor.

## 2016-11-16 NOTE — Care Management Note (Signed)
Case Management Note  Patient Details  Name: ADOLPHO MEENACH MRN: [id number] Date of Birth: 12-Dec-1975  Subjective/Objective:  Chart reviewed. Adm with metabolic acidosis with respiratory acidosis. Currently placed back on Bipap, worsening ABG. From home with wife, CPAP at home, ESRD on HD, medication and HD noncompliance per chart. Being treated for bactermia with vanc at HD session. Primary nephrologist in [place], [place].             Action/Plan: CM following for needs.   Expected Discharge Date:     11/18/2016             Expected Discharge Plan:  Home/Self Care  In-House Referral:     Discharge planning Services  CM Consult  Post Acute Care Choice:    Choice offered to:     DME Arranged:    DME Agency:     HH Arranged:    HH Agency:     Status of Service:  In process, will continue to follow  If discussed at Long Length of Stay Meetings, dates discussed:    Additional Comments:  Alliana Mcauliff, Chrystine Oiler, RN 11/16/2016, 11:46 AM

## 2016-11-17 LAB — HEPATITIS B SURFACE ANTIGEN: Hepatitis B Surface Ag: NEGATIVE

## 2016-11-17 MED ORDER — ESCITALOPRAM OXALATE 10 MG PO TABS
20.0000 mg | ORAL_TABLET | Freq: Every day | ORAL | Status: DC
  Administered 2016-11-17: 20 mg via ORAL
  Filled 2016-11-17: qty 2

## 2016-11-17 MED ORDER — PANTOPRAZOLE SODIUM 40 MG PO TBEC
40.0000 mg | DELAYED_RELEASE_TABLET | Freq: Every day | ORAL | Status: DC
  Administered 2016-11-18: 40 mg via ORAL
  Filled 2016-11-17: qty 1

## 2016-11-17 MED ORDER — METOPROLOL TARTRATE 50 MG PO TABS
100.0000 mg | ORAL_TABLET | Freq: Every day | ORAL | Status: DC
  Administered 2016-11-17 – 2016-11-18 (×2): 100 mg via ORAL
  Filled 2016-11-17 (×2): qty 2

## 2016-11-17 MED ORDER — ALLOPURINOL 100 MG PO TABS
100.0000 mg | ORAL_TABLET | ORAL | Status: DC
  Administered 2016-11-17: 100 mg via ORAL
  Filled 2016-11-17: qty 1

## 2016-11-17 MED ORDER — AMLODIPINE BESYLATE 5 MG PO TABS
10.0000 mg | ORAL_TABLET | ORAL | Status: DC
  Administered 2016-11-17: 10 mg via ORAL
  Filled 2016-11-17 (×2): qty 2

## 2016-11-17 MED ORDER — SEVELAMER CARBONATE 800 MG PO TABS: 1600.0000 mg | ORAL_TABLET | Freq: Three times a day (TID) | ORAL | Status: DC

## 2016-11-17 NOTE — Progress Notes (Signed)
PROGRESS NOTE    James Kim  ZYY:[id number] DOB: 08-Jan-1976 DOA: 11/15/2016 PCP: James Mast, MD   Brief Narrative: James Kim is a 41 y.o. male with medical history significant of anxiety, arthritis, history of childhood asthma, end-stage renal disease on hemodialysis, history of peritoneal dialysis associated peritonitis, GERD, hypertension, OSA on CPAP, medication and HD noncompliance who is brought to the emergency department by his wife due to progressively worse confusion, weakness and somnolence. Patient's wife denies fever, chills, rigors, but states that the patient has been feeling weak. He has been treated with vancomycin after each dialysis for HD catheter related bacteremia since last month. ED- temperature 98.57F, pulse 73, blood pressure 117/90 mmHg, respiratory rate 18 and O2 sat 88% on room air. Dr. Juleen Kim describes the patient snoring, having shallow breaths with a slow and irregular respiratory rate. The patient had an ABG drawn in the emergency department which showed a pH of 7.295, PCO2 is 62.9 mm from her, PaO2 of 68.6 mmHg, O2 sat 92.4% and bicarbonate of 26.8 mmol/L. He was subsequently started on BiPAP ventilation in the ED with clinical improvement. His chest radiograph showed low lung volumes with mild pulmonary edema and cardiomegaly. Blood cultures 2 were drawn.  Assessment & Plan:   Principal Problem:   Metabolic acidosis with respiratory acidosis Active Problems:   Gout, arthropathy   Rheumatoid arthritis (HCC)   Obstructive sleep apnea of adult   End stage kidney disease (HCC)   Essential hypertension   Anemia of chronic disease   Altered mental status  Metabolic acidosis with respiratory acidosis- likely 2/2 opioid overdose. Resolved. - Had serous and in-depth conversation about patient's opioid use, recommendation that he should not be on this medication considering he is end-stage renal disease status and should consider seeking definitive  sub-specialist evaluation to address cause of pain - Transfer to telemetry - likely DC a.m.  Metabolic encephalopathy- likely from severe metabolic acidosis, and opioid overdose.- Resolved . Ct head negative .  - B/c - no growth in 2 days  -  urine cultures- culture pending sample with patient's ESRD status   End-stage kidney disease (HCC)- MWF. Primary nephrologist James Kim in [place], [place]. Patient gets and gentamicin for bacteremia 2 HD sessions this admission DC a.m.   Gout, arthropathy Resume allopurinol in a.m, adjusts dose for HD status   Rheumatoid arthritis (HCC) Patient I will recommend d/c opioids on discharge, though he probably has some at home   Obstructive sleep apnea of adult Nocturnal CPAP .    Essential hypertension-  - Resume home antihypertensives    Anemia of chronic disease Monitor hematocrit and hemoglobin. Erythropoietin per nephrology.  DVT prophylaxis: Heparin SQ Code Status: Full code. Family Communication: His wife James Kim [phone]) - not present. Disposition Plan:  d/c home a.m. Consults called: Routine nephrology consult Admission status: Inpatient/SDU.  Procedures: None  Antimicrobials: None   Subjective: Awake and alert moving around in room.   Objective: Vitals:   11/17/16 0800 11/17/16 0913 11/17/16 1200 11/17/16 1600  BP: (!) 159/88     Pulse: 70     Resp: 10     Temp: 98.3 F (36.8 C)  98.4 F (36.9 C) 98.5 F (36.9 C)  TempSrc: Oral  Oral Oral  SpO2: 100% 98%    Weight:      Height:        Intake/Output Summary (Last 24 hours) at 11/17/16 1945 Last data filed at 11/17/16 1700  Gross per 24 hour  Intake              260 ml  Output             2001 ml  Net            -1741 ml   Filed Weights   11/16/16 0455 11/16/16 1630 11/17/16 0458  Weight: 88.1 kg (194 lb 3.6 oz) 88.1 kg (194 lb 3.6 oz) 85 kg (187 lb 6.3 oz)    Examination:  General exam:Alert and oriented.  Respiratory system:  Clear to auscultation.  Cardiovascular system: S1 & S2 heard, RRR. No JVD, murmurs, rubs, gallops or clicks. No pedal edema. Gastrointestinal system: Abdomen is nondistended, soft and nontender. No organomegaly or masses felt. Normal bowel sounds heard. Central nervous system: A and O x3 Extremities: Symmetric 5 x 5 power. Skin: No rashes, lesions or ulcers Psychiatry: Judgement and insight appear normal. Mood & affect appropriate.   Data Reviewed: I have personally reviewed following labs and imaging studies  CBC:  Recent Labs Lab 11/15/16 1501 11/16/16 0431  WBC 6.4 8.7  NEUTROABS 3.9  --   HGB 9.9* 10.3*  HCT 30.3* 33.1*  MCV 92.4 95.1  PLT 148* 143*   Basic Metabolic Panel:  Recent Labs Lab 11/15/16 1501 11/16/16 0431  NA 141 142  K 4.6 5.1  CL 99* 98*  CO2 30 31  GLUCOSE 82 106*  BUN 39* 43*  CREATININE 9.12* 10.73*  CALCIUM 9.6 9.5  PHOS  --  12.2*   Liver Function Tests:  Recent Labs Lab 11/15/16 1501 11/16/16 0431  AST 19  --   ALT 16*  --   ALKPHOS 75  --   BILITOT 0.6  --   PROT 7.6  --   ALBUMIN 3.5 3.6   Sepsis Labs:  Recent Labs Lab 11/15/16 1640 11/15/16 1910  LATICACIDVEN 0.4* 0.5    Recent Results (from the past 240 hour(s))  Blood culture (routine x 2)     Status: None (Preliminary result)   Collection Time: 11/15/16  4:50 PM  Result Value Ref Range Status   Specimen Description BLOOD RIGHT HAND  Final   Special Requests   Final    BOTTLES DRAWN AEROBIC AND ANAEROBIC Blood Culture adequate volume   Culture NO GROWTH 2 DAYS  Final   Report Status PENDING  Incomplete  Blood culture (routine x 2)     Status: None (Preliminary result)   Collection Time: 11/15/16  5:00 PM  Result Value Ref Range Status   Specimen Description BLOOD LEFT HAND  Final   Special Requests   Final    BOTTLES DRAWN AEROBIC AND ANAEROBIC Blood Culture adequate volume   Culture NO GROWTH 2 DAYS  Final   Report Status PENDING  Incomplete  MRSA PCR  Screening     Status: None   Collection Time: 11/15/16  6:10 PM  Result Value Ref Range Status   MRSA by PCR NEGATIVE NEGATIVE Final    Comment:        The GeneXpert MRSA Assay (FDA approved for NASAL specimens only), is one component of a comprehensive MRSA colonization surveillance program. It is not intended to diagnose MRSA infection nor to guide or monitor treatment for MRSA infections.      Radiology Studies: Ct Head Wo Contrast  Result Date: 11/15/2016 CLINICAL DATA:  Altered mental status. Increased confusion and weakness. EXAM: CT HEAD WITHOUT CONTRAST TECHNIQUE: Contiguous axial images were obtained from the base of the skull through  the vertex without intravenous contrast. COMPARISON:  None. FINDINGS: Brain: No evidence of acute infarction, hemorrhage, hydrocephalus, extra-axial collection or mass lesion/mass effect. Vascular: No hyperdense vessel. Skull: No skull fracture or focal lesion. Sinuses/Orbits: Paranasal sinuses and mastoid air cells are clear. The visualized orbits are unremarkable. Other: None. IMPRESSION: No acute intracranial abnormality. Electronically Signed   By: Rubye Oaks M.D.   On: 11/15/2016 20:46   Scheduled Meds: . chlorhexidine  15 mL Mouth Rinse BID  . [START ON 11/18/2016] epoetin (EPOGEN/PROCRIT) injection  2,000 Units Subcutaneous Q T,Th,Sa-HD  . heparin  5,000 Units Subcutaneous Q8H  . mouth rinse  15 mL Mouth Rinse q12n4p  . metoprolol tartrate  5 mg Intravenous Q8H  . sevelamer carbonate  1,600 mg Oral TID WC   Continuous Infusions: . sodium chloride    . sodium chloride    . gentamicin Stopped (11/16/16 2034)     LOS: 2 days   Onnie Boer, MD [facility] Pager [phone]  If 7PM-7AM, please contact night-coverage [email or web address] Password [email or web address] 11/17/2016, 7:45 PM

## 2016-11-17 NOTE — Care Management Important Message (Signed)
Important Message  Patient Details  Name: James Kim MRN: [id number] Date of Birth: [birth year]   Medicare Important Message Given:  Yes    Malcolm Metro, RN 11/17/2016, 1:33 PM

## 2016-11-17 NOTE — Progress Notes (Signed)
James Kim  MRN: [id number]  DOB/AGE: [birth year] 39 y.o.  Primary Care Physician:[name], [name], MD  Admit date: 11/15/2016  Chief Complaint:  Chief Complaint  Patient presents with  . Altered Mental Status    S-Pt presented on  11/15/2016 with  Chief Complaint  Patient presents with  . Altered Mental Status  .    Pt today feels better. Pt says " I am ready to go back to [facility]'   Meds . chlorhexidine  15 mL Mouth Rinse BID  . [START ON 11/18/2016] epoetin (EPOGEN/PROCRIT) injection  2,000 Units Subcutaneous Q T,Th,Sa-HD  . heparin  5,000 Units Subcutaneous Q8H  . mouth rinse  15 mL Mouth Rinse q12n4p  . metoprolol tartrate  5 mg Intravenous Q8H  . sevelamer carbonate  1,600 mg Oral TID WC       Physical Exam: Vital signs in last 24 hours: Temp:  [97.5 F (36.4 C)-99.2 F (37.3 C)] 98.3 F (36.8 C) (07/13 0800) Pulse Rate:  [55-145] 70 (07/13 0800) Resp:  [6-20] 10 (07/13 0800) BP: (117-159)/(71-111) 159/88 (07/13 0800) SpO2:  [97 %-100 %] 100 % (07/13 0800) FiO2 (%):  [30 %] 30 % (07/12 2050) Weight:  [187 lb 6.3 oz (85 kg)-194 lb 3.6 oz (88.1 kg)] 187 lb 6.3 oz (85 kg) (07/13 0458) Weight change: -2 lb 12.4 oz (-1.259 kg) Last BM Date: 11/15/16  Intake/Output from previous day: 07/12 0701 - 07/13 0700 In: 110 [P.O.:60; IV Piggyback:50] Out: 2350 [Urine:350] No intake/output data recorded.   Physical Exam: General- pt is awake,alert, follows commands Resp- No resp distress, NO  Rhonchi CVS- S1S2 regular in rate and rhythm GIT- BS+, soft, NT, ND EXT- NO LE Edema, No Cyanosis Access- PC    Lab Results: CBC  Recent Labs  11/15/16 1501 11/16/16 0431  WBC 6.4 8.7  HGB 9.9* 10.3*  HCT 30.3* 33.1*  PLT 148* 143*    BMET  Recent Labs  11/15/16 1501 11/16/16 0431  NA 141 142  K 4.6 5.1  CL 99* 98*  CO2 30 31  GLUCOSE 82 106*  BUN 39* 43*  CREATININE 9.12* 10.73*  CALCIUM 9.6 9.5    MICRO Recent Results (from the past 240  hour(s))  Blood culture (routine x 2)     Status: None (Preliminary result)   Collection Time: 11/15/16  4:50 PM  Result Value Ref Range Status   Specimen Description BLOOD RIGHT HAND  Final   Special Requests   Final    BOTTLES DRAWN AEROBIC AND ANAEROBIC Blood Culture adequate volume   Culture NO GROWTH 2 DAYS  Final   Report Status PENDING  Incomplete  Blood culture (routine x 2)     Status: None (Preliminary result)   Collection Time: 11/15/16  5:00 PM  Result Value Ref Range Status   Specimen Description BLOOD LEFT HAND  Final   Special Requests   Final    BOTTLES DRAWN AEROBIC AND ANAEROBIC Blood Culture adequate volume   Culture NO GROWTH 2 DAYS  Final   Report Status PENDING  Incomplete  MRSA PCR Screening     Status: None   Collection Time: 11/15/16  6:10 PM  Result Value Ref Range Status   MRSA by PCR NEGATIVE NEGATIVE Final    Comment:        The GeneXpert MRSA Assay (FDA approved for NASAL specimens only), is one component of a comprehensive MRSA colonization surveillance program. It is not intended to diagnose MRSA infection nor to guide  or monitor treatment for MRSA infections.       Lab Results  Component Value Date   CALCIUM 9.5 11/16/2016   PHOS 12.2 (H) 11/16/2016               Impression: 1)Renal  ESRD on HD                Pt is on MWF schedule as outpt                Pt last HD was yesterday as inpt                Will dialyze in am   2)HTN Medication- On Diuretics- On Beta blockers    3)Anemia in ESRD the goal for  HGb is 9--11. Will keep on epo during HD  4)CKD Mineral-Bone Disorder Phosphorus not  at goal.   on binders  Calcium is at goal  5)Resp- admitted woth acute resp failure Pt was started on Bipap Now off, clinically better Primary  MD following  6)Electrolytes Normokalemic NOrmonatremic   7)Acid base Co2 at goal  8) ID -Pt on on IV ABX sec to Gram Neg Bacteremia On Gentamycin w  HD     Plan:  Will dialyze in am  Will keep on genta    Ruthel Martine S 11/17/2016, 8:41 AM

## 2016-11-17 NOTE — Progress Notes (Signed)
Per monitor, respirations 8. Respirations manually counted to be 8, also. Patient sleeping. Sleep apnea noted. SpO2 remains 96-100% on O2 2L [place]. Patient refused Bipap for RT earlier. No apnea alarm has gone off. Will continue to monitor.

## 2016-11-18 DIAGNOSIS — F11929 Opioid use, unspecified with intoxication, unspecified: Secondary | ICD-10-CM

## 2016-11-18 DIAGNOSIS — N186 End stage renal disease: Secondary | ICD-10-CM

## 2016-11-18 DIAGNOSIS — R400 Somnolence: Secondary | ICD-10-CM

## 2016-11-18 LAB — CBC WITH DIFFERENTIAL/PLATELET
Basophils Absolute: 0 10*3/uL (ref 0.0–0.1)
Basophils Relative: 0 %
Eosinophils Absolute: 0 10*3/uL (ref 0.0–0.7)
Eosinophils Relative: 0 %
HCT: 29.8 % — ABNORMAL LOW (ref 39.0–52.0)
Hemoglobin: 10 g/dL — ABNORMAL LOW (ref 13.0–17.0)
Lymphocytes Relative: 27 %
Lymphs Abs: 1.2 10*3/uL (ref 0.7–4.0)
MCH: 30.1 pg (ref 26.0–34.0)
MCHC: 33.6 g/dL (ref 30.0–36.0)
MCV: 89.8 fL (ref 78.0–100.0)
Monocytes Absolute: 0.6 10*3/uL (ref 0.1–1.0)
Monocytes Relative: 12 %
Neutro Abs: 2.8 10*3/uL (ref 1.7–7.7)
Neutrophils Relative %: 61 %
Platelets: 156 10*3/uL (ref 150–400)
RBC: 3.32 MIL/uL — ABNORMAL LOW (ref 4.22–5.81)
RDW: 15.9 % — ABNORMAL HIGH (ref 11.5–15.5)
WBC: 4.6 10*3/uL (ref 4.0–10.5)

## 2016-11-18 LAB — RENAL FUNCTION PANEL
Albumin: 3.6 g/dL (ref 3.5–5.0)
Anion gap: 16 — ABNORMAL HIGH (ref 5–15)
BUN: 46 mg/dL — ABNORMAL HIGH (ref 6–20)
CO2: 27 mmol/L (ref 22–32)
Calcium: 10 mg/dL (ref 8.9–10.3)
Chloride: 97 mmol/L — ABNORMAL LOW (ref 101–111)
Creatinine, Ser: 9.82 mg/dL — ABNORMAL HIGH (ref 0.61–1.24)
GFR calc Af Amer: 7 mL/min — ABNORMAL LOW (ref >60)
GFR calc non Af Amer: 6 mL/min — ABNORMAL LOW (ref >60)
Glucose, Bld: 83 mg/dL (ref 65–99)
Phosphorus: 8.1 mg/dL — ABNORMAL HIGH (ref 2.5–4.6)
Potassium: 3.8 mmol/L (ref 3.5–5.1)
Sodium: 140 mmol/L (ref 135–145)

## 2016-11-18 MED ORDER — GENTAMICIN IN SALINE 1.6-0.9 MG/ML-% IV SOLN
80.0000 mg | INTRAVENOUS | Status: DC
  Administered 2016-11-18: 80 mg via INTRAVENOUS
  Filled 2016-11-18: qty 50

## 2016-11-18 MED ORDER — ALLOPURINOL 100 MG PO TABS: 100.0000 mg | ORAL_TABLET | ORAL | Status: AC

## 2016-11-18 MED ORDER — GENTAMICIN IN SALINE 1.6-0.9 MG/ML-% IV SOLN: 80.0000 mg | INTRAVENOUS | Status: DC

## 2016-11-18 MED ORDER — EPOETIN ALFA 2000 UNIT/ML IJ SOLN
INTRAMUSCULAR | Status: AC
  Administered 2016-11-18: 2000 [IU] via SUBCUTANEOUS
  Filled 2016-11-18: qty 1

## 2016-11-18 MED ORDER — HEPARIN SODIUM (PORCINE) 1000 UNIT/ML IJ SOLN
INTRAMUSCULAR | Status: AC
  Administered 2016-11-18: 1000 [IU] via INTRAVENOUS_CENTRAL
  Filled 2016-11-18: qty 1

## 2016-11-18 MED ORDER — PREGABALIN 25 MG PO CAPS: 25.0000 mg | ORAL_CAPSULE | Freq: Every day | ORAL | Status: DC

## 2016-11-18 NOTE — Progress Notes (Signed)
BLAYTON HUTTNER  MRN: [id number]  DOB/AGE: [birth year] 39 y.o.  Primary Care Physician:[name], [name], MD  Admit date: 11/15/2016  Chief Complaint:  Chief Complaint  Patient presents with  . Altered Mental Status    S-Pt presented on  11/15/2016 with  Chief Complaint  Patient presents with  . Altered Mental Status  .    Pt today feels better.       Pt says " I am ready to go home"'   Meds . allopurinol  100 mg Oral Q48H  . amLODipine  10 mg Oral QODAY  . chlorhexidine  15 mL Mouth Rinse BID  . epoetin (EPOGEN/PROCRIT) injection  2,000 Units Subcutaneous Q T,Th,Sa-HD  . escitalopram  20 mg Oral QHS  . heparin  5,000 Units Subcutaneous Q8H  . mouth rinse  15 mL Mouth Rinse q12n4p  . metoprolol tartrate  100 mg Oral Daily  . pantoprazole  40 mg Oral QAC breakfast  . sevelamer carbonate  1,600 mg Oral TID WC       Physical Exam: Vital signs in last 24 hours: Temp:  [98.3 F (36.8 C)-98.5 F (36.9 C)] 98.5 F (36.9 C) (07/13 2159) Pulse Rate:  [77-112] 77 (07/13 2159) Resp:  [16] 16 (07/13 2159) BP: (140-158)/(83-109) 140/83 (07/13 2159) SpO2:  [97 %-98 %] 97 % (07/14 0032) Weight change:  Last BM Date: 11/15/16  Intake/Output from previous day: 07/13 0701 - 07/14 0700 In: 150 [P.O.:150] Out: 1 [Stool:1] No intake/output data recorded.   Physical Exam: General- pt is awake,alert, follows commands Resp- No resp distress, NO  Rhonchi CVS- S1S2 regular in rate and rhythm GIT- BS+, soft, NT, ND EXT- NO LE Edema, No Cyanosis Access- PC    Lab Results: CBC  Recent Labs  11/15/16 1501 11/16/16 0431  WBC 6.4 8.7  HGB 9.9* 10.3*  HCT 30.3* 33.1*  PLT 148* 143*    BMET  Recent Labs  11/15/16 1501 11/16/16 0431  NA 141 142  K 4.6 5.1  CL 99* 98*  CO2 30 31  GLUCOSE 82 106*  BUN 39* 43*  CREATININE 9.12* 10.73*  CALCIUM 9.6 9.5    MICRO Recent Results (from the past 240 hour(s))  Blood culture (routine x 2)     Status: None  (Preliminary result)   Collection Time: 11/15/16  4:50 PM  Result Value Ref Range Status   Specimen Description BLOOD RIGHT HAND  Final   Special Requests   Final    BOTTLES DRAWN AEROBIC AND ANAEROBIC Blood Culture adequate volume   Culture NO GROWTH 3 DAYS  Final   Report Status PENDING  Incomplete  Blood culture (routine x 2)     Status: None (Preliminary result)   Collection Time: 11/15/16  5:00 PM  Result Value Ref Range Status   Specimen Description BLOOD LEFT HAND  Final   Special Requests   Final    BOTTLES DRAWN AEROBIC AND ANAEROBIC Blood Culture adequate volume   Culture NO GROWTH 3 DAYS  Final   Report Status PENDING  Incomplete  MRSA PCR Screening     Status: None   Collection Time: 11/15/16  6:10 PM  Result Value Ref Range Status   MRSA by PCR NEGATIVE NEGATIVE Final    Comment:        The GeneXpert MRSA Assay (FDA approved for NASAL specimens only), is one component of a comprehensive MRSA colonization surveillance program. It is not intended to diagnose MRSA infection nor to guide or monitor treatment  for MRSA infections.       Lab Results  Component Value Date   CALCIUM 9.5 11/16/2016   PHOS 12.2 (H) 11/16/2016               Impression: 1)Renal  ESRD on HD                Pt is on MWF schedule as outpt                Pt will be  Dialyzed today.  2)HTN Medication- On Diuretics- On Beta blockers    3)Anemia in ESRD the goal for  HGb is 9--11. Will keep on epo during HD  4)CKD Mineral-Bone Disorder Phosphorus not  at goal.   on binders  Calcium is at goal  5)Resp- admitted woth acute resp failure Pt was started on Bipap Now off, clinically better Primary  MD following  6)Electrolytes Normokalemic NOrmonatremic   7)Acid base Co2 at goal  8) ID -Pt on on IV ABX sec to Gram Neg Bacteremia On Gentamycin w HD     Plan:  Will dialyze today Will keep on genta   Addendum Pt seen on  HD   BHUTANI,MANPREET S 11/18/2016, 8:18 AM

## 2016-11-18 NOTE — Discharge Summary (Signed)
Physician Discharge Summary  PRINCEMICHAEL GRAVIER QQP:[id number] DOB: Feb 14, 1976 DOA: 11/15/2016  PCP: Salomon Mast, MD  Admit date: 11/15/2016 Discharge date: 11/18/2016  Admitted From: Home  Disposition:  Home   Recommendations for Outpatient Follow-up:  1. Follow up with PCP in 1-2 weeks 2.  Home Health: No Equipment/Devices:  No   Discharge Condition Stable  CODE STATUS: Full  Diet recommendation: Renal diet.  HPI: On admission by Dr. Robb Matar. James Kim is a 41 y.o. male with medical history significant of anxiety, arthritis, history of childhood asthma, end-stage renal disease on hemodialysis, history of peritoneal dialysis associated peritonitis, GERD, hypertension, OSA on CPAP, medication and HD noncompliance who is brought to the emergency department by his wife due to progressively worse confusion, weakness and somnolence. Patient's wife denies fever, chills, rigors, but states that the patient has been feeling weak. He has been treated with vancomycin after each dialysis for HD catheter related bacteremia since last month. She denies the patient complaining of chest or abdominal pain, nausea, emesis, diarrhea, melena or hematochezia. No history of dysuria, frequency or hematuria. The patient is currently somnolent and unable to provide further history.  ED Course: Initial vital signs emergency department temperature 98.41F, pulse 73, blood pressure 117/90 mmHg, respiratory rate 18 and O2 sat 88% on room air. Dr. Juleen China describes the patient snoring, having shallow breaths with a slow and irregular respiratory rate. The patient had an ABG drawn in the emergency department which showed a pH of 7.295, PCO2 is 62.9 mm from her, PaO2 of 68.6 mmHg, O2 sat 92.4% and bicarbonate of 26.8 mmol/L. He was subsequently started on BiPAP ventilation in the ED showing some clinical improvement since stent per patient's spouse.   Other labs, WBC 6.4, hemoglobin 9.9 g/dL and platelets 712.  Sodium is 141, potassium 4.6, chloride 99, bicarbonate 30 and lactic acid is 0.4 mmol/L. His BUN was 39, creatinine 9.12 and glucose 82 mg/dL. His chest radiograph showed low lung volumes with mild pulmonary edema and cardiomegaly. Blood cultures 2 were drawn.  Review of Systems: As per HPI otherwise 10 point review of systems negative.   Discharge Diagnoses:   Opioid intoxication- patient presented with altered mental status- somnolence with respiratory acidosis- with admitting pH of 7.29, Pco2- 62 and improved with BiPAP. Patient decompensated again- PH 7.1. Pco2- 73, Improved again with BiPAP, and reversal of opioid intoxication with trial of naloxone. Head CT- negative for intracranial abnormality, chest x-ray pulmonary edema and mild cardiomegaly. Blood cultures 2- no growth after 4 days. UDS- positive for benzos and opiates With trial of naloxone patient's suddenly became awake and alert responding to questions. Mental status significantly improved after first HD. Had serous and in-depth conversation about patient's opioid use, recommendation that he should not be on this medication considering he is end-stage renal disease status and should consider seeking definitive sub-specialist evaluation to address cause of pain. Patient voiced understanding. Patient had 2 dialysis sessions inpatient. Patient's mental status was back to baseline at the time of discharge . Patient was discharged to home.  End-stage kidney disease (HCC)- continue HD schedule.   Gout, arthropathy Allopurinol dose adjusted for ESRD status on discharge  Discharge Instructions  Discharge Instructions    Diet - low sodium heart healthy    Complete by:  As directed    Discharge instructions    Complete by:  As directed    It is my recommendation based on recent while admitted in this hospital that you do not continue  to take pain medications-OxyContin and Percocet, and that he also stopped taking Xanax. Considering the  fact that you are on dialysis, these drugs are likely to hang around in your systemic longer and easily cause overdose. Overdose of this medication is the reason why you were admitted.   Please follow up with your primary care provider in one week, and consider seeking subspecialist evaluation for definitive treatment of your pain.  Stop taking OxyContin Percocets and Xanax.  Your allopurinol and Lyrica doses were adjusted for your renal/kidney status   Increase activity slowly    Complete by:  As directed      Allergies as of 11/18/2016      Reactions   Sulfa Antibiotics Anaphylaxis, Itching, Rash      Medication List    STOP taking these medications   ALPRAZolam 0.5 MG tablet Commonly known as:  XANAX   omeprazole 20 MG capsule Commonly known as:  PRILOSEC   oxyCODONE 30 MG 12 hr tablet Commonly known as:  OXYCONTIN   oxyCODONE-acetaminophen 10-325 MG tablet Commonly known as:  PERCOCET   vancomycin IVPB     TAKE these medications   allopurinol 100 MG tablet Commonly known as:  ZYLOPRIM Take 1 tablet (100 mg total) by mouth every other day. What changed:  when to take this   amLODipine 10 MG tablet Commonly known as:  NORVASC Take 1 tablet (10 mg total) by mouth every other day.   escitalopram 20 MG tablet Commonly known as:  LEXAPRO Take 20 mg by mouth at bedtime.   furosemide 20 MG tablet Commonly known as:  LASIX Take 2 tablets (40 mg total) by mouth 2 (two) times daily.   gentamicin 1.6-0.9 MG/ML-% Commonly known as:  GARAMYCIN Inject 50 mLs (80 mg total) into the vein Every Tuesday,Thursday,and Saturday with dialysis.   metoprolol tartrate 100 MG tablet Commonly known as:  LOPRESSOR Take 1 tablet by mouth daily.   pantoprazole 40 MG tablet Commonly known as:  PROTONIX Take 40 mg by mouth daily before breakfast.   pregabalin 25 MG capsule Commonly known as:  LYRICA Take 1 capsule (25 mg total) by mouth daily. What changed:  when to take  this  reasons to take this   promethazine 12.5 MG tablet Commonly known as:  PHENERGAN Take 12.5 mg by mouth daily as needed for nausea or vomiting.   sevelamer carbonate 800 MG tablet Commonly known as:  RENVELA Take 2 tablets (1,600 mg total) by mouth 3 (three) times daily with meals.   Vitamin D (Ergocalciferol) 50000 units Caps capsule Commonly known as:  DRISDOL Take 50,000 Units by mouth every Wednesday.       Allergies  Allergen Reactions  . Sulfa Antibiotics Anaphylaxis, Itching and Rash    Consultations:  Nephrology    Procedures/Studies: Dg Chest 2 View  Result Date: 11/03/2016 CLINICAL DATA:  41 year old male status post dialysis 4 days ago, bleeding from left chest PermCath site today. Denies pain. EXAM: CHEST  2 VIEW COMPARISON:  10/05/2016 and earlier. FINDINGS: Previously-seen right chest dialysis catheter has been removed, and a left chest tunneled type dual lumen dialysis catheter is now in place. No adverse features identified. Lung volumes remain normal. The lungs are clear. No pneumothorax or pleural effusion. Normal cardiac size and mediastinal contours. Visualized tracheal air column is within normal limits. No acute osseous abnormality identified. Negative visible bowel gas pattern. IMPRESSION: Tunneled left chest dialysis catheter with no adverse features. No acute cardiopulmonary abnormality. Electronically Signed  By: Odessa Fleming M.D.   On: 11/03/2016 20:00   Ct Head Wo Contrast  Result Date: 11/15/2016 CLINICAL DATA:  Altered mental status. Increased confusion and weakness. EXAM: CT HEAD WITHOUT CONTRAST TECHNIQUE: Contiguous axial images were obtained from the base of the skull through the vertex without intravenous contrast. COMPARISON:  None. FINDINGS: Brain: No evidence of acute infarction, hemorrhage, hydrocephalus, extra-axial collection or mass lesion/mass effect. Vascular: No hyperdense vessel. Skull: No skull fracture or focal lesion.  Sinuses/Orbits: Paranasal sinuses and mastoid air cells are clear. The visualized orbits are unremarkable. Other: None. IMPRESSION: No acute intracranial abnormality. Electronically Signed   By: Rubye Oaks M.D.   On: 11/15/2016 20:46   Dg Chest Portable 1 View  Result Date: 11/15/2016 CLINICAL DATA:  Hypoxemia. Altered mental status. End-stage renal disease, missed dialysis today. EXAM: PORTABLE CHEST 1 VIEW COMPARISON:  Radiograph 11/03/2016 FINDINGS: Left dialysis catheter with tip at the atrial caval junction. Lower lung volumes from prior exam leading to bronchovascular crowding. There is probable pulmonary edema with peribronchial cuffing. Mild cardiomegaly with unchanged mediastinal contours. No large pleural effusion. No pneumothorax. IMPRESSION: Low lung volumes with pulmonary edema and mild cardiomegaly. Electronically Signed   By: Rubye Oaks M.D.   On: 11/15/2016 16:44     Subjective:  No complaints today. Receiving second HD. Otherwise Ready for discharge. On room air no shortness of breath alert and oriented. Answering questions appropriately. Back to baseline  Discharge Exam: Vitals:   11/18/16 1130 11/18/16 1145  BP: 118/68 133/75  Pulse: 71 81  Resp:  18  Temp:  99 F (37.2 C)   Vitals:   11/18/16 1030 11/18/16 1100 11/18/16 1130 11/18/16 1145  BP: 136/71 121/63 118/68 133/75  Pulse: 64 85 71 81  Resp:    18  Temp:    99 F (37.2 C)  TempSrc:    Oral  SpO2:      Weight:      Height:        General: Pt is alert, awake, not in acute distress Cardiovascular: RRR, S1/S2 +, no rubs, no gallops Respiratory: CTA bilaterally, no wheezing, no rhonchi Abdominal: Soft, NT, ND, bowel sounds + Extremities: no edema, no cyanosis  The results of significant diagnostics from this hospitalization (including imaging, microbiology, ancillary and laboratory) are listed below for reference.     Microbiology: Recent Results (from the past 240 hour(s))  Blood culture  (routine x 2)     Status: None (Preliminary result)   Collection Time: 11/15/16  4:50 PM  Result Value Ref Range Status   Specimen Description BLOOD RIGHT HAND  Final   Special Requests   Final    BOTTLES DRAWN AEROBIC AND ANAEROBIC Blood Culture adequate volume   Culture NO GROWTH 3 DAYS  Final   Report Status PENDING  Incomplete  Blood culture (routine x 2)     Status: None (Preliminary result)   Collection Time: 11/15/16  5:00 PM  Result Value Ref Range Status   Specimen Description BLOOD LEFT HAND  Final   Special Requests   Final    BOTTLES DRAWN AEROBIC AND ANAEROBIC Blood Culture adequate volume   Culture NO GROWTH 3 DAYS  Final   Report Status PENDING  Incomplete  MRSA PCR Screening     Status: None   Collection Time: 11/15/16  6:10 PM  Result Value Ref Range Status   MRSA by PCR NEGATIVE NEGATIVE Final    Comment:  The GeneXpert MRSA Assay (FDA approved for NASAL specimens only), is one component of a comprehensive MRSA colonization surveillance program. It is not intended to diagnose MRSA infection nor to guide or monitor treatment for MRSA infections.      Labs: Basic Metabolic Panel:  Recent Labs Lab 11/15/16 1501 11/16/16 0431 11/18/16 0746  NA 141 142 140  K 4.6 5.1 3.8  CL 99* 98* 97*  CO2 30 31 27   GLUCOSE 82 106* 83  BUN 39* 43* 46*  CREATININE 9.12* 10.73* 9.82*  CALCIUM 9.6 9.5 10.0  PHOS  --  12.2* 8.1*   Liver Function Tests:  Recent Labs Lab 11/15/16 1501 11/16/16 0431 11/18/16 0746  AST 19  --   --   ALT 16*  --   --   ALKPHOS 75  --   --   BILITOT 0.6  --   --   PROT 7.6  --   --   ALBUMIN 3.5 3.6 3.6   CBC:  Recent Labs Lab 11/15/16 1501 11/16/16 0431 11/18/16 0746  WBC 6.4 8.7 4.6  NEUTROABS 3.9  --  2.8  HGB 9.9* 10.3* 10.0*  HCT 30.3* 33.1* 29.8*  MCV 92.4 95.1 89.8  PLT 148* 143* 156   Urinalysis    Component Value Date/Time   COLORURINE YELLOW 11/15/2016 1452   APPEARANCEUR CLEAR 11/15/2016 1452    LABSPEC 1.011 11/15/2016 1452   PHURINE 7.0 11/15/2016 1452   GLUCOSEU 50 (A) 11/15/2016 1452   HGBUR SMALL (A) 11/15/2016 1452   BILIRUBINUR NEGATIVE 11/15/2016 1452   KETONESUR NEGATIVE 11/15/2016 1452   PROTEINUR >=300 (A) 11/15/2016 1452   NITRITE NEGATIVE 11/15/2016 1452   LEUKOCYTESUR NEGATIVE 11/15/2016 1452   Sepsis Labs Invalid input(s): PROCALCITONIN,  WBC,  LACTICIDVEN Microbiology Recent Results (from the past 240 hour(s))  Blood culture (routine x 2)     Status: None (Preliminary result)   Collection Time: 11/15/16  4:50 PM  Result Value Ref Range Status   Specimen Description BLOOD RIGHT HAND  Final   Special Requests   Final    BOTTLES DRAWN AEROBIC AND ANAEROBIC Blood Culture adequate volume   Culture NO GROWTH 3 DAYS  Final   Report Status PENDING  Incomplete  Blood culture (routine x 2)     Status: None (Preliminary result)   Collection Time: 11/15/16  5:00 PM  Result Value Ref Range Status   Specimen Description BLOOD LEFT HAND  Final   Special Requests   Final    BOTTLES DRAWN AEROBIC AND ANAEROBIC Blood Culture adequate volume   Culture NO GROWTH 3 DAYS  Final   Report Status PENDING  Incomplete  MRSA PCR Screening     Status: None   Collection Time: 11/15/16  6:10 PM  Result Value Ref Range Status   MRSA by PCR NEGATIVE NEGATIVE Final    Comment:        The GeneXpert MRSA Assay (FDA approved for NASAL specimens only), is one component of a comprehensive MRSA colonization surveillance program. It is not intended to diagnose MRSA infection nor to guide or monitor treatment for MRSA infections.      Time coordinating discharge: Over 30 minutes  SIGNED:  01/16/17, MD  [facility] 11/18/2016, 1:32 PM Pager [phone]  If 7PM-7AM, please contact night-coverage [email or web address] Password [email or web address]

## 2016-11-19 DIAGNOSIS — F11929 Opioid use, unspecified with intoxication, unspecified: Secondary | ICD-10-CM | POA: Diagnosis present

## 2016-11-20 DIAGNOSIS — N186 End stage renal disease: Secondary | ICD-10-CM | POA: Diagnosis not present

## 2016-11-20 DIAGNOSIS — N2581 Secondary hyperparathyroidism of renal origin: Secondary | ICD-10-CM | POA: Diagnosis not present

## 2016-11-20 DIAGNOSIS — D689 Coagulation defect, unspecified: Secondary | ICD-10-CM | POA: Diagnosis not present

## 2016-11-20 DIAGNOSIS — E8809 Other disorders of plasma-protein metabolism, not elsewhere classified: Secondary | ICD-10-CM | POA: Diagnosis not present

## 2016-11-20 DIAGNOSIS — R7881 Bacteremia: Secondary | ICD-10-CM | POA: Diagnosis not present

## 2016-11-20 LAB — CULTURE, BLOOD (ROUTINE X 2)
Culture: NO GROWTH
Culture: NO GROWTH
Special Requests: ADEQUATE
Special Requests: ADEQUATE

## 2016-11-22 DIAGNOSIS — N186 End stage renal disease: Secondary | ICD-10-CM | POA: Diagnosis not present

## 2016-11-22 DIAGNOSIS — D689 Coagulation defect, unspecified: Secondary | ICD-10-CM | POA: Diagnosis not present

## 2016-11-22 DIAGNOSIS — E8809 Other disorders of plasma-protein metabolism, not elsewhere classified: Secondary | ICD-10-CM | POA: Diagnosis not present

## 2016-11-22 DIAGNOSIS — N2581 Secondary hyperparathyroidism of renal origin: Secondary | ICD-10-CM | POA: Diagnosis not present

## 2016-11-22 DIAGNOSIS — R7881 Bacteremia: Secondary | ICD-10-CM | POA: Diagnosis not present

## 2016-11-27 DIAGNOSIS — E8809 Other disorders of plasma-protein metabolism, not elsewhere classified: Secondary | ICD-10-CM | POA: Diagnosis not present

## 2016-11-27 DIAGNOSIS — D689 Coagulation defect, unspecified: Secondary | ICD-10-CM | POA: Diagnosis not present

## 2016-11-27 DIAGNOSIS — N2581 Secondary hyperparathyroidism of renal origin: Secondary | ICD-10-CM | POA: Diagnosis not present

## 2016-11-27 DIAGNOSIS — R7881 Bacteremia: Secondary | ICD-10-CM | POA: Diagnosis not present

## 2016-11-27 DIAGNOSIS — N186 End stage renal disease: Secondary | ICD-10-CM | POA: Diagnosis not present

## 2016-11-29 ENCOUNTER — Other Ambulatory Visit: Payer: Self-pay

## 2016-12-06 DIAGNOSIS — D631 Anemia in chronic kidney disease: Secondary | ICD-10-CM | POA: Diagnosis not present

## 2016-12-06 DIAGNOSIS — E8770 Fluid overload, unspecified: Secondary | ICD-10-CM | POA: Diagnosis not present

## 2016-12-06 DIAGNOSIS — N2581 Secondary hyperparathyroidism of renal origin: Secondary | ICD-10-CM | POA: Diagnosis not present

## 2016-12-06 DIAGNOSIS — R5081 Fever presenting with conditions classified elsewhere: Secondary | ICD-10-CM | POA: Diagnosis not present

## 2016-12-06 DIAGNOSIS — D500 Iron deficiency anemia secondary to blood loss (chronic): Secondary | ICD-10-CM | POA: Diagnosis not present

## 2016-12-06 DIAGNOSIS — N186 End stage renal disease: Secondary | ICD-10-CM | POA: Diagnosis not present

## 2016-12-06 DIAGNOSIS — E8809 Other disorders of plasma-protein metabolism, not elsewhere classified: Secondary | ICD-10-CM | POA: Diagnosis not present

## 2016-12-08 ENCOUNTER — Encounter (HOSPITAL_COMMUNITY): Payer: Self-pay | Admitting: *Deleted

## 2016-12-08 DIAGNOSIS — N2581 Secondary hyperparathyroidism of renal origin: Secondary | ICD-10-CM | POA: Diagnosis not present

## 2016-12-08 DIAGNOSIS — R5081 Fever presenting with conditions classified elsewhere: Secondary | ICD-10-CM | POA: Diagnosis not present

## 2016-12-08 DIAGNOSIS — D500 Iron deficiency anemia secondary to blood loss (chronic): Secondary | ICD-10-CM | POA: Diagnosis not present

## 2016-12-08 DIAGNOSIS — N186 End stage renal disease: Secondary | ICD-10-CM | POA: Diagnosis not present

## 2016-12-08 DIAGNOSIS — D631 Anemia in chronic kidney disease: Secondary | ICD-10-CM | POA: Diagnosis not present

## 2016-12-08 MED ORDER — ALTEPLASE 2 MG IJ SOLR
2.0000 mg | Freq: Once | INTRAMUSCULAR | Status: DC | PRN
Start: 2016-12-08 — End: 2016-12-08

## 2016-12-08 MED ORDER — PENTAFLUOROPROP-TETRAFLUOROETH EX AERO: 1.0000 "application " | INHALATION_SPRAY | CUTANEOUS | Status: DC | PRN

## 2016-12-08 MED ORDER — SODIUM CHLORIDE 0.9 % IV SOLN: 100.0000 mL | INTRAVENOUS | Status: DC | PRN

## 2016-12-08 MED ORDER — HEPARIN SODIUM (PORCINE) 1000 UNIT/ML DIALYSIS: 1000.0000 [IU] | INTRAMUSCULAR | Status: DC | PRN

## 2016-12-08 MED ORDER — LIDOCAINE HCL (PF) 1 % IJ SOLN: 5.0000 mL | INTRAMUSCULAR | Status: DC | PRN

## 2016-12-08 MED ORDER — HEPARIN SODIUM (PORCINE) 1000 UNIT/ML DIALYSIS: 40.0000 [IU]/kg | INTRAMUSCULAR | Status: DC | PRN

## 2016-12-08 MED ORDER — LIDOCAINE-PRILOCAINE 2.5-2.5 % EX CREA: 1.0000 "application " | TOPICAL_CREAM | CUTANEOUS | Status: DC | PRN

## 2016-12-08 NOTE — Progress Notes (Signed)
Mr Sanfilippo was seen at [address] [facility] on 11/15/16 with mental status change. Mr Costilla reports that the ED first thought that patient had taken too much pain medication.  Mrs Stefanko said that patient had been on the medication for a long time. "He also had too much carbon monoxide in his blood, Mrs Willert reported. Mrs Antony said that there was a problem with CPAP , but now it fixed and patient has not had any further problems.

## 2016-12-09 DIAGNOSIS — D500 Iron deficiency anemia secondary to blood loss (chronic): Secondary | ICD-10-CM | POA: Diagnosis not present

## 2016-12-09 DIAGNOSIS — D631 Anemia in chronic kidney disease: Secondary | ICD-10-CM | POA: Diagnosis not present

## 2016-12-09 DIAGNOSIS — N2581 Secondary hyperparathyroidism of renal origin: Secondary | ICD-10-CM | POA: Diagnosis not present

## 2016-12-09 DIAGNOSIS — N186 End stage renal disease: Secondary | ICD-10-CM | POA: Diagnosis not present

## 2016-12-09 DIAGNOSIS — R5081 Fever presenting with conditions classified elsewhere: Secondary | ICD-10-CM | POA: Diagnosis not present

## 2016-12-11 DIAGNOSIS — N186 End stage renal disease: Secondary | ICD-10-CM | POA: Diagnosis not present

## 2016-12-11 DIAGNOSIS — N2581 Secondary hyperparathyroidism of renal origin: Secondary | ICD-10-CM | POA: Diagnosis not present

## 2016-12-11 DIAGNOSIS — D500 Iron deficiency anemia secondary to blood loss (chronic): Secondary | ICD-10-CM | POA: Diagnosis not present

## 2016-12-11 DIAGNOSIS — D631 Anemia in chronic kidney disease: Secondary | ICD-10-CM | POA: Diagnosis not present

## 2016-12-11 DIAGNOSIS — R5081 Fever presenting with conditions classified elsewhere: Secondary | ICD-10-CM | POA: Diagnosis not present

## 2016-12-12 ENCOUNTER — Ambulatory Visit (HOSPITAL_COMMUNITY): Payer: Medicare Other | Admitting: Anesthesiology

## 2016-12-12 ENCOUNTER — Ambulatory Visit (HOSPITAL_COMMUNITY)
Admission: RE | Admit: 2016-12-12 | Discharge: 2016-12-12 | Disposition: A | Discharge status: Home / Self Care | Payer: Medicare Other | Source: Ambulatory Visit | Attending: Vascular Surgery | Admitting: Vascular Surgery

## 2016-12-12 ENCOUNTER — Encounter (HOSPITAL_COMMUNITY)
Admission: RE | Disposition: A | Discharge status: Home / Self Care | Payer: Self-pay | Source: Ambulatory Visit | Attending: Vascular Surgery

## 2016-12-12 ENCOUNTER — Encounter (HOSPITAL_COMMUNITY): Payer: Self-pay | Admitting: *Deleted

## 2016-12-12 DIAGNOSIS — N185 Chronic kidney disease, stage 5: Secondary | ICD-10-CM | POA: Diagnosis not present

## 2016-12-12 DIAGNOSIS — Z79899 Other long term (current) drug therapy: Secondary | ICD-10-CM | POA: Insufficient documentation

## 2016-12-12 DIAGNOSIS — Z992 Dependence on renal dialysis: Secondary | ICD-10-CM | POA: Diagnosis not present

## 2016-12-12 DIAGNOSIS — G4733 Obstructive sleep apnea (adult) (pediatric): Secondary | ICD-10-CM | POA: Diagnosis not present

## 2016-12-12 DIAGNOSIS — M069 Rheumatoid arthritis, unspecified: Secondary | ICD-10-CM | POA: Insufficient documentation

## 2016-12-12 DIAGNOSIS — Z882 Allergy status to sulfonamides status: Secondary | ICD-10-CM | POA: Insufficient documentation

## 2016-12-12 DIAGNOSIS — K219 Gastro-esophageal reflux disease without esophagitis: Secondary | ICD-10-CM | POA: Insufficient documentation

## 2016-12-12 DIAGNOSIS — M1990 Unspecified osteoarthritis, unspecified site: Secondary | ICD-10-CM | POA: Diagnosis not present

## 2016-12-12 DIAGNOSIS — M109 Gout, unspecified: Secondary | ICD-10-CM | POA: Insufficient documentation

## 2016-12-12 DIAGNOSIS — I120 Hypertensive chronic kidney disease with stage 5 chronic kidney disease or end stage renal disease: Secondary | ICD-10-CM | POA: Insufficient documentation

## 2016-12-12 DIAGNOSIS — N186 End stage renal disease: Secondary | ICD-10-CM | POA: Insufficient documentation

## 2016-12-12 DIAGNOSIS — E785 Hyperlipidemia, unspecified: Secondary | ICD-10-CM | POA: Insufficient documentation

## 2016-12-12 DIAGNOSIS — D631 Anemia in chronic kidney disease: Secondary | ICD-10-CM | POA: Diagnosis not present

## 2016-12-12 HISTORY — DX: Restless legs syndrome: G25.81

## 2016-12-12 HISTORY — PX: AV FISTULA PLACEMENT: SHX1204

## 2016-12-12 HISTORY — DX: Dermatitis, unspecified: L30.9

## 2016-12-12 LAB — POCT I-STAT 4, (NA,K, GLUC, HGB,HCT)
GLUCOSE: 97 mg/dL (ref 65–99)
HEMATOCRIT: 39 % (ref 39.0–52.0)
HEMOGLOBIN: 13.3 g/dL (ref 13.0–17.0)
POTASSIUM: 4.5 mmol/L (ref 3.5–5.1)
Sodium: 139 mmol/L (ref 135–145)

## 2016-12-12 SURGERY — ARTERIOVENOUS (AV) FISTULA CREATION
Anesthesia: General | Site: Arm Upper | Laterality: Right

## 2016-12-12 MED ORDER — PROPOFOL 10 MG/ML IV BOLUS
INTRAVENOUS | Status: DC | PRN
  Administered 2016-12-12: 180 mg via INTRAVENOUS
  Administered 2016-12-12: 20 mg via INTRAVENOUS

## 2016-12-12 MED ORDER — OXYCODONE-ACETAMINOPHEN 5-325 MG PO TABS: 1.0000 | ORAL_TABLET | Freq: Four times a day (QID) | ORAL | 0 refills | Status: DC | PRN

## 2016-12-12 MED ORDER — PROPOFOL 10 MG/ML IV BOLUS
INTRAVENOUS | Status: AC
  Filled 2016-12-12: qty 20

## 2016-12-12 MED ORDER — LIDOCAINE HCL (CARDIAC) 20 MG/ML IV SOLN
INTRAVENOUS | Status: DC | PRN
  Administered 2016-12-12: 100 mg via INTRAVENOUS

## 2016-12-12 MED ORDER — HEPARIN SODIUM (PORCINE) 5000 UNIT/ML IJ SOLN
INTRAMUSCULAR | Status: DC | PRN
  Administered 2016-12-12: 500 mL

## 2016-12-12 MED ORDER — PHENYLEPHRINE HCL 10 MG/ML IJ SOLN
INTRAMUSCULAR | Status: DC | PRN
  Administered 2016-12-12 (×3): 80 ug via INTRAVENOUS
  Administered 2016-12-12 (×2): 40 ug via INTRAVENOUS
  Administered 2016-12-12: 80 ug via INTRAVENOUS

## 2016-12-12 MED ORDER — FENTANYL CITRATE (PF) 250 MCG/5ML IJ SOLN
INTRAMUSCULAR | Status: AC
  Filled 2016-12-12: qty 5

## 2016-12-12 MED ORDER — ONDANSETRON HCL 4 MG/2ML IJ SOLN
INTRAMUSCULAR | Status: DC | PRN
  Administered 2016-12-12: 4 mg via INTRAVENOUS

## 2016-12-12 MED ORDER — LIDOCAINE-EPINEPHRINE (PF) 1 %-1:200000 IJ SOLN
INTRAMUSCULAR | Status: AC
  Filled 2016-12-12: qty 30

## 2016-12-12 MED ORDER — 0.9 % SODIUM CHLORIDE (POUR BTL) OPTIME
TOPICAL | Status: DC | PRN
  Administered 2016-12-12: 1000 mL

## 2016-12-12 MED ORDER — SODIUM CHLORIDE 0.9 % IV SOLN: INTRAVENOUS | Status: DC

## 2016-12-12 MED ORDER — DEXTROSE 5 % IV SOLN
1.5000 g | INTRAVENOUS | Status: AC
  Administered 2016-12-12: 1.5 g via INTRAVENOUS
  Filled 2016-12-12: qty 1.5

## 2016-12-12 MED ORDER — CHLORHEXIDINE GLUCONATE CLOTH 2 % EX PADS: 6.0000 | MEDICATED_PAD | Freq: Once | CUTANEOUS | Status: DC

## 2016-12-12 MED ORDER — MIDAZOLAM HCL 2 MG/2ML IJ SOLN
INTRAMUSCULAR | Status: AC
  Filled 2016-12-12: qty 2

## 2016-12-12 MED ORDER — FENTANYL CITRATE (PF) 100 MCG/2ML IJ SOLN: 25.0000 ug | INTRAMUSCULAR | Status: DC | PRN

## 2016-12-12 MED ORDER — SODIUM CHLORIDE 0.9 % IV SOLN
INTRAVENOUS | Status: DC
  Administered 2016-12-12: 08:00:00 via INTRAVENOUS

## 2016-12-12 SURGICAL SUPPLY — 28 items
ARMBAND PINK RESTRICT EXTREMIT (MISCELLANEOUS) ×2 IMPLANT
CANISTER SUCT 3000ML PPV (MISCELLANEOUS) ×2 IMPLANT
CLIP TI WIDE RED SMALL 6 (CLIP) ×4 IMPLANT
CLIP VESOCCLUDE MED 6/CT (CLIP) ×2 IMPLANT
CLIP VESOCCLUDE SM WIDE 6/CT (CLIP) ×2 IMPLANT
COVER PROBE W GEL 5X96 (DRAPES) ×2 IMPLANT
DERMABOND ADVANCED (GAUZE/BANDAGES/DRESSINGS) ×1
DERMABOND ADVANCED .7 DNX12 (GAUZE/BANDAGES/DRESSINGS) ×1 IMPLANT
ELECT REM PT RETURN 9FT ADLT (ELECTROSURGICAL) ×2
ELECTRODE REM PT RTRN 9FT ADLT (ELECTROSURGICAL) ×1 IMPLANT
GLOVE BIO SURGEON STRL SZ7.5 (GLOVE) ×2 IMPLANT
GLOVE BIOGEL PI IND STRL 6.5 (GLOVE) ×2 IMPLANT
GLOVE BIOGEL PI INDICATOR 6.5 (GLOVE) ×2
GOWN STRL REUS W/ TWL LRG LVL3 (GOWN DISPOSABLE) ×2 IMPLANT
GOWN STRL REUS W/ TWL XL LVL3 (GOWN DISPOSABLE) ×1 IMPLANT
GOWN STRL REUS W/TWL LRG LVL3 (GOWN DISPOSABLE) ×2
GOWN STRL REUS W/TWL XL LVL3 (GOWN DISPOSABLE) ×1
KIT BASIN OR (CUSTOM PROCEDURE TRAY) ×2 IMPLANT
KIT ROOM TURNOVER OR (KITS) ×2 IMPLANT
NS IRRIG 1000ML POUR BTL (IV SOLUTION) ×2 IMPLANT
PACK CV ACCESS (CUSTOM PROCEDURE TRAY) ×2 IMPLANT
PAD ARMBOARD 7.5X6 YLW CONV (MISCELLANEOUS) ×4 IMPLANT
SUT MNCRL AB 4-0 PS2 18 (SUTURE) ×2 IMPLANT
SUT PROLENE 6 0 BV (SUTURE) ×4 IMPLANT
SUT VIC AB 3-0 SH 27 (SUTURE) ×1
SUT VIC AB 3-0 SH 27X BRD (SUTURE) ×1 IMPLANT
UNDERPAD 30X30 (UNDERPADS AND DIAPERS) ×2 IMPLANT
WATER STERILE IRR 1000ML POUR (IV SOLUTION) ×2 IMPLANT

## 2016-12-12 NOTE — Transfer of Care (Signed)
Immediate Anesthesia Transfer of Care Note  Patient: James Kim  Procedure(s) Performed: Procedure(s): ARTERIOVENOUS  FISTULA CREATION-RIGHT BRACHIOCEPHALIC (Right)  Patient Location: PACU  Anesthesia Type:General  Level of Consciousness: drowsy  Airway & Oxygen Therapy: Patient Spontanous Breathing and Patient connected to face mask oxygen  Post-op Assessment: Report given to RN and Post -op Vital signs reviewed and stable  Post vital signs: Reviewed and stable  Last Vitals:  Vitals:   12/12/16 0756  BP: 101/64  Pulse: 69  Resp: 20  Temp: 36.8 C    Last Pain:  Vitals:   12/12/16 0756  TempSrc: Oral      Patients Stated Pain Goal: 4 (12/12/16 6440)  Complications: No apparent anesthesia complications

## 2016-12-12 NOTE — Anesthesia Procedure Notes (Signed)
Procedure Name: LMA Insertion Date/Time: 12/12/2016 9:50 AM Performed by: Daiva Eves Pre-anesthesia Checklist: Patient identified, Emergency Drugs available, Suction available, Patient being monitored and Timeout performed Patient Re-evaluated:Patient Re-evaluated prior to induction Oxygen Delivery Method: Circle system utilized Preoxygenation: Pre-oxygenation with 100% oxygen Induction Type: IV induction Ventilation: Mask ventilation with difficulty LMA: LMA inserted LMA Size: 4.0 Number of attempts: 1 Placement Confirmation: positive ETCO2 and breath sounds checked- equal and bilateral Tube secured with: Tape Dental Injury: Teeth and Oropharynx as per pre-operative assessment

## 2016-12-12 NOTE — Op Note (Signed)
    Patient name: James Kim MRN: [id number] DOB: [birth year] Sex: male  12/12/2016 Pre-operative Diagnosis: esrd Post-operative diagnosis:  Same Surgeon:  Apolinar Junes C. Randie Heinz, MD Assistant: OR nurse Procedure Performed:  Right brachiocephalic av fistula creation  Indications:  A 41 year old male with history N0 disease on dialysis via left IJ catheter at this time. He is now indicated for permanent access has suitable vein in the right upper extremity.  Findings: There is a large brachiocephalic vein at the antecubitum as well as a 4 mm external diameter brachial artery. A completion a palpable radial pulse as well as weak thrill in the runoff vein that is confirmed by Doppler.   Procedure:  The patient was identified in the holding area and taken to the operating room where LMA anesthesia was induced he was sterilely prepped and draped in the right upper extremity in usual fashion given antibiotics and timeout called. We began with ultrasound-guided identification of the right cephalic vein. We then made a transverse incision overlying this as well as the brachial artery. We dissected down sex of the vein distally marked for orientation. We then divided the deep fascia separated our brachial artery and veins placed vessel loop around the artery. The vein was then clamped cephalad on the arm transected distally and tied off. It was dilated up to 3.5 mm flushed with heparinized saline and spatulated its end. The artery was then clamped distally and proximally opened longitudinally and flushed with heparinized saline in both directions. The vein was then sewn end-to-side with 6-0 Prolene suture. Prior to completing anastomosis we allowed flushing from both directions. We then completed the anastomosis. The vein was somewhat tethered we freed this. We then repaired small branch injury on the vein with 6-0 Prolene suture. This time we had a palpable thrill in the proximal S aspect of the fistula as well as  palpable radial pulse and this was confirmed with Doppler. Satisfied we obtained hemostasis and wound closed in 2 layers with 3-0 Vicryl for Monocryl. Patient tolerated procedure well without immediate complication. All counts were correct at completion.    Rocco Kerkhoff C. Randie Heinz, MD [facility] Office: [phone] Pager: [phone]

## 2016-12-12 NOTE — H&P (Signed)
HISTORY AND PHYSICAL   History of Present Illness:    James Kim is a 41 y.o. (1975/10/31) male  who presents for placement of a permanent hemodialysis access.  He has used peritoneal dialysis for the past 3 years until he got an infection.  The patient is right handed.  The patient is currently on hemodialysis via right IJ  M-W-F.  The cause of renal failure is thought to be secondary to HTN and birth defect of one functioning kidney.  Other chronic medical problems include HTN, gout, RA, Dyslipidemia.      Past Medical History:  Diagnosis Date  . Anxiety   . Arthritis    "from my knees down" (08/08/2016)  . Childhood asthma   . 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 Dialysis-associated peritonitis (HCC) 08/08/2016  . ESRD on peritoneal dialysis (HCC)    "01/29/2014-08/07/2016" (08/08/2016)  . GERD (gastroesophageal reflux disease)   . Heel spur    "both feet" (08/08/2016)  . Hypertension   . Noncompliance   . OSA on CPAP   . Pain management    "for pain BLE; knees down thru feet" (08/08/2016)  . Peritoneal dialysis catheter in place [facility])          Past Surgical History:  Procedure Laterality Date  . CAPD REMOVAL N/A 08/10/2016   Procedure: CONTINUOUS AMBULATORY PERITONEAL DIALYSIS  (CAPD) CATHETER REMOVAL;  Surgeon: Claud Kelp, MD;  Location: [facility] OR;  Service: General;  Laterality: N/A;  . DIALYSIS/PERMA CATHETER INSERTION Right 08/03/2016  . INSERTION OF DIALYSIS CATHETER  ~ 2015   peritoneal dialysis     Social History      Social History  Substance Use Topics  . Smoking status: Never Smoker  . Smokeless tobacco: Current User    Types: Snuff  . Alcohol use No    Family History      Family History  Problem Relation Age of Onset  . Stroke Mother   . Diabetes Father     Allergies      Allergies  Allergen Reactions  . Sulfa Antibiotics Anaphylaxis, Itching and Rash           Current Outpatient  Prescriptions  Medication Sig Dispense Refill  . allopurinol (ZYLOPRIM) 100 MG tablet Take 100 mg by mouth at bedtime.     . ALPRAZolam (XANAX) 0.5 MG tablet Take 1 tablet (0.5 mg total) by mouth 2 (two) times daily as needed for anxiety. 60 tablet 2  . amLODipine (NORVASC) 10 MG tablet Take 1 tablet (10 mg total) by mouth every other day. 30 tablet 2  . escitalopram (LEXAPRO) 20 MG tablet Take 20 mg by mouth at bedtime.     . furosemide (LASIX) 20 MG tablet Take 2 tablets (40 mg total) by mouth 2 (two) times daily. 60 tablet 2  . metoprolol (LOPRESSOR) 50 MG tablet Take 50 mg by mouth See admin instructions. Take 50 mg by mouth twice daily every other day    . Multiple Vitamins-Minerals (MULTIVITAMIN PO) Take 1 tablet by mouth daily.    Marland Kitchen oxyCODONE (OXYCONTIN) 30 MG 12 hr tablet Take 30 mg by mouth 2 (two) times daily. 60 each 0  . oxyCODONE-acetaminophen (PERCOCET) 10-325 MG tablet Take 1 tablet by mouth 5 (five) times daily as needed for pain. 150 tablet 0  . pantoprazole (PROTONIX) 40 MG tablet Take 40 mg by mouth daily before breakfast.    . pregabalin (LYRICA) 25 MG capsule Take 1  capsule (25 mg total) by mouth 2 (two) times daily as needed (for fibromyalgia flare-up).    . sevelamer carbonate (RENVELA) 800 MG tablet Take 2 tablets (1,600 mg total) by mouth 3 (three) times daily with meals. 180 tablet 0  . Vitamin D, Ergocalciferol, (DRISDOL) 50000 UNITS CAPS capsule Take 50,000 Units by mouth every Wednesday.      No current facility-administered medications for this visit.     ROS:   General:  No weight loss, Fever, chills  HEENT: No recent headaches, no nasal bleeding, no visual changes, no sore throat  Neurologic: No dizziness, blackouts, seizures. No recent symptoms of stroke or mini- stroke. No recent episodes of slurred speech, or temporary blindness.  Cardiac: No recent episodes of chest pain/pressure, no shortness of breath at rest.  No shortness of  breath with exertion.  Denies history of atrial fibrillation or irregular heartbeat  Vascular: No history of rest pain in feet.  No history of claudication.  No history of non-healing ulcer, No history of DVT   Pulmonary: No home oxygen, no productive cough, no hemoptysis,  No asthma or wheezing  Musculoskeletal:  [x ] Arthritis, [ ]  Low back pain,  [ ]  Joint pain  Hematologic:No history of hypercoagulable state.  No history of easy bleeding.  No history of anemia  Gastrointestinal: No hematochezia or melena,  No gastroesophageal reflux, no trouble swallowing  Urinary: [x ] chronic Kidney disease, [x ] on HD - [ x] MWF or [ ]  TTHS, [ ]  Burning with urination, [ ]  Frequent urination, [ ]  Difficulty urinating;   Skin: No rashes  Psychological: No history of anxiety,  No history of depression   Physical Examination     Vitals:   10/20/16 1314  BP: (!) 150/86  Pulse: 83  Resp: 20  Temp: 98.2 F (36.8 C)  TempSrc: Oral  SpO2: 100%  Weight: 197 lb 6.4 oz (89.5 kg)  Height: 5\' 6"  (1.676 m)    Body mass index is 31.86 kg/m.  General:  Alert and oriented, no acute distress HEENT: Normal Neck: No bruit or JVD Pulmonary: Clear to auscultation bilaterally Cardiac: Regular Rate and Rhythm without murmur Gastrointestinal: Soft, non-tender, non-distended, no mass, no scars Skin: No rash Extremity Pulses:  2+ radial, brachial pulses bilaterally Musculoskeletal: No deformity or edema      Neurologic: Upper and lower extremity motor 5/5 and symmetric Psychiatric: Judgment intact, Mood & affect appropriate for pt's clinical situation Lymph : No Cervical, Axillary, or Inguinal lymphadenopathy   DATA:  He has an acceptable right cephalic and B basilic's. 4-5 mm in size per vein mapping in [facility].   ASSESSMENT:  ESRD   PLAN: We will schedule him for a right Brachio cephalic AV fistula verses graft. Discussed risks and benefits and he agrees to proceed.     Ayomide Zuleta C. , MD [facility] [place] Office: [phone] Pager: [phone]

## 2016-12-12 NOTE — Anesthesia Preprocedure Evaluation (Signed)
Anesthesia Evaluation  Patient identified by MRN, date of birth, ID band Patient awake    Reviewed: Allergy & Precautions, NPO status , Patient's Chart, lab work & pertinent test results  Airway Mallampati: II  TM Distance: >3 FB     Dental   Pulmonary asthma , sleep apnea ,    breath sounds clear to auscultation       Cardiovascular hypertension,  Rhythm:Regular Rate:Normal     Neuro/Psych    GI/Hepatic Neg liver ROS, GERD  ,  Endo/Other    Renal/GU Renal disease     Musculoskeletal  (+) Arthritis ,   Abdominal   Peds  Hematology  (+) anemia ,   Anesthesia Other Findings   Reproductive/Obstetrics                            Anesthesia Physical Anesthesia Plan  ASA: III  Anesthesia Plan: General   Post-op Pain Management:    Induction: Intravenous  PONV Risk Score and Plan: 2 and Ondansetron, Dexamethasone, Treatment may vary due to age or medical condition and Propofol infusion  Airway Management Planned:   Additional Equipment:   Intra-op Plan:   Post-operative Plan: Extubation in OR  Informed Consent: I have reviewed the patients History and Physical, chart, labs and discussed the procedure including the risks, benefits and alternatives for the proposed anesthesia with the patient or authorized representative who has indicated his/her understanding and acceptance.   Dental advisory given  Plan Discussed with: CRNA and Anesthesiologist  Anesthesia Plan Comments:         Anesthesia Quick Evaluation

## 2016-12-12 NOTE — Anesthesia Postprocedure Evaluation (Signed)
Anesthesia Post Note  Patient: James Kim  Procedure(s) Performed: Procedure(s) (LRB): ARTERIOVENOUS  FISTULA CREATION-RIGHT BRACHIOCEPHALIC (Right)     Patient location during evaluation: PACU Anesthesia Type: General Level of consciousness: awake Pain management: pain level controlled Vital Signs Assessment: post-procedure vital signs reviewed and stable Respiratory status: spontaneous breathing Cardiovascular status: stable Anesthetic complications: no    Last Vitals:  Vitals:   12/12/16 1104 12/12/16 1118  BP:  96/62  Pulse:  66  Resp:  12  Temp: 37 C     Last Pain:  Vitals:   12/12/16 1104  TempSrc:   PainSc: Asleep                 Meital Riehl

## 2016-12-13 ENCOUNTER — Telehealth: Payer: Self-pay | Admitting: Vascular Surgery

## 2016-12-13 ENCOUNTER — Encounter (HOSPITAL_COMMUNITY): Payer: Self-pay | Admitting: Vascular Surgery

## 2016-12-13 DIAGNOSIS — N2581 Secondary hyperparathyroidism of renal origin: Secondary | ICD-10-CM | POA: Diagnosis not present

## 2016-12-13 DIAGNOSIS — N186 End stage renal disease: Secondary | ICD-10-CM | POA: Diagnosis not present

## 2016-12-13 DIAGNOSIS — D500 Iron deficiency anemia secondary to blood loss (chronic): Secondary | ICD-10-CM | POA: Diagnosis not present

## 2016-12-13 DIAGNOSIS — T82898D Other specified complication of vascular prosthetic devices, implants and grafts, subsequent encounter: Secondary | ICD-10-CM | POA: Diagnosis not present

## 2016-12-13 DIAGNOSIS — D631 Anemia in chronic kidney disease: Secondary | ICD-10-CM | POA: Diagnosis not present

## 2016-12-13 DIAGNOSIS — R5081 Fever presenting with conditions classified elsewhere: Secondary | ICD-10-CM | POA: Diagnosis not present

## 2016-12-13 NOTE — Telephone Encounter (Signed)
Sched appt 02/02/17; lab at 2:00 and MD at 3:00. Ph# not going through, mailed appt letter.

## 2016-12-13 NOTE — Telephone Encounter (Signed)
-----   Message from Sharee Pimple, RN sent at 12/12/2016 11:20 AM EDT ----- Regarding: 6 weeks w/ duplex   ----- Message ----- From: Maeola Harman, MD Sent: 12/12/2016  10:54 AM To: [facility] [address]  KOURTLAND COOPMAN [id number] 10-Dec-1975  12/12/2016 Pre-operative Diagnosis: esrd  Surgeon:  Luanna Salk. Randie Heinz, MD Assistant: OR nurse  Procedure Performed:  Right brachiocephalic av fistula creation  F/u in 6 weeks with dialysis duplex

## 2016-12-16 DIAGNOSIS — N2581 Secondary hyperparathyroidism of renal origin: Secondary | ICD-10-CM | POA: Diagnosis not present

## 2016-12-16 DIAGNOSIS — D500 Iron deficiency anemia secondary to blood loss (chronic): Secondary | ICD-10-CM | POA: Diagnosis not present

## 2016-12-16 DIAGNOSIS — R5081 Fever presenting with conditions classified elsewhere: Secondary | ICD-10-CM | POA: Diagnosis not present

## 2016-12-16 DIAGNOSIS — D631 Anemia in chronic kidney disease: Secondary | ICD-10-CM | POA: Diagnosis not present

## 2016-12-16 DIAGNOSIS — N186 End stage renal disease: Secondary | ICD-10-CM | POA: Diagnosis not present

## 2016-12-18 DIAGNOSIS — D631 Anemia in chronic kidney disease: Secondary | ICD-10-CM | POA: Diagnosis not present

## 2016-12-18 DIAGNOSIS — N186 End stage renal disease: Secondary | ICD-10-CM | POA: Diagnosis not present

## 2016-12-18 DIAGNOSIS — N2581 Secondary hyperparathyroidism of renal origin: Secondary | ICD-10-CM | POA: Diagnosis not present

## 2016-12-18 DIAGNOSIS — D500 Iron deficiency anemia secondary to blood loss (chronic): Secondary | ICD-10-CM | POA: Diagnosis not present

## 2016-12-18 DIAGNOSIS — R5081 Fever presenting with conditions classified elsewhere: Secondary | ICD-10-CM | POA: Diagnosis not present

## 2016-12-19 ENCOUNTER — Encounter
Payer: BLUE CROSS/BLUE SHIELD | Attending: Physical Medicine & Rehabilitation | Admitting: Physical Medicine & Rehabilitation

## 2016-12-19 DIAGNOSIS — M1009 Idiopathic gout, multiple sites: Secondary | ICD-10-CM | POA: Insufficient documentation

## 2016-12-19 DIAGNOSIS — Z5181 Encounter for therapeutic drug level monitoring: Secondary | ICD-10-CM | POA: Insufficient documentation

## 2016-12-19 DIAGNOSIS — M722 Plantar fascial fibromatosis: Secondary | ICD-10-CM | POA: Insufficient documentation

## 2016-12-19 DIAGNOSIS — Z79899 Other long term (current) drug therapy: Secondary | ICD-10-CM | POA: Insufficient documentation

## 2016-12-19 DIAGNOSIS — N185 Chronic kidney disease, stage 5: Secondary | ICD-10-CM | POA: Insufficient documentation

## 2016-12-20 ENCOUNTER — Encounter (HOSPITAL_COMMUNITY): Payer: Self-pay | Admitting: *Deleted

## 2016-12-20 ENCOUNTER — Emergency Department (HOSPITAL_COMMUNITY)
Admission: EM | Admit: 2016-12-20 | Discharge: 2016-12-21 | Disposition: A | Discharge status: Home / Self Care | Payer: Medicare Other | Attending: Emergency Medicine | Admitting: Emergency Medicine

## 2016-12-20 DIAGNOSIS — N186 End stage renal disease: Secondary | ICD-10-CM | POA: Insufficient documentation

## 2016-12-20 DIAGNOSIS — G40909 Epilepsy, unspecified, not intractable, without status epilepticus: Secondary | ICD-10-CM | POA: Insufficient documentation

## 2016-12-20 DIAGNOSIS — Z992 Dependence on renal dialysis: Secondary | ICD-10-CM | POA: Diagnosis not present

## 2016-12-20 DIAGNOSIS — I120 Hypertensive chronic kidney disease with stage 5 chronic kidney disease or end stage renal disease: Secondary | ICD-10-CM | POA: Insufficient documentation

## 2016-12-20 DIAGNOSIS — R569 Unspecified convulsions: Secondary | ICD-10-CM | POA: Diagnosis not present

## 2016-12-20 DIAGNOSIS — Z79899 Other long term (current) drug therapy: Secondary | ICD-10-CM | POA: Diagnosis not present

## 2016-12-20 DIAGNOSIS — F17220 Nicotine dependence, chewing tobacco, uncomplicated: Secondary | ICD-10-CM | POA: Insufficient documentation

## 2016-12-20 DIAGNOSIS — E875 Hyperkalemia: Secondary | ICD-10-CM | POA: Diagnosis not present

## 2016-12-20 LAB — COMPREHENSIVE METABOLIC PANEL
ALK PHOS: 62 U/L (ref 38–126)
ALT: 15 U/L — AB (ref 17–63)
AST: 19 U/L (ref 15–41)
Albumin: 3.7 g/dL (ref 3.5–5.0)
Anion gap: 15 (ref 5–15)
BUN: 46 mg/dL — AB (ref 6–20)
CALCIUM: 9.7 mg/dL (ref 8.9–10.3)
CO2: 27 mmol/L (ref 22–32)
CREATININE: 10.73 mg/dL — AB (ref 0.61–1.24)
Chloride: 94 mmol/L — ABNORMAL LOW (ref 101–111)
GFR calc Af Amer: 6 mL/min — ABNORMAL LOW (ref >60)
GFR calc non Af Amer: 5 mL/min — ABNORMAL LOW (ref >60)
GLUCOSE: 81 mg/dL (ref 65–99)
Potassium: 5.7 mmol/L — ABNORMAL HIGH (ref 3.5–5.1)
Sodium: 136 mmol/L (ref 135–145)
Total Bilirubin: 0.7 mg/dL (ref 0.3–1.2)
Total Protein: 8.2 g/dL — ABNORMAL HIGH (ref 6.5–8.1)

## 2016-12-20 LAB — CBC WITH DIFFERENTIAL/PLATELET
BASOS ABS: 0 10*3/uL (ref 0.0–0.1)
Basophils Relative: 0 %
Eosinophils Absolute: 0 10*3/uL (ref 0.0–0.7)
Eosinophils Relative: 0 %
HEMATOCRIT: 37.2 % — AB (ref 39.0–52.0)
Hemoglobin: 12 g/dL — ABNORMAL LOW (ref 13.0–17.0)
LYMPHS PCT: 18 %
Lymphs Abs: 1 10*3/uL (ref 0.7–4.0)
MCH: 29.5 pg (ref 26.0–34.0)
MCHC: 32.3 g/dL (ref 30.0–36.0)
MCV: 91.4 fL (ref 78.0–100.0)
MONO ABS: 0.5 10*3/uL (ref 0.1–1.0)
MONOS PCT: 9 %
NEUTROS ABS: 4.1 10*3/uL (ref 1.7–7.7)
Neutrophils Relative %: 73 %
Platelets: 188 10*3/uL (ref 150–400)
RBC: 4.07 MIL/uL — ABNORMAL LOW (ref 4.22–5.81)
RDW: 14.1 % (ref 11.5–15.5)
WBC: 5.6 10*3/uL (ref 4.0–10.5)

## 2016-12-20 LAB — MAGNESIUM: Magnesium: 2.8 mg/dL — ABNORMAL HIGH (ref 1.7–2.4)

## 2016-12-20 LAB — ETHANOL: Alcohol, Ethyl (B): <5 mg/dL (ref <5)

## 2016-12-20 LAB — TROPONIN I: Troponin I: <0.03 ng/mL (ref <0.03)

## 2016-12-20 NOTE — ED Notes (Signed)
Pt does report feeling dizzy earlier in the day, denies any issues with n/v

## 2016-12-20 NOTE — ED Triage Notes (Signed)
Pt states that he had an episode at home where his family reports that pt had his eyes roll back into his head and would not respond to her and had bitten his tongue, pt was taken to [facility] but the wait was too long and pt left [facility]a, denies any hx of seizures,

## 2016-12-21 ENCOUNTER — Emergency Department (HOSPITAL_COMMUNITY): Payer: Medicare Other

## 2016-12-21 DIAGNOSIS — N186 End stage renal disease: Secondary | ICD-10-CM | POA: Diagnosis not present

## 2016-12-21 DIAGNOSIS — R5081 Fever presenting with conditions classified elsewhere: Secondary | ICD-10-CM | POA: Diagnosis not present

## 2016-12-21 DIAGNOSIS — D500 Iron deficiency anemia secondary to blood loss (chronic): Secondary | ICD-10-CM | POA: Diagnosis not present

## 2016-12-21 DIAGNOSIS — N2581 Secondary hyperparathyroidism of renal origin: Secondary | ICD-10-CM | POA: Diagnosis not present

## 2016-12-21 DIAGNOSIS — G40909 Epilepsy, unspecified, not intractable, without status epilepticus: Secondary | ICD-10-CM | POA: Diagnosis not present

## 2016-12-21 DIAGNOSIS — D631 Anemia in chronic kidney disease: Secondary | ICD-10-CM | POA: Diagnosis not present

## 2016-12-21 MED ORDER — SODIUM POLYSTYRENE SULFONATE 15 GM/60ML PO SUSP
15.0000 g | Freq: Once | ORAL | Status: AC
  Administered 2016-12-21: 15 g via ORAL
  Filled 2016-12-21: qty 60

## 2016-12-21 NOTE — ED Provider Notes (Addendum)
[facility] DEPT Provider Note   CSN: [id number] Arrival date & time: 12/20/16  2152  Time seen 23:40 PM    History   Chief Complaint Chief Complaint  Patient presents with  . Seizures    HPI James Kim is a 41 y.o. male.  HPI  Patient has a history of end-stage renal disease and gets dialysis on Monday, Wednesday (today) and Friday. He states however he didn't go today because he didn't feel well and he is going to go in the morning. He gets dialysis in [place]. He states he's been having what he calls dizzy spells for a long time. He states he had several dizzy spells today. He states they can happen when he is sitting or standing. They make him feel lightheaded and he denies a presyncope feeling. He denies a spinning sensation. He states they normally last 2-3 hours.Tonight he had been watching TV and his wife caught him to come to dinner. He was sitting at the table and he was talking to his family and all of a sudden got lightheaded just like he has in the past. However tonight his wife states his eyes rolled back in his head and he started shaking all over for about 2 minutes and his face got red. She states he then got limp and started to slump out of the chair and she helped him to the floor. She states he immediately became alert and oriented and did not have a confusion state. Patient has decreased urinary output however he states he did have a small amount of urinary incontinence. He also states he bit his tongue.Wife states he was not foaming at the mouth. He denies any pain at this time He states he feels fine. He denies family history of seizures. He denies any change in his medications.  Patient states he had a congenitally bad kidney and had hypertension that was hard to control for a long time.  PCP Beatriz Stallion, MD   Past Medical History:  Diagnosis Date  . Anxiety   . Arthritis    "from my knees down" (08/08/2016)- RA  . Childhood asthma   .  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 Dialysis-associated peritonitis (HCC) 08/08/2016  . Eczema   . ESRD on peritoneal dialysis (HCC)    "01/29/2014-08/07/2016" (08/08/2016)  . GERD (gastroesophageal reflux disease)   . Heel spur    "both feet" (08/08/2016)  . Hypertension   . Noncompliance   . OSA on CPAP   . Pain management    "for pain BLE; knees down thru feet" (08/08/2016)  . Peritoneal dialysis catheter in place [facility])   . Restless leg     Patient Active Problem List   Diagnosis Date Noted  . Opioid intoxication (HCC) 11/19/2016  . Metabolic acidosis with respiratory acidosis 11/15/2016  . Altered mental status 11/15/2016  . ESRD (end stage renal disease) (HCC) 10/06/2016  . Noncompliance 10/06/2016  . Anemia of chronic disease 10/06/2016  . Volume overload 10/06/2016  . Secondary hyperparathyroidism (HCC) 10/06/2016  . Hyperkalemia 10/05/2016  . Essential hypertension 10/05/2016  . Acute diverticulitis 08/11/2016  . Spontaneous bacterial peritonitis (HCC) 08/10/2016  . Hypokalemia 08/09/2016  . Peritonitis associated with peritoneal dialysis (HCC) 08/09/2016  . Dialysis-associated peritonitis (HCC) 08/08/2016  . Nerve pain 08/30/2015  . Renal dialysis status 02/23/2014  . Obstructive sleep apnea of adult 12/22/2013  . Low serum cobalamin 12/22/2013  . Acid reflux 12/22/2013  . Dyslipidemia  12/22/2013  . Adiposity 12/19/2013  . BP (high blood pressure) 12/19/2013  . Focal and segmental hyalinosis 12/19/2013  . End stage kidney disease (HCC) 12/19/2013  . Chronic kidney disease 04/11/2013  . Gout, arthropathy 03/05/2013  . Rheumatoid arthritis (HCC) 03/05/2013  . Plantar fasciitis, bilateral 03/05/2013    Past Surgical History:  Procedure Laterality Date  . AV FISTULA PLACEMENT Right 12/12/2016   Procedure: ARTERIOVENOUS  FISTULA CREATION-RIGHT BRACHIOCEPHALIC;  Surgeon: Maeola Harman, MD;  Location: [facility] [place] OR;  Service: Vascular;  Laterality:  Right;  . CAPD REMOVAL N/A 08/10/2016   Procedure: CONTINUOUS AMBULATORY PERITONEAL DIALYSIS  (CAPD) CATHETER REMOVAL;  Surgeon: Claud Kelp, MD;  Location: [facility] OR;  Service: General;  Laterality: N/A;  . DIALYSIS/PERMA CATHETER INSERTION Right 08/03/2016  . INSERTION OF DIALYSIS CATHETER  ~ 2015   peritoneal dialysis  . Placement of Hemodialysis Catheter Left   . Removal of hemodialysis catheter         Home Medications    Prior to Admission medications   Medication Sig Start Date End Date Taking? Authorizing Provider  allopurinol (ZYLOPRIM) 100 MG tablet Take 1 tablet (100 mg total) by mouth every other day. Patient taking differently: Take 100 mg by mouth at bedtime.  11/19/16   Emokpae, Ejiroghene E, MD  ALPRAZolam (XANAX) 0.5 MG tablet Take 0.5 mg by mouth 2 (two) times daily as needed. For anxiety. 11/07/16   [provider]  amLODipine (NORVASC) 10 MG tablet Take 1 tablet (10 mg total) by mouth every other day. Patient taking differently: Take 10 mg by mouth at bedtime.  10/06/16   Rama, Maryruth Bun, MD  calcitRIOL (ROCALTROL) 0.25 MCG capsule Take 0.5 mcg by mouth at bedtime.    [provider]  cinacalcet (SENSIPAR) 30 MG tablet Take 30 mg by mouth daily with supper.    [provider]  escitalopram (LEXAPRO) 20 MG tablet Take 20 mg by mouth at bedtime.     [provider]  furosemide (LASIX) 20 MG tablet Take 2 tablets (40 mg total) by mouth 2 (two) times daily. Patient taking differently: Take 40 mg by mouth every other day.  10/06/16   Rama, Maryruth Bun, MD  gentamicin (GARAMYCIN) 1.6-0.9 MG/ML-% Inject 50 mLs (80 mg total) into the vein Every Tuesday,Thursday,and Saturday with dialysis. Patient not taking: Reported on 12/07/2016 11/21/16   Onnie Boer, MD  metoprolol tartrate (LOPRESSOR) 100 MG tablet Take 100 mg by mouth at bedtime.  11/07/16   [provider]  oxyCODONE-acetaminophen (PERCOCET) 10-325 MG tablet Take 1 tablet by  mouth 5 (five) times daily as needed. For pain. 12/06/16   [provider]  oxyCODONE-acetaminophen (ROXICET) 5-325 MG tablet Take 1-2 tablets by mouth every 6 (six) hours as needed. 12/12/16 12/12/17  Maeola Harman, MD  OXYCONTIN 30 MG 12 hr tablet Take 30 mg by mouth 2 (two) times daily. 12/06/16   [provider]  pantoprazole (PROTONIX) 40 MG tablet Take 40 mg by mouth at bedtime.     [provider]  pregabalin (LYRICA) 25 MG capsule Take 1 capsule (25 mg total) by mouth daily. Patient taking differently: Take 25 mg by mouth daily as needed (for nerve pain).  11/18/16   Onnie Boer, MD  promethazine (PHENERGAN) 12.5 MG tablet Take 12.5 mg by mouth at bedtime as needed for nausea or vomiting.  10/03/16   [provider]  sevelamer carbonate (RENVELA) 800 MG tablet Take 2 tablets (1,600 mg total) by mouth  3 (three) times daily with meals. 08/11/16   Renae Fickle, MD  Vitamin D, Ergocalciferol, (DRISDOL) 50000 UNITS CAPS capsule Take 50,000 Units by mouth every Wednesday.     [provider]    Family History Family History  Problem Relation Age of Onset  . Stroke Mother   . Diabetes Father     Social History Social History  Substance Use Topics  . Smoking status: Never Smoker  . Smokeless tobacco: Current User    Types: Snuff     Comment: snuff- somedays  . Alcohol use No  lives at home Lives with spouse   Allergies   Sulfa antibiotics   Review of Systems Review of Systems  All other systems reviewed and are negative.    Physical Exam Updated Vital Signs BP (!) 144/85   Pulse 87   Temp 98.2 F (36.8 C)   Resp 18   Ht 5\' 6"  (1.676 m)   Wt 79.4 kg (175 lb)   SpO2 100%   BMI 28.25 kg/m   Vital signs normal    Physical Exam  Constitutional: He is oriented to person, place, and time. He appears well-developed and well-nourished.  Non-toxic appearance. He does not appear ill. No distress.  HENT:  Head:  Normocephalic.  Right Ear: External ear normal.  Left Ear: External ear normal.  Nose: Nose normal. No mucosal edema or rhinorrhea.  Mouth/Throat: Oropharynx is clear and moist and mucous membranes are normal. No dental abscesses or uvula swelling.  Patient has some bruising along the left side of his tongue without laceration  Eyes: Pupils are equal, round, and reactive to light. Conjunctivae and EOM are normal.  Neck: Normal range of motion and full passive range of motion without pain. Neck supple.  Cardiovascular: Normal rate, regular rhythm and normal heart sounds.  Exam reveals no gallop and no friction rub.   No murmur heard. Pulmonary/Chest: Effort normal and breath sounds normal. No respiratory distress. He has no wheezes. He has no rhonchi. He has no rales. He exhibits no tenderness and no crepitus.  Abdominal: Soft. Normal appearance and bowel sounds are normal. He exhibits no distension. There is no tenderness. There is no rebound and no guarding.  Musculoskeletal: Normal range of motion. He exhibits no edema or tenderness.  Moves all extremities well.   Neurological: He is alert and oriented to person, place, and time. He has normal strength. No cranial nerve deficit.  Skin: Skin is warm, dry and intact. No rash noted. No erythema. No pallor.  Psychiatric: He has a normal mood and affect. His speech is normal and behavior is normal. His mood appears not anxious.  Nursing note and vitals reviewed.    ED Treatments / Results  Labs (all labs ordered are listed, but only abnormal results are displayed) Results for orders placed or performed during the hospital encounter of 12/20/16  CBC with Differential  Result Value Ref Range   WBC 5.6 4.0 - 10.5 K/uL   RBC 4.07 (L) 4.22 - 5.81 MIL/uL   Hemoglobin 12.0 (L) 13.0 - 17.0 g/dL   HCT 16.1 (L) 09.6 - 04.5 %   MCV 91.4 78.0 - 100.0 fL   MCH 29.5 26.0 - 34.0 pg   MCHC 32.3 30.0 - 36.0 g/dL   RDW 40.9 81.1 - 91.4 %   Platelets  188 150 - 400 K/uL   Neutrophils Relative % 73 %   Neutro Abs 4.1 1.7 - 7.7 K/uL   Lymphocytes Relative 18 %  Lymphs Abs 1.0 0.7 - 4.0 K/uL   Monocytes Relative 9 %   Monocytes Absolute 0.5 0.1 - 1.0 K/uL   Eosinophils Relative 0 %   Eosinophils Absolute 0.0 0.0 - 0.7 K/uL   Basophils Relative 0 %   Basophils Absolute 0.0 0.0 - 0.1 K/uL  Comprehensive metabolic panel  Result Value Ref Range   Sodium 136 135 - 145 mmol/L   Potassium 5.7 (H) 3.5 - 5.1 mmol/L   Chloride 94 (L) 101 - 111 mmol/L   CO2 27 22 - 32 mmol/L   Glucose, Bld 81 65 - 99 mg/dL   BUN 46 (H) 6 - 20 mg/dL   Creatinine, Ser 89.37 (H) 0.61 - 1.24 mg/dL   Calcium 9.7 8.9 - 34.2 mg/dL   Total Protein 8.2 (H) 6.5 - 8.1 g/dL   Albumin 3.7 3.5 - 5.0 g/dL   AST 19 15 - 41 U/L   ALT 15 (L) 17 - 63 U/L   Alkaline Phosphatase 62 38 - 126 U/L   Total Bilirubin 0.7 0.3 - 1.2 mg/dL   GFR calc non Af Amer 5 (L) >60 mL/min   GFR calc Af Amer 6 (L) >60 mL/min   Anion gap 15 5 - 15  Troponin I  Result Value Ref Range   Troponin I <0.03 <0.03 ng/mL  Ethanol  Result Value Ref Range   Alcohol, Ethyl (B) <5 <5 mg/dL  Magnesium  Result Value Ref Range   Magnesium 2.8 (H) 1.7 - 2.4 mg/dL   Laboratory interpretation all normal except ild hyperkalemia for dialysis patient, chronic renal failure, mild anemia    EKG  EKG Interpretation  Date/Time:  Wednesday December 20 2016 23:57:01 EDT Ventricular Rate:  70 PR Interval:    QRS Duration: 97 QT Interval:  397 QTC Calculation: 429 R Axis:   77 Text Interpretation:  Sinus rhythm ST elev, probable normal early repol pattern mild peaking of T waves since 15 Nov 2016 Confirmed by Devoria Albe ([id number]) on 12/21/2016 12:08:22 AM       Radiology Ct Head Wo Contrast  Result Date: 12/21/2016 CLINICAL DATA:  Seizure EXAM: CT HEAD WITHOUT CONTRAST TECHNIQUE: Contiguous axial images were obtained from the base of the skull through the vertex without intravenous contrast. COMPARISON:   Report 11/15/2016 FINDINGS: Brain: No evidence of acute infarction, hemorrhage, hydrocephalus, extra-axial collection or mass lesion/mass effect. Vascular: No hyperdense vessel or unexpected calcification. Skull: Normal. Negative for fracture or focal lesion. Sinuses/Orbits: No acute finding. Other: None IMPRESSION: No CT evidence for acute intracranial abnormality. Electronically Signed   By: Jasmine Pang M.D.   On: 12/21/2016 01:06    Procedures Procedures (including critical care time)  Medications Ordered in ED Medications  sodium polystyrene (KAYEXALATE) 15 GM/60ML suspension 15 g (15 g Oral Given 12/21/16 0147)     Initial Impression / Assessment and Plan / ED Course  I have reviewed the triage vital signs and the nursing notes.  Pertinent labs & imaging results that were available during my care of the patient were reviewed by me and considered in my medical decision making (see chart for details).      Wife gets a good description of a seizure.  Patient remained seizure-free during his ED visit. He was noted to have some mild hyperkalemia consistent with hisend-stage renal disease however he did have some peaking of his T waves comparedto old EKG. He was given Kayexalate 15 g orally because he will be done twice in a few hours.  Patient was given the results of his testing. She'll follow up with a neurologist to get further evaluation. They were advised if he has another seizure he should be seeing the nurse emergency department to get started on seizure medication. He should not drive or climb ladders or do any activity where he could get hurt. Had a seizure.   Final Clinical Impressions(s) / ED Diagnoses   Final diagnoses:  Seizure-like activity (HCC)  New onset seizure (HCC)  Hyperkalemia    Plan discharge  Devoria Albe, MD, Concha Pyo, MD 12/21/16 4098    Devoria Albe, MD 12/21/16 [phone]

## 2016-12-21 NOTE — ED Notes (Signed)
Informed pt of need for urine sample. Pt states he will attempt to provide one, but due to being a dialysis pt he has very low urine output and has already urinated once today

## 2016-12-21 NOTE — ED Notes (Signed)
Pt unable to sign for discharge-computers down

## 2016-12-21 NOTE — Discharge Instructions (Signed)
You need to keep your dialysis appointment in the morning because your potassium was high tonight. It was 5.6 with upper normal being 5.1. Your wife describes a seizure tonight. You should not drive.You need to have further evaluation and further testing done by a neurologist, you can either see Dr Gerilyn Pilgrim, or have your primary care doctor refer you to someone in [place], [place]. NO DRIVING, CLIMBING LADDERS or any activity that you could get hurt if you have another seizure.  If you should have another seizure, you should go to the nearest ED to get started on seizure medication.

## 2016-12-26 DIAGNOSIS — D631 Anemia in chronic kidney disease: Secondary | ICD-10-CM | POA: Diagnosis not present

## 2016-12-26 DIAGNOSIS — R5081 Fever presenting with conditions classified elsewhere: Secondary | ICD-10-CM | POA: Diagnosis not present

## 2016-12-26 DIAGNOSIS — N186 End stage renal disease: Secondary | ICD-10-CM | POA: Diagnosis not present

## 2016-12-26 DIAGNOSIS — N2581 Secondary hyperparathyroidism of renal origin: Secondary | ICD-10-CM | POA: Diagnosis not present

## 2016-12-26 DIAGNOSIS — D500 Iron deficiency anemia secondary to blood loss (chronic): Secondary | ICD-10-CM | POA: Diagnosis not present

## 2016-12-28 DIAGNOSIS — D631 Anemia in chronic kidney disease: Secondary | ICD-10-CM | POA: Diagnosis not present

## 2016-12-28 DIAGNOSIS — D500 Iron deficiency anemia secondary to blood loss (chronic): Secondary | ICD-10-CM | POA: Diagnosis not present

## 2016-12-28 DIAGNOSIS — N186 End stage renal disease: Secondary | ICD-10-CM | POA: Diagnosis not present

## 2016-12-28 DIAGNOSIS — N2581 Secondary hyperparathyroidism of renal origin: Secondary | ICD-10-CM | POA: Diagnosis not present

## 2016-12-28 DIAGNOSIS — R5081 Fever presenting with conditions classified elsewhere: Secondary | ICD-10-CM | POA: Diagnosis not present

## 2017-01-04 ENCOUNTER — Telehealth: Payer: Self-pay | Admitting: Physical Medicine & Rehabilitation

## 2017-01-04 NOTE — Telephone Encounter (Signed)
Patient went to hospital last Wednesday and his medication was either solen or misplaced.  Can't seem to locate it anywhere.  Please call patient.

## 2017-01-04 NOTE — Telephone Encounter (Signed)
Mr Kelnhofer reports that he went to the ED last week with seizure activity and  They pulled his medication out of the safe and gave everything to the EMS when he was transported.  He lost all of his medication (not just pain medication).  He reports that he called the EMS [facility]) and they say "they have not seen medication and for him to call the police".  I told him that yes he has to report the medication as stolen to get a police report when medication is possibly stolen by our policy to get ir replaced. But I encouraged him to follow up with the EMS to file a report with them as well. Have not seen medication is not a satisfactory answer and chain of custody needs to be followed especially when narcotics are involved.  That aside, he has been without his medication since last Wednesday because he did not want to get into trouble with our office. I explained that he was not in trouble but yes he needs to follow up on his medications with the EMS and hospital ED.  I will send message to [facility] to evaluate.

## 2017-01-05 ENCOUNTER — Telehealth: Payer: Self-pay

## 2017-01-05 ENCOUNTER — Telehealth: Payer: Self-pay | Admitting: *Deleted

## 2017-01-05 ENCOUNTER — Telehealth: Payer: Self-pay | Admitting: Registered Nurse

## 2017-01-05 DIAGNOSIS — D500 Iron deficiency anemia secondary to blood loss (chronic): Secondary | ICD-10-CM | POA: Diagnosis not present

## 2017-01-05 DIAGNOSIS — Z992 Dependence on renal dialysis: Secondary | ICD-10-CM | POA: Diagnosis not present

## 2017-01-05 DIAGNOSIS — R5081 Fever presenting with conditions classified elsewhere: Secondary | ICD-10-CM | POA: Diagnosis not present

## 2017-01-05 DIAGNOSIS — N2581 Secondary hyperparathyroidism of renal origin: Secondary | ICD-10-CM | POA: Diagnosis not present

## 2017-01-05 DIAGNOSIS — N186 End stage renal disease: Secondary | ICD-10-CM | POA: Diagnosis not present

## 2017-01-05 DIAGNOSIS — D631 Anemia in chronic kidney disease: Secondary | ICD-10-CM | POA: Diagnosis not present

## 2017-01-05 MED ORDER — OXYCODONE-ACETAMINOPHEN 10-325 MG PO TABS: 1.0000 | ORAL_TABLET | Freq: Every day | ORAL | 0 refills | Status: DC | PRN

## 2017-01-05 MED ORDER — OXYCONTIN 30 MG PO T12A: 30.0000 mg | EXTENDED_RELEASE_TABLET | Freq: Two times a day (BID) | ORAL | 0 refills | Status: DC

## 2017-01-05 NOTE — Telephone Encounter (Signed)
We have had difficulty locating James Kim in [place] for purposes of tracking his narcotic fills. After speaking with [facility] there is an issue with some of his insurance coverage being under incorrect birthdate. So he has some fills under the brithdate in our records (July 28, 1975) and some and under another date (10/13/1975)  James Kim has been asked multiple times to have this corrected but it has not been done. This is an FYI message to assist in future look ups if we are having trouble locating his controlled substance records.

## 2017-01-05 NOTE — Telephone Encounter (Signed)
Mrs. James Kim called office, stating she has been calling [facility] to find out what happen to James Kim analgesics.  NCCSR was reviewed, James Kim Medications Oxycontin and Oxycodone was picked up on 12/06/2016. She was instructed to come to office to pick up prescription today, she verbalizes understanding. His dialysis day were changed he dialyzes on Tuesday/ Thursday and Saturday: His September appointment changed to 01/10/2017 at 10:30, she verbalizes understanding.

## 2017-01-05 NOTE — Telephone Encounter (Signed)
Placed a call to Mr. Gander, no answer. The Voicemail has not been set up. Will try to call again.

## 2017-01-05 NOTE — Telephone Encounter (Signed)
Placed another call to James Kim no answer. Called his pharmacy and obtain a telephone number [phone]. Have tried this number several times ringing busy , we will contiinue to make attemtps to reach James Kim.

## 2017-01-09 ENCOUNTER — Encounter: Payer: Medicare Other | Admitting: Registered Nurse

## 2017-01-10 ENCOUNTER — Encounter: Payer: Medicare Other | Attending: Physical Medicine & Rehabilitation | Admitting: Registered Nurse

## 2017-01-10 ENCOUNTER — Telehealth: Payer: Self-pay | Admitting: Registered Nurse

## 2017-01-10 ENCOUNTER — Encounter: Payer: Self-pay | Admitting: Registered Nurse

## 2017-01-10 VITALS — BP 160/82 | HR 91 | Resp 14

## 2017-01-10 DIAGNOSIS — M109 Gout, unspecified: Secondary | ICD-10-CM

## 2017-01-10 DIAGNOSIS — Z79899 Other long term (current) drug therapy: Secondary | ICD-10-CM

## 2017-01-10 DIAGNOSIS — M1009 Idiopathic gout, multiple sites: Secondary | ICD-10-CM | POA: Diagnosis not present

## 2017-01-10 DIAGNOSIS — N185 Chronic kidney disease, stage 5: Secondary | ICD-10-CM | POA: Insufficient documentation

## 2017-01-10 DIAGNOSIS — M722 Plantar fascial fibromatosis: Secondary | ICD-10-CM | POA: Diagnosis not present

## 2017-01-10 DIAGNOSIS — Z5181 Encounter for therapeutic drug level monitoring: Secondary | ICD-10-CM | POA: Diagnosis not present

## 2017-01-10 DIAGNOSIS — G894 Chronic pain syndrome: Secondary | ICD-10-CM | POA: Diagnosis not present

## 2017-01-10 DIAGNOSIS — F411 Generalized anxiety disorder: Secondary | ICD-10-CM | POA: Diagnosis not present

## 2017-01-10 MED ORDER — OXYCODONE-ACETAMINOPHEN 10-325 MG PO TABS: 1.0000 | ORAL_TABLET | Freq: Every day | ORAL | 0 refills | Status: DC | PRN

## 2017-01-10 MED ORDER — CLONAZEPAM 0.5 MG PO TABS: 0.5000 mg | ORAL_TABLET | Freq: Two times a day (BID) | ORAL | 1 refills | Status: DC | PRN

## 2017-01-10 MED ORDER — OXYCONTIN 30 MG PO T12A: 30.0000 mg | EXTENDED_RELEASE_TABLET | Freq: Two times a day (BID) | ORAL | 0 refills | Status: DC

## 2017-01-10 NOTE — Progress Notes (Signed)
Subjective:    Patient ID: James Kim, male    DOB: 08/16/1975, 41 y.o.   MRN: [id number]  HPI: James Kim is a 41year old male who returns for follow up appointmentfor chronic pain and medication refill. He states his pain is located in his bilateral lower extremities and bilateral feet. He rates his pain 4.His current exercise regime is walking.  In view of James Kim co morbidities,  multiple hospitalizations and loss of analgesics he will be seen on a monthly basis, he verbalizes understanding.   His last Oral swab was done on 10/09/2016, it was consistent.    Pain Inventory Average Pain 8 Pain Right Now 4 My pain is sharp, stabbing and aching  In the last 24 hours, has pain interfered with the following? General activity 8 Relation with others 6 Enjoyment of life 9 What TIME of day is your pain at its worst? morning, evening Sleep (in general) Poor  Pain is worse with: walking, standing and some activites Pain improves with: rest, heat/ice, medication and injections Relief from Meds: 5  Mobility walk without assistance use a walker ability to climb steps?  yes do you drive?  yes Do you have any goals in this area?  yes  Function disabled: date disabled . I need assistance with the following:  bathing, meal prep and household duties  Neuro/Psych trouble walking anxiety  Prior Studies Any changes since last visit?  no  Physicians involved in your care Any changes since last visit?  no   Family History  Problem Relation Age of Onset  . Stroke Mother   . Diabetes Father    Social History   Social History  . Marital status: Married    Spouse name: N/A  . Number of children: N/A  . Years of education: N/A   Social History Main Topics  . Smoking status: Never Smoker  . Smokeless tobacco: Current User    Types: Snuff     Comment: snuff- somedays  . Alcohol use No  . Drug use: No  . Sexual activity: Yes   Other Topics Concern    . None   Social History Narrative  . None   Past Surgical History:  Procedure Laterality Date  . AV FISTULA PLACEMENT Right 12/12/2016   Procedure: ARTERIOVENOUS  FISTULA CREATION-RIGHT BRACHIOCEPHALIC;  Surgeon: Maeola Harman, MD;  Location: [facility] OR;  Service: Vascular;  Laterality: Right;  . CAPD REMOVAL N/A 08/10/2016   Procedure: CONTINUOUS AMBULATORY PERITONEAL DIALYSIS  (CAPD) CATHETER REMOVAL;  Surgeon: Claud Kelp, MD;  Location: [facility] OR;  Service: General;  Laterality: N/A;  . DIALYSIS/PERMA CATHETER INSERTION Right 08/03/2016  . INSERTION OF DIALYSIS CATHETER  ~ 2015   peritoneal dialysis  . Placement of Hemodialysis Catheter Left   . Removal of hemodialysis catheter     Past Medical History:  Diagnosis Date  . Anxiety   . Arthritis    "from my knees down" (08/08/2016)- RA  . Childhood asthma   . 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 Dialysis-associated peritonitis (HCC) 08/08/2016  . Eczema   . ESRD on peritoneal dialysis (HCC)    "01/29/2014-08/07/2016" (08/08/2016)  . GERD (gastroesophageal reflux disease)   . Heel spur    "both feet" (08/08/2016)  . Hypertension   . Noncompliance   . OSA on CPAP   . Pain management    "for pain BLE; knees down thru feet" (08/08/2016)  . Peritoneal dialysis catheter in  place [facility])   . Restless leg    BP (!) 160/82 (BP Location: Left Arm, Patient Position: Sitting, Cuff Size: Normal)   Pulse 91   Resp 14   SpO2 95%   Opioid Risk Score:   Fall Risk Score:  `1  Depression screen PHQ 2/9  Depression screen [facility] 2/9 01/19/2016 08/03/2015 01/25/2015 08/10/2014  Decreased Interest 1 1 0 2  Down, Depressed, Hopeless 1 1 0 1  PHQ - 2 Score 2 2 0 3  Altered sleeping - 2 - 3  Tired, decreased energy - 1 - 3  Change in appetite - 1 - 3  Feeling bad or failure about yourself  - 0 - 1  Trouble concentrating - 0 - 2  Moving slowly or fidgety/restless - 0 - 0  Suicidal thoughts - 0 - 0  PHQ-9 Score - 6 - 15  Difficult  doing work/chores - Not difficult at all - -    Review of Systems  Constitutional: Positive for appetite change.  HENT: Negative.   Eyes: Negative.   Respiratory: Positive for apnea.   Cardiovascular: Positive for leg swelling.  Gastrointestinal: Positive for nausea.  Endocrine: Negative.   Genitourinary: Positive for difficulty urinating.  Musculoskeletal: Positive for gait problem and myalgias.  Skin: Negative.   Allergic/Immunologic: Negative.   Psychiatric/Behavioral: The patient is nervous/anxious.   All other systems reviewed and are negative.      Objective:   Physical Exam  Constitutional: He is oriented to person, place, and time. He appears well-developed and well-nourished.  HENT:  Head: Normocephalic and atraumatic.  Neck: Normal range of motion. Neck supple.  Cardiovascular: Normal rate and regular rhythm.   Pulmonary/Chest: Effort normal and breath sounds normal.  Musculoskeletal:  Normal Muscle Bulk and Muscle Testing Reveals: Upper Extremities: Full ROM and Muscle Strength 5/5 Lower Extremities: Full ROM and Muscle Strength 5/5 Bilateral Lower Extremities Flexion Produces Pain into Bilateral Feet Arises from chair slowly Antalgic gait  Neurological: He is alert and oriented to person, place, and time.  Skin: Skin is warm and dry.  Psychiatric: He has a normal mood and affect.  Nursing note and vitals reviewed.         Assessment & Plan:  1.Chronic foot pain with history of plantar fasciitis/ Gout Arthropathy: 01/10/2017 Refilled:Oxycodone 10/325 mg one tablet 5 times a dayas needed #150 and Oxycontin 30mg  one capsule every 12 hours #60. Continue Lyrica. We will continue the opioid monitoring program this consists of, regular clinic visits, examinations, urine drug screen, pill counts as well as use of Controlled Substance Reporting System. 2.Chronic Kidney Disease stage V:On Hemo-Dialysis/ Nephrology Following. 01/10/2017 3.  Anxiety: Continue Xanax until 09/20 according to NCCSR Xanax picked up on 12/28/2016 we will changed to Klonopin on 01/25/2017. 01/10/2017  20 minutes of face to face patient care time was spent during this visit. All questions were encouraged and answered.    F/U in 1 month

## 2017-01-10 NOTE — Patient Instructions (Signed)
  Call office on 01/25/2017 to call in your Klonopin, since you picked up your Xanax on 12/28/2016.

## 2017-01-10 NOTE — Telephone Encounter (Signed)
On 01/10/2017 the NCCSR was reviewed no conflict was seen on the [facility] Controlled Substance Reporting System with multiple prescribers. James Kim has a signed narcotic contract with our office. If there were any discrepancies this would have been reported to his physician.

## 2017-01-11 DIAGNOSIS — E8809 Other disorders of plasma-protein metabolism, not elsewhere classified: Secondary | ICD-10-CM | POA: Diagnosis not present

## 2017-01-11 DIAGNOSIS — D631 Anemia in chronic kidney disease: Secondary | ICD-10-CM | POA: Diagnosis not present

## 2017-01-11 DIAGNOSIS — D6959 Other secondary thrombocytopenia: Secondary | ICD-10-CM | POA: Diagnosis not present

## 2017-01-11 DIAGNOSIS — N186 End stage renal disease: Secondary | ICD-10-CM | POA: Diagnosis not present

## 2017-01-11 DIAGNOSIS — D500 Iron deficiency anemia secondary to blood loss (chronic): Secondary | ICD-10-CM | POA: Diagnosis not present

## 2017-01-11 DIAGNOSIS — N2581 Secondary hyperparathyroidism of renal origin: Secondary | ICD-10-CM | POA: Diagnosis not present

## 2017-01-13 DIAGNOSIS — E8809 Other disorders of plasma-protein metabolism, not elsewhere classified: Secondary | ICD-10-CM | POA: Diagnosis not present

## 2017-01-13 DIAGNOSIS — D631 Anemia in chronic kidney disease: Secondary | ICD-10-CM | POA: Diagnosis not present

## 2017-01-13 DIAGNOSIS — D500 Iron deficiency anemia secondary to blood loss (chronic): Secondary | ICD-10-CM | POA: Diagnosis not present

## 2017-01-13 DIAGNOSIS — N2581 Secondary hyperparathyroidism of renal origin: Secondary | ICD-10-CM | POA: Diagnosis not present

## 2017-01-13 DIAGNOSIS — N186 End stage renal disease: Secondary | ICD-10-CM | POA: Diagnosis not present

## 2017-01-16 DIAGNOSIS — D631 Anemia in chronic kidney disease: Secondary | ICD-10-CM | POA: Diagnosis not present

## 2017-01-16 DIAGNOSIS — N186 End stage renal disease: Secondary | ICD-10-CM | POA: Diagnosis not present

## 2017-01-16 DIAGNOSIS — E8809 Other disorders of plasma-protein metabolism, not elsewhere classified: Secondary | ICD-10-CM | POA: Diagnosis not present

## 2017-01-16 DIAGNOSIS — N2581 Secondary hyperparathyroidism of renal origin: Secondary | ICD-10-CM | POA: Diagnosis not present

## 2017-01-16 DIAGNOSIS — D500 Iron deficiency anemia secondary to blood loss (chronic): Secondary | ICD-10-CM | POA: Diagnosis not present

## 2017-01-18 DIAGNOSIS — D631 Anemia in chronic kidney disease: Secondary | ICD-10-CM | POA: Diagnosis not present

## 2017-01-18 DIAGNOSIS — N2581 Secondary hyperparathyroidism of renal origin: Secondary | ICD-10-CM | POA: Diagnosis not present

## 2017-01-18 DIAGNOSIS — E8809 Other disorders of plasma-protein metabolism, not elsewhere classified: Secondary | ICD-10-CM | POA: Diagnosis not present

## 2017-01-18 DIAGNOSIS — D500 Iron deficiency anemia secondary to blood loss (chronic): Secondary | ICD-10-CM | POA: Diagnosis not present

## 2017-01-18 DIAGNOSIS — N186 End stage renal disease: Secondary | ICD-10-CM | POA: Diagnosis not present

## 2017-01-19 ENCOUNTER — Other Ambulatory Visit: Payer: Self-pay

## 2017-01-19 DIAGNOSIS — S8001XA Contusion of right knee, initial encounter: Secondary | ICD-10-CM | POA: Diagnosis not present

## 2017-01-19 NOTE — Addendum Note (Signed)
Addended by: Burton Apley A on: 01/19/2017 02:32 PM   Modules accepted: Orders

## 2017-01-20 DIAGNOSIS — E8809 Other disorders of plasma-protein metabolism, not elsewhere classified: Secondary | ICD-10-CM | POA: Diagnosis not present

## 2017-01-20 DIAGNOSIS — N186 End stage renal disease: Secondary | ICD-10-CM | POA: Diagnosis not present

## 2017-01-20 DIAGNOSIS — D500 Iron deficiency anemia secondary to blood loss (chronic): Secondary | ICD-10-CM | POA: Diagnosis not present

## 2017-01-20 DIAGNOSIS — D631 Anemia in chronic kidney disease: Secondary | ICD-10-CM | POA: Diagnosis not present

## 2017-01-20 DIAGNOSIS — N2581 Secondary hyperparathyroidism of renal origin: Secondary | ICD-10-CM | POA: Diagnosis not present

## 2017-01-23 DIAGNOSIS — N2581 Secondary hyperparathyroidism of renal origin: Secondary | ICD-10-CM | POA: Diagnosis not present

## 2017-01-23 DIAGNOSIS — E8809 Other disorders of plasma-protein metabolism, not elsewhere classified: Secondary | ICD-10-CM | POA: Diagnosis not present

## 2017-01-23 DIAGNOSIS — D631 Anemia in chronic kidney disease: Secondary | ICD-10-CM | POA: Diagnosis not present

## 2017-01-23 DIAGNOSIS — D500 Iron deficiency anemia secondary to blood loss (chronic): Secondary | ICD-10-CM | POA: Diagnosis not present

## 2017-01-23 DIAGNOSIS — N186 End stage renal disease: Secondary | ICD-10-CM | POA: Diagnosis not present

## 2017-01-24 DIAGNOSIS — M2391 Unspecified internal derangement of right knee: Secondary | ICD-10-CM | POA: Diagnosis not present

## 2017-01-24 DIAGNOSIS — M25561 Pain in right knee: Secondary | ICD-10-CM | POA: Diagnosis not present

## 2017-01-24 DIAGNOSIS — G8929 Other chronic pain: Secondary | ICD-10-CM | POA: Diagnosis not present

## 2017-01-25 ENCOUNTER — Other Ambulatory Visit: Payer: Self-pay

## 2017-01-25 DIAGNOSIS — N186 End stage renal disease: Secondary | ICD-10-CM | POA: Diagnosis not present

## 2017-01-25 DIAGNOSIS — D500 Iron deficiency anemia secondary to blood loss (chronic): Secondary | ICD-10-CM | POA: Diagnosis not present

## 2017-01-25 DIAGNOSIS — E8809 Other disorders of plasma-protein metabolism, not elsewhere classified: Secondary | ICD-10-CM | POA: Diagnosis not present

## 2017-01-25 DIAGNOSIS — N2581 Secondary hyperparathyroidism of renal origin: Secondary | ICD-10-CM | POA: Diagnosis not present

## 2017-01-25 DIAGNOSIS — D631 Anemia in chronic kidney disease: Secondary | ICD-10-CM | POA: Diagnosis not present

## 2017-01-25 MED ORDER — CLONAZEPAM 0.5 MG PO TABS: 0.5000 mg | ORAL_TABLET | Freq: Two times a day (BID) | ORAL | 0 refills | Status: DC | PRN

## 2017-01-25 NOTE — Telephone Encounter (Signed)
Patient has called asking for refill of medication Clonazepam. Medication has been refilled Per Jacalyn Lefevre. 0.5 mg tablet, 2 times daily disp 60 with no additional refills. Medication has been phoned in to [facility].  Patient has been notified.

## 2017-01-27 DIAGNOSIS — N186 End stage renal disease: Secondary | ICD-10-CM | POA: Diagnosis not present

## 2017-01-27 DIAGNOSIS — D500 Iron deficiency anemia secondary to blood loss (chronic): Secondary | ICD-10-CM | POA: Diagnosis not present

## 2017-01-27 DIAGNOSIS — D631 Anemia in chronic kidney disease: Secondary | ICD-10-CM | POA: Diagnosis not present

## 2017-01-27 DIAGNOSIS — E8809 Other disorders of plasma-protein metabolism, not elsewhere classified: Secondary | ICD-10-CM | POA: Diagnosis not present

## 2017-01-27 DIAGNOSIS — N2581 Secondary hyperparathyroidism of renal origin: Secondary | ICD-10-CM | POA: Diagnosis not present

## 2017-01-28 ENCOUNTER — Other Ambulatory Visit: Payer: Self-pay | Admitting: Registered Nurse

## 2017-01-30 DIAGNOSIS — E8809 Other disorders of plasma-protein metabolism, not elsewhere classified: Secondary | ICD-10-CM | POA: Diagnosis not present

## 2017-01-30 DIAGNOSIS — D631 Anemia in chronic kidney disease: Secondary | ICD-10-CM | POA: Diagnosis not present

## 2017-01-30 DIAGNOSIS — N186 End stage renal disease: Secondary | ICD-10-CM | POA: Diagnosis not present

## 2017-01-30 DIAGNOSIS — N2581 Secondary hyperparathyroidism of renal origin: Secondary | ICD-10-CM | POA: Diagnosis not present

## 2017-01-30 DIAGNOSIS — D500 Iron deficiency anemia secondary to blood loss (chronic): Secondary | ICD-10-CM | POA: Diagnosis not present

## 2017-02-01 DIAGNOSIS — N2581 Secondary hyperparathyroidism of renal origin: Secondary | ICD-10-CM | POA: Diagnosis not present

## 2017-02-01 DIAGNOSIS — N186 End stage renal disease: Secondary | ICD-10-CM | POA: Diagnosis not present

## 2017-02-01 DIAGNOSIS — E8809 Other disorders of plasma-protein metabolism, not elsewhere classified: Secondary | ICD-10-CM | POA: Diagnosis not present

## 2017-02-01 DIAGNOSIS — D631 Anemia in chronic kidney disease: Secondary | ICD-10-CM | POA: Diagnosis not present

## 2017-02-01 DIAGNOSIS — D500 Iron deficiency anemia secondary to blood loss (chronic): Secondary | ICD-10-CM | POA: Diagnosis not present

## 2017-02-02 ENCOUNTER — Ambulatory Visit (HOSPITAL_COMMUNITY)
Admit: 2017-02-02 | Discharge: 2017-02-02 | Disposition: A | Discharge status: Home / Self Care | Payer: Medicare Other | Source: Ambulatory Visit | Attending: Vascular Surgery | Admitting: Vascular Surgery

## 2017-02-02 ENCOUNTER — Ambulatory Visit (INDEPENDENT_AMBULATORY_CARE_PROVIDER_SITE_OTHER): Payer: BLUE CROSS/BLUE SHIELD | Admitting: Vascular Surgery

## 2017-02-02 ENCOUNTER — Encounter: Payer: Self-pay | Admitting: Vascular Surgery

## 2017-02-02 VITALS — BP 127/82 | HR 101 | Temp 97.9°F | Resp 16 | Ht 66.0 in | Wt 176.0 lb

## 2017-02-02 DIAGNOSIS — Z48812 Encounter for surgical aftercare following surgery on the circulatory system: Secondary | ICD-10-CM | POA: Insufficient documentation

## 2017-02-02 DIAGNOSIS — N186 End stage renal disease: Secondary | ICD-10-CM | POA: Insufficient documentation

## 2017-02-02 NOTE — Progress Notes (Signed)
Subjective:     Patient ID: James Kim, male   DOB: [birth year], 41 y.o.   MRN: [id number]  HPI 41 year old male on dialysis via left IJ catheter placed and then we'll [place]. Recently underwent right arm brachiocephalic AV fistula which is palpable thrill as well as a field his hand feels fine. He is anxious to get to using this fistula as the catheter is having issues.   Review of Systems No complaints    Objective:   Physical Exam [id number] Well healed antecubital incision 2+ right radial pulse Strong right upper arm thrill     Dialysis duplex today demonstrates diameter of greater than 0.5 cm throughout with flow volume 1462 mL/m. Assessment/plan     41yo male follows up from recent placement right upper surgery AV fistula which has adequate flow volumes in diameter. He can begin to use this in 4 weeks unless it is absolutely needed before. The follow-up when necessary basis.    James Kim C. Randie Heinz, MD [facility] Office: [phone] Pager: [phone]

## 2017-02-03 DIAGNOSIS — E8809 Other disorders of plasma-protein metabolism, not elsewhere classified: Secondary | ICD-10-CM | POA: Diagnosis not present

## 2017-02-03 DIAGNOSIS — D500 Iron deficiency anemia secondary to blood loss (chronic): Secondary | ICD-10-CM | POA: Diagnosis not present

## 2017-02-03 DIAGNOSIS — N2581 Secondary hyperparathyroidism of renal origin: Secondary | ICD-10-CM | POA: Diagnosis not present

## 2017-02-03 DIAGNOSIS — N186 End stage renal disease: Secondary | ICD-10-CM | POA: Diagnosis not present

## 2017-02-03 DIAGNOSIS — D631 Anemia in chronic kidney disease: Secondary | ICD-10-CM | POA: Diagnosis not present

## 2017-02-04 DIAGNOSIS — Z992 Dependence on renal dialysis: Secondary | ICD-10-CM | POA: Diagnosis not present

## 2017-02-04 DIAGNOSIS — N186 End stage renal disease: Secondary | ICD-10-CM | POA: Diagnosis not present

## 2017-02-08 DIAGNOSIS — D631 Anemia in chronic kidney disease: Secondary | ICD-10-CM | POA: Diagnosis not present

## 2017-02-08 DIAGNOSIS — D500 Iron deficiency anemia secondary to blood loss (chronic): Secondary | ICD-10-CM | POA: Diagnosis not present

## 2017-02-08 DIAGNOSIS — N186 End stage renal disease: Secondary | ICD-10-CM | POA: Diagnosis not present

## 2017-02-08 DIAGNOSIS — N2581 Secondary hyperparathyroidism of renal origin: Secondary | ICD-10-CM | POA: Diagnosis not present

## 2017-02-08 DIAGNOSIS — E8809 Other disorders of plasma-protein metabolism, not elsewhere classified: Secondary | ICD-10-CM | POA: Diagnosis not present

## 2017-02-13 DIAGNOSIS — N2581 Secondary hyperparathyroidism of renal origin: Secondary | ICD-10-CM | POA: Diagnosis not present

## 2017-02-13 DIAGNOSIS — D500 Iron deficiency anemia secondary to blood loss (chronic): Secondary | ICD-10-CM | POA: Diagnosis not present

## 2017-02-13 DIAGNOSIS — D631 Anemia in chronic kidney disease: Secondary | ICD-10-CM | POA: Diagnosis not present

## 2017-02-13 DIAGNOSIS — N186 End stage renal disease: Secondary | ICD-10-CM | POA: Diagnosis not present

## 2017-02-13 DIAGNOSIS — E8809 Other disorders of plasma-protein metabolism, not elsewhere classified: Secondary | ICD-10-CM | POA: Diagnosis not present

## 2017-02-15 DIAGNOSIS — D500 Iron deficiency anemia secondary to blood loss (chronic): Secondary | ICD-10-CM | POA: Diagnosis not present

## 2017-02-15 DIAGNOSIS — N2581 Secondary hyperparathyroidism of renal origin: Secondary | ICD-10-CM | POA: Diagnosis not present

## 2017-02-15 DIAGNOSIS — E8809 Other disorders of plasma-protein metabolism, not elsewhere classified: Secondary | ICD-10-CM | POA: Diagnosis not present

## 2017-02-15 DIAGNOSIS — D631 Anemia in chronic kidney disease: Secondary | ICD-10-CM | POA: Diagnosis not present

## 2017-02-15 DIAGNOSIS — N186 End stage renal disease: Secondary | ICD-10-CM | POA: Diagnosis not present

## 2017-02-16 ENCOUNTER — Encounter: Payer: Medicare Other | Attending: Physical Medicine & Rehabilitation | Admitting: Registered Nurse

## 2017-02-16 ENCOUNTER — Encounter: Payer: Self-pay | Admitting: Registered Nurse

## 2017-02-16 VITALS — BP 113/77 | HR 100

## 2017-02-16 DIAGNOSIS — N185 Chronic kidney disease, stage 5: Secondary | ICD-10-CM | POA: Diagnosis not present

## 2017-02-16 DIAGNOSIS — G894 Chronic pain syndrome: Secondary | ICD-10-CM | POA: Diagnosis not present

## 2017-02-16 DIAGNOSIS — M722 Plantar fascial fibromatosis: Secondary | ICD-10-CM | POA: Diagnosis not present

## 2017-02-16 DIAGNOSIS — Z5181 Encounter for therapeutic drug level monitoring: Secondary | ICD-10-CM | POA: Diagnosis present

## 2017-02-16 DIAGNOSIS — M109 Gout, unspecified: Secondary | ICD-10-CM | POA: Diagnosis not present

## 2017-02-16 DIAGNOSIS — F411 Generalized anxiety disorder: Secondary | ICD-10-CM

## 2017-02-16 DIAGNOSIS — Z79899 Other long term (current) drug therapy: Secondary | ICD-10-CM | POA: Insufficient documentation

## 2017-02-16 DIAGNOSIS — M1009 Idiopathic gout, multiple sites: Secondary | ICD-10-CM | POA: Diagnosis not present

## 2017-02-16 MED ORDER — OXYCODONE-ACETAMINOPHEN 10-325 MG PO TABS: 1.0000 | ORAL_TABLET | Freq: Every day | ORAL | 0 refills | Status: DC | PRN

## 2017-02-16 MED ORDER — OXYCONTIN 30 MG PO T12A: 30.0000 mg | EXTENDED_RELEASE_TABLET | Freq: Two times a day (BID) | ORAL | 0 refills | Status: DC

## 2017-02-16 MED ORDER — CLONAZEPAM 0.5 MG PO TABS: 0.5000 mg | ORAL_TABLET | Freq: Two times a day (BID) | ORAL | 2 refills | Status: DC | PRN

## 2017-02-16 NOTE — Progress Notes (Signed)
Subjective:    Patient ID: James Kim, male    DOB: [birth year], 41 y.o.   MRN: [id number]  HPI: [name] is a 41year old male who returns for follow up appointmentfor chronic pain and medication refill. He states his pain is located in his bilateral lower extremities and bilateral feet. He rates his pain 4.His current exercise regime is walking.   Mr. Urbas Morphine equivalent is 165.00 MME. He is also prescribed Klonopin.  We have discussed the black box warning of using opioids and benzodiazepines. I highlighted the dangers of using these drugs together and discussed the adverse events including respiratory suppression, overdose, cognitive impairment and importance of  compliance with current regimen. He verbalizes understanding, we will continue to monitor and adjust as indicated.    He will be seen monthly, he verbalizes understanding.    His last Oral swab was done on 10/09/2016, it was consistent.    Pain Inventory Average Pain 6 Pain Right Now 4 My pain is sharp, stabbing and aching  In the last 24 hours, has pain interfered with the following? General activity 6 Relation with others 7 Enjoyment of life 8 What TIME of day is your pain at its worst? morning and evening, evening Sleep (in general) Poor  Pain is worse with: walking, standing and some activites Pain improves with: rest, heat/ice, medication and injections Relief from Meds: 5  Mobility walk without assistance use a walker ability to climb steps?  yes do you drive?  yes Do you have any goals in this area?  yes  Function disabled: date disabled . I need assistance with the following:  bathing and household duties  Neuro/Psych trouble walking anxiety  Prior Studies Any changes since last visit?  no  Physicians involved in your care Any changes since last visit?  no   Family History  Problem Relation Age of Onset  . Stroke Mother   . Diabetes Father    Social History     Social History  . Marital status: Married    Spouse name: N/A  . Number of children: N/A  . Years of education: N/A   Social History Main Topics  . Smoking status: Never Smoker  . Smokeless tobacco: Current User    Types: Snuff     Comment: snuff- somedays  . Alcohol use No  . Drug use: No  . Sexual activity: Yes   Other Topics Concern  . Not on file   Social History Narrative  . No narrative on file   Past Surgical History:  Procedure Laterality Date  . AV FISTULA PLACEMENT Right 12/12/2016   Procedure: ARTERIOVENOUS  FISTULA CREATION-RIGHT BRACHIOCEPHALIC;  Surgeon: Maeola Harman, MD;  Location: [facility] OR;  Service: Vascular;  Laterality: Right;  . CAPD REMOVAL N/A 08/10/2016   Procedure: CONTINUOUS AMBULATORY PERITONEAL DIALYSIS  (CAPD) CATHETER REMOVAL;  Surgeon: Claud Kelp, MD;  Location: [facility] OR;  Service: General;  Laterality: N/A;  . DIALYSIS/PERMA CATHETER INSERTION Right 08/03/2016  . INSERTION OF DIALYSIS CATHETER  ~ 2015   peritoneal dialysis  . Placement of Hemodialysis Catheter Left   . Removal of hemodialysis catheter     Past Medical History:  Diagnosis Date  . Anxiety   . Arthritis    "from my knees down" (08/08/2016)- RA  . Childhood asthma   . 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 Dialysis-associated peritonitis (HCC) 08/08/2016  . Eczema   . ESRD on peritoneal dialysis (  HCC)    "01/29/2014-08/07/2016" (08/08/2016)  . GERD (gastroesophageal reflux disease)   . Heel spur    "both feet" (08/08/2016)  . Hypertension   . Noncompliance   . OSA on CPAP   . Pain management    "for pain BLE; knees down thru feet" (08/08/2016)  . Peritoneal dialysis catheter in place [facility])   . Restless leg    BP 113/77   Pulse (!) 110   SpO2 97%   Opioid Risk Score:  1 Fall Risk Score:  `1  Depression screen PHQ 2/9  Depression screen [facility] 2/9 02/16/2017 01/19/2016 08/03/2015 01/25/2015 08/10/2014  Decreased Interest 0 1 1 0 2  Down, Depressed,  Hopeless 0 1 1 0 1  PHQ - 2 Score 0 2 2 0 3  Altered sleeping - - 2 - 3  Tired, decreased energy - - 1 - 3  Change in appetite - - 1 - 3  Feeling bad or failure about yourself  - - 0 - 1  Trouble concentrating - - 0 - 2  Moving slowly or fidgety/restless - - 0 - 0  Suicidal thoughts - - 0 - 0  PHQ-9 Score - - 6 - 15  Difficult doing work/chores - - Not difficult at all - -    Review of Systems  Constitutional: Positive for unexpected weight change.  HENT: Negative.   Eyes: Negative.   Respiratory: Positive for apnea.   Cardiovascular: Positive for leg swelling.  Gastrointestinal: Positive for nausea.  Endocrine: Negative.   Genitourinary: Positive for difficulty urinating.  Musculoskeletal: Positive for gait problem and myalgias.  Skin: Negative.   Allergic/Immunologic: Negative.   Psychiatric/Behavioral: The patient is nervous/anxious.   All other systems reviewed and are negative.      Objective:   Physical Exam  Constitutional: He is oriented to person, place, and time. He appears well-developed and well-nourished.  HENT:  Head: Normocephalic and atraumatic.  Neck: Normal range of motion. Neck supple.  Cardiovascular: Normal rate and regular rhythm.   Pulmonary/Chest: Effort normal and breath sounds normal.  Musculoskeletal:  Normal Muscle Bulk and Muscle Testing Reveals: Upper Extremities: Full ROM and Muscle Strength 5/5 Lower Extremities: Full ROM and Muscle Strength 5/5 Bilateral Lower Extremities Flexion Produces Pain into Bilateral Feet Arises from chair slowly Antalgic gait  Neurological: He is alert and oriented to person, place, and time.  Skin: Skin is warm and dry.  Psychiatric: He has a normal mood and affect.  Nursing note and vitals reviewed.         Assessment & Plan:  1.Chronic foot pain with history of plantar fasciitis/ Gout Arthropathy: 02/16/2017 Refilled:Oxycodone 10/325 mg one tablet 5 times a dayas needed #150 and Oxycontin 30mg   one capsule every 12 hours #60. Continue Lyrica. We will continue the opioid monitoring program this consists of, regular clinic visits, examinations, urine drug screen, pill counts as well as use of Controlled Substance Reporting System. 2.Chronic Kidney Disease stage V:On Hemo-Dialysis/ Nephrology Following. 02/16/2017 3. Anxiety: Continue Klonopin. 02/16/2017  20  minutes of face to face patient care time was spent during this visit. All questions were encouraged and answered.   F/U in 1 month

## 2017-02-22 DIAGNOSIS — D500 Iron deficiency anemia secondary to blood loss (chronic): Secondary | ICD-10-CM | POA: Diagnosis not present

## 2017-02-22 DIAGNOSIS — E8809 Other disorders of plasma-protein metabolism, not elsewhere classified: Secondary | ICD-10-CM | POA: Diagnosis not present

## 2017-02-22 DIAGNOSIS — N186 End stage renal disease: Secondary | ICD-10-CM | POA: Diagnosis not present

## 2017-02-22 DIAGNOSIS — N2581 Secondary hyperparathyroidism of renal origin: Secondary | ICD-10-CM | POA: Diagnosis not present

## 2017-02-22 DIAGNOSIS — D631 Anemia in chronic kidney disease: Secondary | ICD-10-CM | POA: Diagnosis not present

## 2017-02-27 DIAGNOSIS — R030 Elevated blood-pressure reading, without diagnosis of hypertension: Secondary | ICD-10-CM | POA: Diagnosis not present

## 2017-02-28 DIAGNOSIS — E875 Hyperkalemia: Secondary | ICD-10-CM | POA: Diagnosis not present

## 2017-02-28 DIAGNOSIS — R112 Nausea with vomiting, unspecified: Secondary | ICD-10-CM | POA: Diagnosis not present

## 2017-02-28 DIAGNOSIS — E8779 Other fluid overload: Secondary | ICD-10-CM | POA: Diagnosis not present

## 2017-02-28 DIAGNOSIS — N186 End stage renal disease: Secondary | ICD-10-CM | POA: Diagnosis not present

## 2017-03-01 DIAGNOSIS — E8779 Other fluid overload: Secondary | ICD-10-CM | POA: Diagnosis not present

## 2017-03-01 DIAGNOSIS — N186 End stage renal disease: Secondary | ICD-10-CM | POA: Diagnosis not present

## 2017-03-01 DIAGNOSIS — E875 Hyperkalemia: Secondary | ICD-10-CM | POA: Diagnosis not present

## 2017-03-02 DIAGNOSIS — E875 Hyperkalemia: Secondary | ICD-10-CM | POA: Diagnosis not present

## 2017-03-02 DIAGNOSIS — R4182 Altered mental status, unspecified: Secondary | ICD-10-CM | POA: Diagnosis not present

## 2017-03-02 DIAGNOSIS — R55 Syncope and collapse: Secondary | ICD-10-CM | POA: Diagnosis not present

## 2017-03-02 DIAGNOSIS — N186 End stage renal disease: Secondary | ICD-10-CM | POA: Diagnosis not present

## 2017-03-02 DIAGNOSIS — E8779 Other fluid overload: Secondary | ICD-10-CM | POA: Diagnosis not present

## 2017-03-03 DIAGNOSIS — G92 Toxic encephalopathy: Secondary | ICD-10-CM | POA: Diagnosis not present

## 2017-03-03 DIAGNOSIS — E875 Hyperkalemia: Secondary | ICD-10-CM | POA: Diagnosis not present

## 2017-03-03 DIAGNOSIS — N186 End stage renal disease: Secondary | ICD-10-CM | POA: Diagnosis not present

## 2017-03-06 DIAGNOSIS — N2581 Secondary hyperparathyroidism of renal origin: Secondary | ICD-10-CM | POA: Diagnosis not present

## 2017-03-06 DIAGNOSIS — E8809 Other disorders of plasma-protein metabolism, not elsewhere classified: Secondary | ICD-10-CM | POA: Diagnosis not present

## 2017-03-06 DIAGNOSIS — D631 Anemia in chronic kidney disease: Secondary | ICD-10-CM | POA: Diagnosis not present

## 2017-03-06 DIAGNOSIS — D500 Iron deficiency anemia secondary to blood loss (chronic): Secondary | ICD-10-CM | POA: Diagnosis not present

## 2017-03-06 DIAGNOSIS — N186 End stage renal disease: Secondary | ICD-10-CM | POA: Diagnosis not present

## 2017-03-07 DIAGNOSIS — Z992 Dependence on renal dialysis: Secondary | ICD-10-CM | POA: Diagnosis not present

## 2017-03-07 DIAGNOSIS — N186 End stage renal disease: Secondary | ICD-10-CM | POA: Diagnosis not present

## 2017-03-10 DIAGNOSIS — T829XXA Unspecified complication of cardiac and vascular prosthetic device, implant and graft, initial encounter: Secondary | ICD-10-CM | POA: Diagnosis not present

## 2017-03-10 DIAGNOSIS — M069 Rheumatoid arthritis, unspecified: Secondary | ICD-10-CM | POA: Diagnosis not present

## 2017-03-10 DIAGNOSIS — Z992 Dependence on renal dialysis: Secondary | ICD-10-CM | POA: Diagnosis not present

## 2017-03-10 DIAGNOSIS — I120 Hypertensive chronic kidney disease with stage 5 chronic kidney disease or end stage renal disease: Secondary | ICD-10-CM | POA: Diagnosis not present

## 2017-03-10 DIAGNOSIS — N186 End stage renal disease: Secondary | ICD-10-CM | POA: Diagnosis not present

## 2017-03-10 DIAGNOSIS — Z881 Allergy status to other antibiotic agents status: Secondary | ICD-10-CM | POA: Diagnosis not present

## 2017-03-10 DIAGNOSIS — E7800 Pure hypercholesterolemia, unspecified: Secondary | ICD-10-CM | POA: Diagnosis not present

## 2017-03-10 DIAGNOSIS — T82898A Other specified complication of vascular prosthetic devices, implants and grafts, initial encounter: Secondary | ICD-10-CM | POA: Diagnosis not present

## 2017-03-10 DIAGNOSIS — Z5320 Procedure and treatment not carried out because of patient's decision for unspecified reasons: Secondary | ICD-10-CM | POA: Diagnosis not present

## 2017-03-13 DIAGNOSIS — E8770 Fluid overload, unspecified: Secondary | ICD-10-CM | POA: Diagnosis not present

## 2017-03-13 DIAGNOSIS — D500 Iron deficiency anemia secondary to blood loss (chronic): Secondary | ICD-10-CM | POA: Diagnosis not present

## 2017-03-13 DIAGNOSIS — Z23 Encounter for immunization: Secondary | ICD-10-CM | POA: Diagnosis not present

## 2017-03-13 DIAGNOSIS — D631 Anemia in chronic kidney disease: Secondary | ICD-10-CM | POA: Diagnosis not present

## 2017-03-13 DIAGNOSIS — N186 End stage renal disease: Secondary | ICD-10-CM | POA: Diagnosis not present

## 2017-03-13 DIAGNOSIS — N2581 Secondary hyperparathyroidism of renal origin: Secondary | ICD-10-CM | POA: Diagnosis not present

## 2017-03-13 DIAGNOSIS — E875 Hyperkalemia: Secondary | ICD-10-CM | POA: Diagnosis not present

## 2017-03-13 DIAGNOSIS — E8809 Other disorders of plasma-protein metabolism, not elsewhere classified: Secondary | ICD-10-CM | POA: Diagnosis not present

## 2017-03-21 DIAGNOSIS — D500 Iron deficiency anemia secondary to blood loss (chronic): Secondary | ICD-10-CM | POA: Diagnosis not present

## 2017-03-21 DIAGNOSIS — E875 Hyperkalemia: Secondary | ICD-10-CM | POA: Diagnosis not present

## 2017-03-21 DIAGNOSIS — E8809 Other disorders of plasma-protein metabolism, not elsewhere classified: Secondary | ICD-10-CM | POA: Diagnosis not present

## 2017-03-21 DIAGNOSIS — N2581 Secondary hyperparathyroidism of renal origin: Secondary | ICD-10-CM | POA: Diagnosis not present

## 2017-03-21 DIAGNOSIS — N186 End stage renal disease: Secondary | ICD-10-CM | POA: Diagnosis not present

## 2017-03-24 DIAGNOSIS — N186 End stage renal disease: Secondary | ICD-10-CM | POA: Diagnosis not present

## 2017-03-24 DIAGNOSIS — D500 Iron deficiency anemia secondary to blood loss (chronic): Secondary | ICD-10-CM | POA: Diagnosis not present

## 2017-03-24 DIAGNOSIS — N2581 Secondary hyperparathyroidism of renal origin: Secondary | ICD-10-CM | POA: Diagnosis not present

## 2017-03-24 DIAGNOSIS — E8809 Other disorders of plasma-protein metabolism, not elsewhere classified: Secondary | ICD-10-CM | POA: Diagnosis not present

## 2017-03-24 DIAGNOSIS — E875 Hyperkalemia: Secondary | ICD-10-CM | POA: Diagnosis not present

## 2017-03-26 ENCOUNTER — Encounter: Payer: Self-pay | Admitting: Physical Medicine & Rehabilitation

## 2017-03-26 ENCOUNTER — Other Ambulatory Visit: Payer: Self-pay

## 2017-03-26 ENCOUNTER — Encounter: Payer: Medicare Other | Attending: Physical Medicine & Rehabilitation | Admitting: Physical Medicine & Rehabilitation

## 2017-03-26 VITALS — BP 129/82 | HR 84 | Resp 14

## 2017-03-26 DIAGNOSIS — M722 Plantar fascial fibromatosis: Secondary | ICD-10-CM

## 2017-03-26 DIAGNOSIS — Z5181 Encounter for therapeutic drug level monitoring: Secondary | ICD-10-CM | POA: Diagnosis not present

## 2017-03-26 DIAGNOSIS — M792 Neuralgia and neuritis, unspecified: Secondary | ICD-10-CM

## 2017-03-26 DIAGNOSIS — N185 Chronic kidney disease, stage 5: Secondary | ICD-10-CM | POA: Diagnosis not present

## 2017-03-26 DIAGNOSIS — F329 Major depressive disorder, single episode, unspecified: Secondary | ICD-10-CM

## 2017-03-26 DIAGNOSIS — G894 Chronic pain syndrome: Secondary | ICD-10-CM

## 2017-03-26 DIAGNOSIS — N186 End stage renal disease: Secondary | ICD-10-CM | POA: Diagnosis not present

## 2017-03-26 DIAGNOSIS — Z79899 Other long term (current) drug therapy: Secondary | ICD-10-CM

## 2017-03-26 DIAGNOSIS — M1009 Idiopathic gout, multiple sites: Secondary | ICD-10-CM | POA: Insufficient documentation

## 2017-03-26 MED ORDER — DULOXETINE HCL 20 MG PO CPEP: 20.0000 mg | ORAL_CAPSULE | Freq: Every day | ORAL | 2 refills | Status: DC

## 2017-03-26 MED ORDER — OXYCONTIN 30 MG PO T12A: 30.0000 mg | EXTENDED_RELEASE_TABLET | Freq: Two times a day (BID) | ORAL | 0 refills | Status: DC

## 2017-03-26 MED ORDER — CLONAZEPAM 1 MG PO TABS: 1.0000 mg | ORAL_TABLET | Freq: Two times a day (BID) | ORAL | 2 refills | Status: DC | PRN

## 2017-03-26 MED ORDER — OXYCODONE-ACETAMINOPHEN 10-325 MG PO TABS: 1.0000 | ORAL_TABLET | Freq: Every day | ORAL | 0 refills | Status: DC | PRN

## 2017-03-26 NOTE — Progress Notes (Signed)
Subjective:    Patient ID: James Kim, male    DOB: [birth year], 41 y.o.   MRN: [id number]  HPI   Pain Inventory Average Pain 8 Pain Right Now 4 My pain is sharp, stabbing and aching  In the last 24 hours, has pain interfered with the following? General activity 8 Relation with others 6 Enjoyment of life 8 What TIME of day is your pain at its worst? morning, evening Sleep (in general) Poor  Pain is worse with: walking, standing and some activites Pain improves with: rest, heat/ice, medication and injections Relief from Meds: 5  Mobility walk without assistance ability to climb steps?  yes do you drive?  yes Do you have any goals in this area?  yes  Function disabled: date disabled . I need assistance with the following:  bathing, household duties and shopping  Neuro/Psych weakness trouble walking depression anxiety  Prior Studies Any changes since last visit?  no  Physicians involved in your care Any changes since last visit?  no   Family History  Problem Relation Age of Onset  . Stroke Mother   . Diabetes Father    Social History   Socioeconomic History  . Marital status: Married    Spouse name: None  . Number of children: None  . Years of education: None  . Highest education level: None  Social Needs  . Financial resource strain: None  . Food insecurity - worry: None  . Food insecurity - inability: None  . Transportation needs - medical: None  . Transportation needs - non-medical: None  Occupational History  . None  Tobacco Use  . Smoking status: Never Smoker  . Smokeless tobacco: Current User    Types: Snuff  . Tobacco comment: snuff- somedays  Substance and Sexual Activity  . Alcohol use: No  . Drug use: No  . Sexual activity: Yes  Other Topics Concern  . None  Social History Narrative  . None   Past Surgical History:  Procedure Laterality Date  . ARTERIOVENOUS  FISTULA CREATION-RIGHT BRACHIOCEPHALIC Right 12/12/2016   Performed by Maeola Harman, MD at [facility] OR  . CONTINUOUS AMBULATORY PERITONEAL DIALYSIS  (CAPD) CATHETER REMOVAL N/A 08/10/2016   Performed by Claud Kelp, MD at [facility] OR  . DIALYSIS/PERMA CATHETER INSERTION Right 08/03/2016  . INSERTION OF DIALYSIS CATHETER  ~ 2015   peritoneal dialysis  . Placement of Hemodialysis Catheter Left   . Removal of hemodialysis catheter     Past Medical History:  Diagnosis Date  . Anxiety   . Arthritis    "from my knees down" (08/08/2016)- RA  . Childhood asthma   . 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 Dialysis-associated peritonitis (HCC) 08/08/2016  . Eczema   . ESRD on peritoneal dialysis (HCC)    "01/29/2014-08/07/2016" (08/08/2016)  . GERD (gastroesophageal reflux disease)   . Heel spur    "both feet" (08/08/2016)  . Hypertension   . Noncompliance   . OSA on CPAP   . Pain management    "for pain BLE; knees down thru feet" (08/08/2016)  . Peritoneal dialysis catheter in place [facility])   . Restless leg    BP 129/82 (BP Location: Left Arm, Patient Position: Sitting, Cuff Size: Normal)   Pulse 84   Resp 14   SpO2 99%   Opioid Risk Score:   Fall Risk Score:  `1  Depression screen [facility] 2/9  Depression screen [facility] 2/9 02/16/2017 01/19/2016 08/03/2015 01/25/2015 08/10/2014  Decreased Interest 0 1 1 0 2  Down, Depressed, Hopeless 0 1 1 0 1  PHQ - 2 Score 0 2 2 0 3  Altered sleeping - - 2 - 3  Tired, decreased energy - - 1 - 3  Change in appetite - - 1 - 3  Feeling bad or failure about yourself  - - 0 - 1  Trouble concentrating - - 0 - 2  Moving slowly or fidgety/restless - - 0 - 0  Suicidal thoughts - - 0 - 0  PHQ-9 Score - - 6 - 15  Difficult doing work/chores - - Not difficult at all - -    Review of Systems  Constitutional: Positive for appetite change.  HENT: Negative.   Eyes: Negative.   Respiratory: Negative.   Cardiovascular: Negative.   Gastrointestinal: Negative.   Endocrine: Negative.   Genitourinary: Positive for  difficulty urinating.  Musculoskeletal: Positive for gait problem.  Skin: Negative.   Allergic/Immunologic: Negative.   Neurological: Positive for weakness.  Hematological: Negative.   Psychiatric/Behavioral: Positive for dysphoric mood. The patient is nervous/anxious.        Objective:   Physical Exam  Constitutional: He is oriented to person, place, and time. He appears well-developedand well-nourished. Has lost weight HENT:  Head: Normocephalicand atraumatic.  Neck: Normal range of motion. Neck supple.  Cardiovascular: Regular rate Pulmonary/Chest:  Normal effort.  Musculoskeletal:  Normal Muscle Bulk and Muscle Testing Reveals: Decreased grip due to pain bilaterally.  Ongoing antalgia bilaterally with gait.  Bilateral Lower Extremities Flexion Produces Pain into Bilateral Lower Extremities and Bilateral feet  ongoing tenderness at the origination of the plantar fascia near the anterior calcaneus.  neurological: alert and oriented. Motor and sensory stable. Cognitively he's appropriate Skin: Skin is warmand dry.  Fistula in place right upper extremity Psychiatric:  Appears much more flat and depressed today.  .       Assessment & Plan:  1.Chronic foot pain with history of plantar fasciitis/ Gout Arthropathy: 07/10/2016 Refilled:Oxycodone 10/325 mg one tablet 5x daily as needed #120. Increased Oxycontin to 30mg  one capsule every 12 hours #60. We discussed the fact that we need to really contain any increases in his schedule given his age and the chronicity of the problem. We also reviewed that he will need to take his med a little differently based on his HD schedule.  -Continue Lyrica prn -We will continue the opioid monitoring program, this consists of regular clinic visits, examinations, urine drug screen, pill counts as well as use of Controlled Substance Reporting System. NCCSRS was reviewed today.  -After informed consent and preparation of  the skin with betadine and isopropyl alcohol, I injected 6mg  (1cc) of celestone and 3cc of 1% lidocaine around insertion of the plantar fascia at calcaneus via posterior approach approach. Additionally, aspiration was performed prior to injection. The patient tolerated well, and no complications were encountered. Afterward the area was cleaned and dressed. Post- injection instructions were provided.    2.ESRD: AV fistula recently placed 3. Anxiety and reactive depression: I increase his Klonopin to 1 mg twice daily as needed.  Also will stop Lexapro may begin trial of Cymbalta 20 mg daily.  Max renal dose would be 30 mg for him.  May need further adjustments or additions moving forward.  Recommend psychological counseling.  He is already seeking psychiatric care.  I asked him to call me if he struggles with any of the medication changes or feel that his depression is worsening.  Patient agreed.   15 minutes of face to face patient care time was spent during this visit. All questions were encouraged and answered.  He will follow-up with our nurse practitioner next month

## 2017-03-26 NOTE — Patient Instructions (Signed)
PLEASE FEEL FREE TO CALL OUR OFFICE WITH ANY PROBLEMS OR QUESTIONS ([phone])      

## 2017-03-28 DIAGNOSIS — N2581 Secondary hyperparathyroidism of renal origin: Secondary | ICD-10-CM | POA: Diagnosis not present

## 2017-03-28 DIAGNOSIS — D500 Iron deficiency anemia secondary to blood loss (chronic): Secondary | ICD-10-CM | POA: Diagnosis not present

## 2017-03-28 DIAGNOSIS — E875 Hyperkalemia: Secondary | ICD-10-CM | POA: Diagnosis not present

## 2017-03-28 DIAGNOSIS — E8809 Other disorders of plasma-protein metabolism, not elsewhere classified: Secondary | ICD-10-CM | POA: Diagnosis not present

## 2017-03-28 DIAGNOSIS — N186 End stage renal disease: Secondary | ICD-10-CM | POA: Diagnosis not present

## 2017-03-30 DIAGNOSIS — N2581 Secondary hyperparathyroidism of renal origin: Secondary | ICD-10-CM | POA: Diagnosis not present

## 2017-03-30 DIAGNOSIS — E8809 Other disorders of plasma-protein metabolism, not elsewhere classified: Secondary | ICD-10-CM | POA: Diagnosis not present

## 2017-03-30 DIAGNOSIS — D500 Iron deficiency anemia secondary to blood loss (chronic): Secondary | ICD-10-CM | POA: Diagnosis not present

## 2017-03-30 DIAGNOSIS — E875 Hyperkalemia: Secondary | ICD-10-CM | POA: Diagnosis not present

## 2017-03-30 DIAGNOSIS — N186 End stage renal disease: Secondary | ICD-10-CM | POA: Diagnosis not present

## 2017-03-30 LAB — DRUG TOX MONITOR 1 W/CONF, ORAL FLD
ALPRAZOLAM: NEGATIVE ng/mL (ref <0.50)
AMPHETAMINES: NEGATIVE ng/mL (ref <10)
BARBITURATES: NEGATIVE ng/mL (ref <10)
BENZODIAZEPINES: POSITIVE ng/mL — AB (ref <0.50)
Buprenorphine: NEGATIVE ng/mL (ref <0.10)
CHLORDIAZEPOXIDE: NEGATIVE ng/mL (ref <0.50)
Clonazepam: 1.12 ng/mL — ABNORMAL HIGH (ref <0.50)
Cocaine: NEGATIVE ng/mL (ref <5.0)
Codeine: NEGATIVE ng/mL (ref <2.5)
Cotinine: >250 ng/mL — ABNORMAL HIGH (ref <5.0)
DIAZEPAM: NEGATIVE ng/mL (ref <0.50)
Dihydrocodeine: NEGATIVE ng/mL (ref <2.5)
FLUNITRAZEPAM: NEGATIVE ng/mL (ref <0.50)
Fentanyl: NEGATIVE ng/mL (ref <0.10)
Flurazepam: NEGATIVE ng/mL (ref <0.50)
HEROIN METABOLITE: NEGATIVE ng/mL (ref <1.0)
HYDROCODONE: NEGATIVE ng/mL (ref <2.5)
HYDROMORPHONE: NEGATIVE ng/mL (ref <2.5)
LORAZEPAM: NEGATIVE ng/mL (ref <0.50)
MARIJUANA: NEGATIVE ng/mL (ref <2.5)
MDMA: NEGATIVE ng/mL (ref <10)
MEPROBAMATE: NEGATIVE ng/mL (ref <2.5)
METHADONE: NEGATIVE ng/mL (ref <5.0)
MIDAZOLAM: NEGATIVE ng/mL (ref <0.50)
Morphine: NEGATIVE ng/mL (ref <2.5)
NORHYDROCODONE: NEGATIVE ng/mL (ref <2.5)
NOROXYCODONE: 14 ng/mL — AB (ref <2.5)
Nicotine Metabolite: POSITIVE ng/mL — AB (ref <5.0)
Nordiazepam: NEGATIVE ng/mL (ref <0.50)
OXAZEPAM: NEGATIVE ng/mL (ref <0.50)
Opiates: POSITIVE ng/mL — AB (ref <2.5)
Oxycodone: >250 ng/mL — ABNORMAL HIGH (ref <2.5)
Oxymorphone: NEGATIVE ng/mL (ref <2.5)
PHENCYCLIDINE: NEGATIVE ng/mL (ref <10)
Tapentadol: NEGATIVE ng/mL (ref <5.0)
Temazepam: NEGATIVE ng/mL (ref <0.50)
Tramadol: NEGATIVE ng/mL (ref <5.0)
Triazolam: NEGATIVE ng/mL (ref <0.50)
ZOLPIDEM: NEGATIVE ng/mL (ref <5.0)

## 2017-03-30 LAB — DRUG TOX ALC METAB W/CON, ORAL FLD: Alcohol Metabolite: NEGATIVE ng/mL (ref <25)

## 2017-03-31 DIAGNOSIS — E875 Hyperkalemia: Secondary | ICD-10-CM | POA: Diagnosis not present

## 2017-03-31 DIAGNOSIS — N186 End stage renal disease: Secondary | ICD-10-CM | POA: Diagnosis not present

## 2017-03-31 DIAGNOSIS — D500 Iron deficiency anemia secondary to blood loss (chronic): Secondary | ICD-10-CM | POA: Diagnosis not present

## 2017-03-31 DIAGNOSIS — E8809 Other disorders of plasma-protein metabolism, not elsewhere classified: Secondary | ICD-10-CM | POA: Diagnosis not present

## 2017-03-31 DIAGNOSIS — N2581 Secondary hyperparathyroidism of renal origin: Secondary | ICD-10-CM | POA: Diagnosis not present

## 2017-04-03 ENCOUNTER — Telehealth: Payer: Self-pay | Admitting: *Deleted

## 2017-04-03 DIAGNOSIS — N186 End stage renal disease: Secondary | ICD-10-CM | POA: Diagnosis not present

## 2017-04-03 DIAGNOSIS — E875 Hyperkalemia: Secondary | ICD-10-CM | POA: Diagnosis not present

## 2017-04-03 DIAGNOSIS — E8809 Other disorders of plasma-protein metabolism, not elsewhere classified: Secondary | ICD-10-CM | POA: Diagnosis not present

## 2017-04-03 DIAGNOSIS — D500 Iron deficiency anemia secondary to blood loss (chronic): Secondary | ICD-10-CM | POA: Diagnosis not present

## 2017-04-03 DIAGNOSIS — N2581 Secondary hyperparathyroidism of renal origin: Secondary | ICD-10-CM | POA: Diagnosis not present

## 2017-04-03 NOTE — Telephone Encounter (Signed)
Oral swab drug screen was consistent for prescribed medications.  ?

## 2017-04-06 DIAGNOSIS — Z992 Dependence on renal dialysis: Secondary | ICD-10-CM | POA: Diagnosis not present

## 2017-04-06 DIAGNOSIS — N186 End stage renal disease: Secondary | ICD-10-CM | POA: Diagnosis not present

## 2017-04-10 DIAGNOSIS — N2581 Secondary hyperparathyroidism of renal origin: Secondary | ICD-10-CM | POA: Diagnosis not present

## 2017-04-10 DIAGNOSIS — D500 Iron deficiency anemia secondary to blood loss (chronic): Secondary | ICD-10-CM | POA: Diagnosis not present

## 2017-04-10 DIAGNOSIS — N186 End stage renal disease: Secondary | ICD-10-CM | POA: Diagnosis not present

## 2017-04-10 DIAGNOSIS — D631 Anemia in chronic kidney disease: Secondary | ICD-10-CM | POA: Diagnosis not present

## 2017-04-13 DIAGNOSIS — T82898A Other specified complication of vascular prosthetic devices, implants and grafts, initial encounter: Secondary | ICD-10-CM | POA: Diagnosis not present

## 2017-04-13 DIAGNOSIS — T82858A Stenosis of vascular prosthetic devices, implants and grafts, initial encounter: Secondary | ICD-10-CM | POA: Diagnosis not present

## 2017-04-17 DIAGNOSIS — N2581 Secondary hyperparathyroidism of renal origin: Secondary | ICD-10-CM | POA: Diagnosis not present

## 2017-04-17 DIAGNOSIS — D631 Anemia in chronic kidney disease: Secondary | ICD-10-CM | POA: Diagnosis not present

## 2017-04-17 DIAGNOSIS — D500 Iron deficiency anemia secondary to blood loss (chronic): Secondary | ICD-10-CM | POA: Diagnosis not present

## 2017-04-17 DIAGNOSIS — N186 End stage renal disease: Secondary | ICD-10-CM | POA: Diagnosis not present

## 2017-04-22 DIAGNOSIS — R531 Weakness: Secondary | ICD-10-CM | POA: Diagnosis not present

## 2017-04-22 DIAGNOSIS — N186 End stage renal disease: Secondary | ICD-10-CM | POA: Diagnosis not present

## 2017-04-22 DIAGNOSIS — E162 Hypoglycemia, unspecified: Secondary | ICD-10-CM | POA: Diagnosis not present

## 2017-04-22 DIAGNOSIS — R404 Transient alteration of awareness: Secondary | ICD-10-CM | POA: Diagnosis not present

## 2017-04-22 DIAGNOSIS — E875 Hyperkalemia: Secondary | ICD-10-CM | POA: Diagnosis not present

## 2017-04-23 ENCOUNTER — Encounter (HOSPITAL_COMMUNITY): Payer: Self-pay | Admitting: Neurology

## 2017-04-23 ENCOUNTER — Emergency Department (HOSPITAL_COMMUNITY): Payer: Medicare Other

## 2017-04-23 ENCOUNTER — Other Ambulatory Visit: Payer: Self-pay

## 2017-04-23 ENCOUNTER — Inpatient Hospital Stay (HOSPITAL_COMMUNITY)
Admission: EM | Admit: 2017-04-23 | Discharge: 2017-04-26 | DRG: 640 | Disposition: A | Discharge status: Home / Self Care | Payer: Medicare Other | Attending: Internal Medicine | Admitting: Internal Medicine

## 2017-04-23 DIAGNOSIS — N186 End stage renal disease: Secondary | ICD-10-CM | POA: Diagnosis not present

## 2017-04-23 DIAGNOSIS — I1311 Hypertensive heart and chronic kidney disease without heart failure, with stage 5 chronic kidney disease, or end stage renal disease: Secondary | ICD-10-CM | POA: Diagnosis present

## 2017-04-23 DIAGNOSIS — M069 Rheumatoid arthritis, unspecified: Secondary | ICD-10-CM | POA: Diagnosis present

## 2017-04-23 DIAGNOSIS — Z9115 Patient's noncompliance with renal dialysis: Secondary | ICD-10-CM

## 2017-04-23 DIAGNOSIS — Y95 Nosocomial condition: Secondary | ICD-10-CM | POA: Diagnosis present

## 2017-04-23 DIAGNOSIS — J189 Pneumonia, unspecified organism: Secondary | ICD-10-CM | POA: Diagnosis not present

## 2017-04-23 DIAGNOSIS — Z79891 Long term (current) use of opiate analgesic: Secondary | ICD-10-CM

## 2017-04-23 DIAGNOSIS — Z91199 Patient's noncompliance with other medical treatment and regimen due to unspecified reason: Secondary | ICD-10-CM

## 2017-04-23 DIAGNOSIS — N2581 Secondary hyperparathyroidism of renal origin: Secondary | ICD-10-CM | POA: Diagnosis not present

## 2017-04-23 DIAGNOSIS — Z882 Allergy status to sulfonamides status: Secondary | ICD-10-CM

## 2017-04-23 DIAGNOSIS — R509 Fever, unspecified: Secondary | ICD-10-CM

## 2017-04-23 DIAGNOSIS — I10 Essential (primary) hypertension: Secondary | ICD-10-CM | POA: Diagnosis present

## 2017-04-23 DIAGNOSIS — E8779 Other fluid overload: Secondary | ICD-10-CM | POA: Diagnosis not present

## 2017-04-23 DIAGNOSIS — D631 Anemia in chronic kidney disease: Secondary | ICD-10-CM | POA: Diagnosis not present

## 2017-04-23 DIAGNOSIS — J811 Chronic pulmonary edema: Secondary | ICD-10-CM

## 2017-04-23 DIAGNOSIS — F17290 Nicotine dependence, other tobacco product, uncomplicated: Secondary | ICD-10-CM | POA: Diagnosis present

## 2017-04-23 DIAGNOSIS — J810 Acute pulmonary edema: Secondary | ICD-10-CM | POA: Diagnosis not present

## 2017-04-23 DIAGNOSIS — Z823 Family history of stroke: Secondary | ICD-10-CM

## 2017-04-23 DIAGNOSIS — E875 Hyperkalemia: Secondary | ICD-10-CM | POA: Diagnosis not present

## 2017-04-23 DIAGNOSIS — G2581 Restless legs syndrome: Secondary | ICD-10-CM | POA: Diagnosis present

## 2017-04-23 DIAGNOSIS — G8929 Other chronic pain: Secondary | ICD-10-CM | POA: Diagnosis present

## 2017-04-23 DIAGNOSIS — Z9119 Patient's noncompliance with other medical treatment and regimen: Secondary | ICD-10-CM

## 2017-04-23 DIAGNOSIS — Z833 Family history of diabetes mellitus: Secondary | ICD-10-CM

## 2017-04-23 DIAGNOSIS — I120 Hypertensive chronic kidney disease with stage 5 chronic kidney disease or end stage renal disease: Secondary | ICD-10-CM | POA: Diagnosis not present

## 2017-04-23 DIAGNOSIS — E8770 Fluid overload, unspecified: Secondary | ICD-10-CM | POA: Diagnosis not present

## 2017-04-23 DIAGNOSIS — Z992 Dependence on renal dialysis: Secondary | ICD-10-CM

## 2017-04-23 DIAGNOSIS — D638 Anemia in other chronic diseases classified elsewhere: Secondary | ICD-10-CM | POA: Diagnosis present

## 2017-04-23 LAB — CBC
HCT: 27.4 % — ABNORMAL LOW (ref 39.0–52.0)
HEMOGLOBIN: 8.9 g/dL — AB (ref 13.0–17.0)
MCH: 29.5 pg (ref 26.0–34.0)
MCHC: 32.5 g/dL (ref 30.0–36.0)
MCV: 90.7 fL (ref 78.0–100.0)
PLATELETS: 127 10*3/uL — AB (ref 150–400)
RBC: 3.02 MIL/uL — AB (ref 4.22–5.81)
RDW: 16.2 % — ABNORMAL HIGH (ref 11.5–15.5)
WBC: 8.4 10*3/uL (ref 4.0–10.5)

## 2017-04-23 LAB — BASIC METABOLIC PANEL
ANION GAP: 17 — AB (ref 5–15)
BUN: 86 mg/dL — ABNORMAL HIGH (ref 6–20)
CALCIUM: 7.5 mg/dL — AB (ref 8.9–10.3)
CHLORIDE: 103 mmol/L (ref 101–111)
CO2: 17 mmol/L — AB (ref 22–32)
CREATININE: 15.84 mg/dL — AB (ref 0.61–1.24)
GFR calc non Af Amer: 3 mL/min — ABNORMAL LOW (ref >60)
GFR, EST AFRICAN AMERICAN: 4 mL/min — AB (ref >60)
Glucose, Bld: 122 mg/dL — ABNORMAL HIGH (ref 65–99)
Potassium: 5.7 mmol/L — ABNORMAL HIGH (ref 3.5–5.1)
SODIUM: 137 mmol/L (ref 135–145)

## 2017-04-23 LAB — I-STAT CHEM 8, ED
BUN: 86 mg/dL — AB (ref 6–20)
CHLORIDE: 106 mmol/L (ref 101–111)
Calcium, Ion: 0.95 mmol/L — ABNORMAL LOW (ref 1.15–1.40)
Creatinine, Ser: 16.9 mg/dL — ABNORMAL HIGH (ref 0.61–1.24)
Glucose, Bld: 118 mg/dL — ABNORMAL HIGH (ref 65–99)
HEMATOCRIT: 26 % — AB (ref 39.0–52.0)
Hemoglobin: 8.8 g/dL — ABNORMAL LOW (ref 13.0–17.0)
Potassium: 5.5 mmol/L — ABNORMAL HIGH (ref 3.5–5.1)
SODIUM: 138 mmol/L (ref 135–145)
TCO2: 16 mmol/L — ABNORMAL LOW (ref 22–32)

## 2017-04-23 LAB — BRAIN NATRIURETIC PEPTIDE: B NATRIURETIC PEPTIDE 5: 836.9 pg/mL — AB (ref 0.0–100.0)

## 2017-04-23 MED ORDER — SODIUM CHLORIDE 0.9% FLUSH: 3.0000 mL | INTRAVENOUS | Status: DC | PRN

## 2017-04-23 MED ORDER — SODIUM CHLORIDE 0.9% FLUSH
3.0000 mL | Freq: Two times a day (BID) | INTRAVENOUS | Status: DC
  Administered 2017-04-25: 3 mL via INTRAVENOUS

## 2017-04-23 MED ORDER — OXYCODONE-ACETAMINOPHEN 5-325 MG PO TABS
2.0000 | ORAL_TABLET | Freq: Once | ORAL | Status: AC
  Administered 2017-04-23: 2 via ORAL
  Filled 2017-04-23: qty 2

## 2017-04-23 MED ORDER — OXYCODONE-ACETAMINOPHEN 10-325 MG PO TABS: 1.0000 | ORAL_TABLET | Freq: Every day | ORAL | Status: DC | PRN

## 2017-04-23 MED ORDER — OXYCODONE-ACETAMINOPHEN 5-325 MG PO TABS
1.0000 | ORAL_TABLET | Freq: Every day | ORAL | Status: DC | PRN
  Administered 2017-04-24 – 2017-04-26 (×7): 1 via ORAL
  Filled 2017-04-23 (×8): qty 1

## 2017-04-23 MED ORDER — PREGABALIN 25 MG PO CAPS
25.0000 mg | ORAL_CAPSULE | Freq: Every day | ORAL | Status: DC | PRN
  Administered 2017-04-24 – 2017-04-25 (×2): 25 mg via ORAL
  Filled 2017-04-23 (×2): qty 1

## 2017-04-23 MED ORDER — SODIUM CHLORIDE 0.9 % IV SOLN: 250.0000 mL | INTRAVENOUS | Status: DC | PRN

## 2017-04-23 MED ORDER — OXYCODONE HCL 5 MG PO TABS
5.0000 mg | ORAL_TABLET | Freq: Every day | ORAL | Status: DC | PRN
  Administered 2017-04-24 – 2017-04-26 (×9): 5 mg via ORAL
  Filled 2017-04-23 (×8): qty 1

## 2017-04-23 MED ORDER — AMLODIPINE BESYLATE 10 MG PO TABS
10.0000 mg | ORAL_TABLET | Freq: Every day | ORAL | Status: DC
  Administered 2017-04-23 – 2017-04-25 (×3): 10 mg via ORAL
  Filled 2017-04-23 (×3): qty 1

## 2017-04-23 NOTE — H&P (Signed)
History and Physical    James Kim:[id number] DOB: [birth year] DOA: 04/23/2017  PCP: Beatriz Stallion, MD  Patient coming from:  home  Chief Complaint:  sob  HPI: James Kim is a 41 y.o. male with medical history significant of ESRD t/r/sat comes in after not having dialysis for a week due to transportation issues with is dialysis unit.  Pt reports he is in the process of being set up at a closer dialysis place for him.  He cannot make it to his current one because it is too far away from where he lives.  He denies any chest pain or fevers.  Found to have pulm edema but oxygen sats normal.  nephro was called who advised pt can wait for dialysis til the am and requested medicine to obs overnight.  Review of Systems: As per HPI otherwise 10 point review of systems negative.   Past Medical History:  Diagnosis Date  . Anxiety   . Arthritis    "from my knees down" (08/08/2016)- RA  . Childhood asthma   . 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 Dialysis-associated peritonitis (HCC) 08/08/2016  . Eczema   . ESRD on peritoneal dialysis (HCC)    "01/29/2014-08/07/2016" (08/08/2016)  . GERD (gastroesophageal reflux disease)   . Heel spur    "both feet" (08/08/2016)  . Hypertension   . Noncompliance   . OSA on CPAP   . Pain management    "for pain BLE; knees down thru feet" (08/08/2016)  . Peritoneal dialysis catheter in place [facility])   . Restless leg     Past Surgical History:  Procedure Laterality Date  . AV FISTULA PLACEMENT Right 12/12/2016   Procedure: ARTERIOVENOUS  FISTULA CREATION-RIGHT BRACHIOCEPHALIC;  Surgeon: Maeola Harman, MD;  Location: [facility] OR;  Service: Vascular;  Laterality: Right;  . CAPD REMOVAL N/A 08/10/2016   Procedure: CONTINUOUS AMBULATORY PERITONEAL DIALYSIS  (CAPD) CATHETER REMOVAL;  Surgeon: Claud Kelp, MD;  Location: [facility] OR;  Service: General;  Laterality: N/A;  . DIALYSIS/PERMA CATHETER INSERTION Right 08/03/2016  . INSERTION  OF DIALYSIS CATHETER  ~ 2015   peritoneal dialysis  . Placement of Hemodialysis Catheter Left   . Removal of hemodialysis catheter       reports that  has never smoked. His smokeless tobacco use includes snuff. He reports that he does not drink alcohol or use drugs.  Allergies  Allergen Reactions  . Sulfa Antibiotics Anaphylaxis, Itching and Rash    Family History  Problem Relation Age of Onset  . Stroke Mother   . Diabetes Father     Prior to Admission medications   Medication Sig Start Date End Date Taking? Authorizing Provider  allopurinol (ZYLOPRIM) 100 MG tablet Take 1 tablet (100 mg total) by mouth every other day. Patient taking differently: Take 100 mg by mouth at bedtime.  11/19/16  Yes Emokpae, Ejiroghene E, MD  amLODipine (NORVASC) 10 MG tablet Take 1 tablet (10 mg total) by mouth every other day. Patient taking differently: Take 10 mg by mouth at bedtime.  10/06/16  Yes Rama, Maryruth Bun, MD  clonazePAM (KLONOPIN) 1 MG tablet Take 1 tablet (1 mg total) 2 (two) times daily as needed by mouth for anxiety. 03/26/17  Yes Ranelle Oyster, MD  furosemide (LASIX) 20 MG tablet Take 2 tablets (40 mg total) by mouth 2 (two) times daily. Patient taking differently: Take 40 mg by mouth every other day.  10/06/16  Yes Rama,  Maryruth Bun, MD  gentamicin (GARAMYCIN) 1.6-0.9 MG/ML-% Inject 50 mLs (80 mg total) into the vein Every Tuesday,Thursday,and Saturday with dialysis. 11/21/16  Yes Emokpae, Ejiroghene E, MD  metoprolol tartrate (LOPRESSOR) 100 MG tablet Take 100 mg by mouth at bedtime.  11/07/16  Yes [provider]  oxyCODONE-acetaminophen (PERCOCET) 10-325 MG tablet Take 1 tablet 5 (five) times daily as needed by mouth. For pain. 03/26/17  Yes Ranelle Oyster, MD  OXYCONTIN 30 MG 12 hr tablet Take 1 tablet (30 mg total) every 12 (twelve) hours by mouth. 03/26/17  Yes Ranelle Oyster, MD  pantoprazole (PROTONIX) 40 MG tablet Take 40 mg by mouth at bedtime.    Yes [provider]  pregabalin (LYRICA) 25 MG capsule Take 1 capsule (25 mg total) by mouth daily. Patient taking differently: Take 25 mg by mouth daily as needed (for nerve pain).  11/18/16  Yes Emokpae, Ejiroghene E, MD  promethazine (PHENERGAN) 12.5 MG tablet Take 12.5 mg by mouth at bedtime as needed for nausea or vomiting.  10/03/16  Yes [provider]  sevelamer carbonate (RENVELA) 800 MG tablet Take 2 tablets (1,600 mg total) by mouth 3 (three) times daily with meals. 08/11/16  Yes Short, Thea Silversmith, MD  Vitamin D, Ergocalciferol, (DRISDOL) 50000 UNITS CAPS capsule Take 50,000 Units by mouth every Wednesday.    Yes [provider]    Physical Exam: Vitals:   04/23/17 1915 04/23/17 2024 04/23/17 2030 04/23/17 2045  BP: (!) 167/99 (!) 164/104 (!) 160/92 (!) 159/96  Pulse: 92 98 88 87  Resp: (!) 21 (!) 25 (!) 24 20  Temp:      TempSrc:      SpO2: 97% 98% 99% 100%  Weight:      Height:          Constitutional: NAD, calm, comfortable Vitals:   04/23/17 1915 04/23/17 2024 04/23/17 2030 04/23/17 2045  BP: (!) 167/99 (!) 164/104 (!) 160/92 (!) 159/96  Pulse: 92 98 88 87  Resp: (!) 21 (!) 25 (!) 24 20  Temp:      TempSrc:      SpO2: 97% 98% 99% 100%  Weight:      Height:       Eyes: PERRL, lids and conjunctivae normal ENMT: Mucous membranes are moist. Posterior pharynx clear of any exudate or lesions.Normal dentition.  Neck: normal, supple, no masses, no thyromegaly Respiratory: clear to auscultation bilaterally, no wheezing, no crackles. Normal respiratory effort. No accessory muscle use.  Cardiovascular: Regular rate and rhythm, no murmurs / rubs / gallops. No extremity edema. 2+ pedal pulses. No carotid bruits.  Abdomen: no tenderness, no masses palpated. No hepatosplenomegaly. Bowel sounds positive.  Musculoskeletal: no clubbing / cyanosis. No joint deformity upper and lower extremities. Good ROM, no contractures. Normal muscle tone.  Skin: no rashes, lesions,  ulcers. No induration Neurologic: CN 2-12 grossly intact. Sensation intact, DTR normal. Strength 5/5 in all 4.  Psychiatric: Normal judgment and insight. Alert and oriented x 3. Normal mood.    Labs on Admission: I have personally reviewed following labs and imaging studies  CBC: Recent Labs  Lab 04/23/17 1430 04/23/17 1515  WBC  --  8.4  HGB 8.8* 8.9*  HCT 26.0* 27.4*  MCV  --  90.7  PLT  --  127*   Basic Metabolic Panel: Recent Labs  Lab 04/23/17 1430 04/23/17 1809  NA 138 137  K 5.5* 5.7*  CL 106 103  CO2  --  17*  GLUCOSE 118* 122*  BUN 86* 86*  CREATININE 16.90* 15.84*  CALCIUM  --  7.5*   GFR: Estimated Creatinine Clearance: 6.9 mL/min (A) (by C-G formula based on SCr of 15.84 mg/dL (H)). Liver Function Tests: No results for input(s): AST, ALT, ALKPHOS, BILITOT, PROT, ALBUMIN in the last 168 hours. No results for input(s): LIPASE, AMYLASE in the last 168 hours. No results for input(s): AMMONIA in the last 168 hours. Coagulation Profile: No results for input(s): INR, PROTIME in the last 168 hours. Cardiac Enzymes: No results for input(s): CKTOTAL, CKMB, CKMBINDEX, TROPONINI in the last 168 hours. BNP (last 3 results) No results for input(s): PROBNP in the last 8760 hours. HbA1C: No results for input(s): HGBA1C in the last 72 hours. CBG: No results for input(s): GLUCAP in the last 168 hours. Lipid Profile: No results for input(s): CHOL, HDL, LDLCALC, TRIG, CHOLHDL, LDLDIRECT in the last 72 hours. Thyroid Function Tests: No results for input(s): TSH, T4TOTAL, FREET4, T3FREE, THYROIDAB in the last 72 hours. Anemia Panel: No results for input(s): VITAMINB12, FOLATE, FERRITIN, TIBC, IRON, RETICCTPCT in the last 72 hours. Urine analysis:    Component Value Date/Time   COLORURINE YELLOW 11/15/2016 1452   APPEARANCEUR CLEAR 11/15/2016 1452   LABSPEC 1.011 11/15/2016 1452   PHURINE 7.0 11/15/2016 1452   GLUCOSEU 50 (A) 11/15/2016 1452   HGBUR SMALL (A)  11/15/2016 1452   BILIRUBINUR NEGATIVE 11/15/2016 1452   KETONESUR NEGATIVE 11/15/2016 1452   PROTEINUR >=300 (A) 11/15/2016 1452   NITRITE NEGATIVE 11/15/2016 1452   LEUKOCYTESUR NEGATIVE 11/15/2016 1452   Sepsis Labs: !!!!!!!!!!!!!!!!!!!!!!!!!!!!!!!!!!!!!!!!!!!! @LABRCNTIP (procalcitonin:4,lacticidven:4) )No results found for this or any previous visit (from the past 240 hour(s)).   Radiological Exams on Admission: Dg Chest 2 View  Result Date: 04/23/2017 CLINICAL DATA:  Needs dialysis.  Shortness of breath EXAM: CHEST  2 VIEW COMPARISON:  11/15/2016 FINDINGS: Dialysis catheter on the left with tip at the upper cavoatrial junction. Chronic cardiomegaly. Diffuse vascular congestion with interstitial coarsening. No effusion or pneumothorax. No air bronchogram. IMPRESSION: Pulmonary edema and chronic cardiomegaly. Electronically Signed   By: 01/16/2017 M.D.   On: 04/23/2017 14:40    Assessment/Plan 41 yo dialysis pt with pulm edema, sob, and normal vitals  Principal Problem:   Dialysis patient (HCC)- obs for session in the am.  Call nephro if worsens overnight  Active Problems:   Essential hypertension- cont home meds   Noncompliance- sounds like this is being worked out already, suppose to be changing dialysis center this week   Anemia of chronic disease- stable   Acute pulmonary edema (HCC)- noted    DVT prophylaxis:  scds Code Status:  full Family Communication:  none Disposition Plan:  Per day team Consults called:  nephro Admission status:  observation   Jonhatan Hearty A MD [facility]  If 7PM-7AM, please contact night-coverage [email or web address] Password [facility]  04/23/2017, 9:43 PM

## 2017-04-23 NOTE — ED Notes (Signed)
Main lab called, stated that lav tube was spun, before CBC was run. Need new lav tube collected.

## 2017-04-23 NOTE — ED Provider Notes (Signed)
[facility] EMERGENCY DEPARTMENT Provider Note   CSN: [id number] Arrival date & time: 04/23/17  1354     History   Chief Complaint Chief Complaint  Patient presents with  . Vascular Access Problem    HPI James Kim is a 41 y.o. male.  Patient with history of end-stage renal disease on hemodialysis, care in [place], [place] presents to the emergency department with shortness of breath.  Patient typically dialyzes on Tuesday/Thursday/Saturday.  Patient missed sessions and last dialyzed 6 days ago.  He states that he went to [place] last night where he was admitted to the hospital.  He had a bad experience in the hospital and was not dialyzed.  He reportedly left AGAINST MEDICAL ADVICE and came directly to my discount.  He states that he has not had his chronic pain medications for the past 2 days.  He denies any recent symptoms of illness including fevers, nausea, vomiting, or diarrhea.  Patient makes a small amount of urine.  He dialyzes currently through a port that is in the left upper chest.  Reports lower leg swelling. The onset of this condition was acute. The course is worsening. Aggravating factors: none. Alleviating factors: none.        Past Medical History:  Diagnosis Date  . Anxiety   . Arthritis    "from my knees down" (08/08/2016)- RA  . Childhood asthma   . 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 Dialysis-associated peritonitis (HCC) 08/08/2016  . Eczema   . ESRD on peritoneal dialysis (HCC)    "01/29/2014-08/07/2016" (08/08/2016)  . GERD (gastroesophageal reflux disease)   . Heel spur    "both feet" (08/08/2016)  . Hypertension   . Noncompliance   . OSA on CPAP   . Pain management    "for pain BLE; knees down thru feet" (08/08/2016)  . Peritoneal dialysis catheter in place [facility])   . Restless leg     Patient Active Problem List   Diagnosis Date Noted  . Reactive depression 03/26/2017  . Opioid intoxication (HCC)  11/19/2016  . Metabolic acidosis with respiratory acidosis 11/15/2016  . Altered mental status 11/15/2016  . ESRD (end stage renal disease) (HCC) 10/06/2016  . Noncompliance 10/06/2016  . Anemia of chronic disease 10/06/2016  . Volume overload 10/06/2016  . Secondary hyperparathyroidism (HCC) 10/06/2016  . Hyperkalemia 10/05/2016  . Essential hypertension 10/05/2016  . Acute diverticulitis 08/11/2016  . Spontaneous bacterial peritonitis (HCC) 08/10/2016  . Hypokalemia 08/09/2016  . Peritonitis associated with peritoneal dialysis (HCC) 08/09/2016  . Dialysis-associated peritonitis (HCC) 08/08/2016  . Nerve pain 08/30/2015  . Renal dialysis status 02/23/2014  . Obstructive sleep apnea of adult 12/22/2013  . Low serum cobalamin 12/22/2013  . Acid reflux 12/22/2013  . Dyslipidemia 12/22/2013  . Adiposity 12/19/2013  . BP (high blood pressure) 12/19/2013  . Focal and segmental hyalinosis 12/19/2013  . End stage kidney disease (HCC) 12/19/2013  . Chronic kidney disease 04/11/2013  . Gout, arthropathy 03/05/2013  . Rheumatoid arthritis (HCC) 03/05/2013  . Plantar fasciitis, bilateral 03/05/2013    Past Surgical History:  Procedure Laterality Date  . AV FISTULA PLACEMENT Right 12/12/2016   Procedure: ARTERIOVENOUS  FISTULA CREATION-RIGHT BRACHIOCEPHALIC;  Surgeon: Maeola Harman, MD;  Location: [facility] OR;  Service: Vascular;  Laterality: Right;  . CAPD REMOVAL N/A 08/10/2016   Procedure: CONTINUOUS AMBULATORY PERITONEAL DIALYSIS  (CAPD) CATHETER REMOVAL;  Surgeon: Claud Kelp, MD;  Location: [facility] OR;  Service: General;  Laterality: N/A;  . DIALYSIS/PERMA CATHETER INSERTION Right 08/03/2016  . INSERTION OF DIALYSIS CATHETER  ~ 2015   peritoneal dialysis  . Placement of Hemodialysis Catheter Left   . Removal of hemodialysis catheter         Home Medications    Prior to Admission medications   Medication Sig Start Date End Date Taking? Authorizing Provider  allopurinol  (ZYLOPRIM) 100 MG tablet Take 1 tablet (100 mg total) by mouth every other day. Patient taking differently: Take 100 mg by mouth at bedtime.  11/19/16  Yes Emokpae, Ejiroghene E, MD  amLODipine (NORVASC) 10 MG tablet Take 1 tablet (10 mg total) by mouth every other day. Patient taking differently: Take 10 mg by mouth at bedtime.  10/06/16  Yes Rama, Maryruth Bun, MD  clonazePAM (KLONOPIN) 1 MG tablet Take 1 tablet (1 mg total) 2 (two) times daily as needed by mouth for anxiety. 03/26/17  Yes Ranelle Oyster, MD  furosemide (LASIX) 20 MG tablet Take 2 tablets (40 mg total) by mouth 2 (two) times daily. Patient taking differently: Take 40 mg by mouth every other day.  10/06/16  Yes Rama, Maryruth Bun, MD  gentamicin (GARAMYCIN) 1.6-0.9 MG/ML-% Inject 50 mLs (80 mg total) into the vein Every Tuesday,Thursday,and Saturday with dialysis. 11/21/16  Yes Emokpae, Ejiroghene E, MD  metoprolol tartrate (LOPRESSOR) 100 MG tablet Take 100 mg by mouth at bedtime.  11/07/16  Yes [provider]  oxyCODONE-acetaminophen (PERCOCET) 10-325 MG tablet Take 1 tablet 5 (five) times daily as needed by mouth. For pain. 03/26/17  Yes Ranelle Oyster, MD  OXYCONTIN 30 MG 12 hr tablet Take 1 tablet (30 mg total) every 12 (twelve) hours by mouth. 03/26/17  Yes Ranelle Oyster, MD  pantoprazole (PROTONIX) 40 MG tablet Take 40 mg by mouth at bedtime.    Yes [provider]  pregabalin (LYRICA) 25 MG capsule Take 1 capsule (25 mg total) by mouth daily. Patient taking differently: Take 25 mg by mouth daily as needed (for nerve pain).  11/18/16  Yes Emokpae, Ejiroghene E, MD  promethazine (PHENERGAN) 12.5 MG tablet Take 12.5 mg by mouth at bedtime as needed for nausea or vomiting.  10/03/16  Yes [provider]  sevelamer carbonate (RENVELA) 800 MG tablet Take 2 tablets (1,600 mg total) by mouth 3 (three) times daily with meals. 08/11/16  Yes Short, Thea Silversmith, MD  Vitamin D, Ergocalciferol, (DRISDOL) 50000 UNITS  CAPS capsule Take 50,000 Units by mouth every Wednesday.    Yes [provider]  DULoxetine (CYMBALTA) 20 MG capsule Take 1 capsule (20 mg total) daily by mouth. Patient not taking: Reported on 04/23/2017 03/26/17   Ranelle Oyster, MD    Family History Family History  Problem Relation Age of Onset  . Stroke Mother   . Diabetes Father     Social History Social History   Tobacco Use  . Smoking status: Never Smoker  . Smokeless tobacco: Current User    Types: Snuff  . Tobacco comment: snuff- somedays  Substance Use Topics  . Alcohol use: No  . Drug use: No     Allergies   Sulfa antibiotics   Review of Systems Review of Systems  Constitutional: Negative for fever.  HENT: Negative for rhinorrhea and sore throat.   Eyes: Negative for redness.  Respiratory: Positive for shortness of breath. Negative for cough.   Cardiovascular: Positive for leg swelling. Negative for chest pain.  Gastrointestinal: Negative for abdominal pain, diarrhea, nausea and vomiting.  Genitourinary: Negative for dysuria.  Musculoskeletal: Negative for myalgias.  Skin: Negative for rash.  Neurological: Negative for headaches.     Physical Exam Updated Vital Signs BP (!) 166/97   Pulse 81   Temp 98.9 F (37.2 C) (Oral)   Resp (!) 21   Ht 5\' 6"  (1.676 m)   Wt 98.9 kg (218 lb)   SpO2 100%   BMI 35.19 kg/m   Physical Exam  Constitutional: He appears well-developed and well-nourished.  HENT:  Head: Normocephalic and atraumatic.  Mouth/Throat: Oropharynx is clear and moist.  Eyes: Conjunctivae are normal. Right eye exhibits no discharge. Left eye exhibits no discharge.  Neck: Normal range of motion. Neck supple.  Cardiovascular: Normal rate, regular rhythm and normal heart sounds.  Pulmonary/Chest: Effort normal. No stridor. No respiratory distress. He has no wheezes. He has rales (bilateral).  Dialysis catheter noted in left chest wall.  Abdominal: Soft. There is no tenderness.  There is no rebound and no guarding.  Musculoskeletal: He exhibits edema. He exhibits no tenderness.  AV fistula right upper extremity not use.  No tenderness.  Palpable thrill.  Neurological: He is alert.  Skin: Skin is warm and dry.  Psychiatric: He has a normal mood and affect.  Nursing note and vitals reviewed.    ED Treatments / Results  Labs (all labs ordered are listed, but only abnormal results are displayed) Labs Reviewed  BRAIN NATRIURETIC PEPTIDE - Abnormal; Notable for the following components:      Result Value   B Natriuretic Peptide 836.9 (*)    All other components within normal limits  BASIC METABOLIC PANEL - Abnormal; Notable for the following components:   Potassium 5.7 (*)    CO2 17 (*)    Glucose, Bld 122 (*)    BUN 86 (*)    Creatinine, Ser 15.84 (*)    Calcium 7.5 (*)    GFR calc non Af Amer 3 (*)    GFR calc Af Amer 4 (*)    Anion gap 17 (*)    All other components within normal limits  CBC - Abnormal; Notable for the following components:   RBC 3.02 (*)    Hemoglobin 8.9 (*)    HCT 27.4 (*)    RDW 16.2 (*)    Platelets 127 (*)    All other components within normal limits  I-STAT CHEM 8, ED - Abnormal; Notable for the following components:   Potassium 5.5 (*)    BUN 86 (*)    Creatinine, Ser 16.90 (*)    Glucose, Bld 118 (*)    Calcium, Ion 0.95 (*)    TCO2 16 (*)    Hemoglobin 8.8 (*)    HCT 26.0 (*)    All other components within normal limits    EKG  EKG Interpretation  Date/Time:  Monday April 23 2017 14:05:58 EST Ventricular Rate:  97 PR Interval:  164 QRS Duration: 88 QT Interval:  350 QTC Calculation: 444 R Axis:   104 Text Interpretation:  Poor data quality, interpretation may be adversely affected Normal sinus rhythm Rightward axis RSR' or QR pattern in V1 suggests right ventricular conduction delay Borderline ECG No significant change since last tracing Confirmed by [id number] [phone]) on 04/23/2017 5:47:24 PM        Radiology Dg Chest 2 View  Result Date: 04/23/2017 CLINICAL DATA:  Needs dialysis.  Shortness of breath EXAM: CHEST  2 VIEW COMPARISON:  11/15/2016 FINDINGS: Dialysis catheter on the left with tip  at the upper cavoatrial junction. Chronic cardiomegaly. Diffuse vascular congestion with interstitial coarsening. No effusion or pneumothorax. No air bronchogram. IMPRESSION: Pulmonary edema and chronic cardiomegaly. Electronically Signed   By: Marnee Spring M.D.   On: 04/23/2017 14:40    Procedures Procedures (including critical care time)  Medications Ordered in ED Medications - No data to display   Initial Impression / Assessment and Plan / ED Course  I have reviewed the triage vital signs and the nursing notes.  Pertinent labs & imaging results that were available during my care of the patient were reviewed by me and considered in my medical decision making (see chart for details).     Patient seen and examined. Work-up initiated. Discussed with Dr. Adela Lank. Will discuss with nephrology.   Vital signs reviewed and are as follows: BP (!) 166/97   Pulse 81   Temp 98.9 F (37.2 C) (Oral)   Resp (!) 21   Ht 5\' 6"  (1.676 m)   Wt 98.9 kg (218 lb)   SpO2 100%   BMI 35.19 kg/m   8:10 PM Spoke with Dr. . Pt unable to have dialysis tonight. Asked for obs admit so dialysis can be done first thing in the morning.   Spoke with Dr. Lacy Duverney who will see patient and admit.  Final Clinical Impressions(s) / ED Diagnoses   Final diagnoses:  Acute pulmonary edema (HCC)  Hyperkalemia  ESRD on hemodialysis [facility])   Patient with acute pulmonary edema and mild hyperkalemia in setting of several missed dialysis sessions.  Will admit for dialysis tomorrow morning.  ED Discharge Orders    None       [facility], PA-C 04/23/17 2012    04/25/17, PA-C 04/23/17 2039    2040, DO 04/23/17 2054

## 2017-04-23 NOTE — Consult Note (Signed)
Reason for Consult: To manage dialysis and dialysis related needs Referring Physician: Rayven Hendrickson is an 41 y.o. male with past medical history significant for patient says rheumatoid arthritis and chronic pain, anxiety/depression, hypertension, OSA, as well as ESRD sounds like secondary to urologic reasons previously on PD-but has been on hemodialysis at least for the last 6 months.  He reports that his dialysis center is 25 miles from his house and transportation is difficult.  He had been turned down by the dialysis unit close to his house but now according to him he has arranged a trial at the [facility] unit that was to start on Thursday to see if he could be accepted as a permanent patient.  His last dialysis at his old unit was Tuesday, December 11.  It is unclear but he may have just been trying to make it until Thursday so that he could go close to home.  He went to the emergency department in [facility] yesterday, however his chronic pain was not being treated so he left AMA and came to this emergency department.  Here, his potassium was slightly high, he does have evidence of pulmonary edema but is not hypoxic.  We are contacted to provide hemodialysis.  He does not have an emergent need at this time.  Due to the busy inpatient dialysis schedule he will not be able to be run until tomorrow morning.  Currently, patient is complaining of pain   Dialyzes at the unit in [place] which is 25 miles from his home EDW 84. HD Bath unknown, Dialyzer unknown, Heparin unknown. Access he has a PermCath in his left chest.  He has a fistula which is close to being ready, recent intervention however has not been stuck yet.  Past Medical History:  Diagnosis Date  . Anxiety   . Arthritis    "from my knees down" (08/08/2016)- RA  . Childhood asthma   . 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 Dialysis-associated peritonitis ([place]) 08/08/2016  . Eczema   . ESRD on peritoneal dialysis  ([place])    "01/29/2014-08/07/2016" (08/08/2016)  . GERD (gastroesophageal reflux disease)   . Heel spur    "both feet" (08/08/2016)  . Hypertension   . Noncompliance   . OSA on CPAP   . Pain management    "for pain BLE; knees down thru feet" (08/08/2016)  . Peritoneal dialysis catheter in place [facility])   . Restless leg     Past Surgical History:  Procedure Laterality Date  . AV FISTULA PLACEMENT Right 12/12/2016   Procedure: ARTERIOVENOUS  FISTULA CREATION-RIGHT BRACHIOCEPHALIC;  Surgeon: Waynetta Sandy, MD;  Location: [place];  Service: Vascular;  Laterality: Right;  . CAPD REMOVAL N/A 08/10/2016   Procedure: CONTINUOUS AMBULATORY PERITONEAL DIALYSIS  (CAPD) CATHETER REMOVAL;  Surgeon: Fanny Skates, MD;  Location: [place];  Service: General;  Laterality: N/A;  . DIALYSIS/PERMA CATHETER INSERTION Right 08/03/2016  . INSERTION OF DIALYSIS CATHETER  ~ 2015   peritoneal dialysis  . Placement of Hemodialysis Catheter Left   . Removal of hemodialysis catheter      Family History  Problem Relation Age of Onset  . Stroke Mother   . Diabetes Father     Social History:  reports that  has never smoked. His smokeless tobacco use includes snuff. He reports that he does not drink alcohol or use drugs.  Allergies:  Allergies  Allergen Reactions  . Sulfa Antibiotics Anaphylaxis, Itching and Rash  Medications: I have reviewed the patient's current medications.   Results for orders placed or performed during the hospital encounter of 04/23/17 (from the past 48 hour(s))  Brain natriuretic peptide     Status: Abnormal   Collection Time: 04/23/17  2:09 PM  Result Value Ref Range   B Natriuretic Peptide 836.9 (H) 0.0 - 100.0 pg/mL  I-Stat Chem 8, ED     Status: Abnormal   Collection Time: 04/23/17  2:30 PM  Result Value Ref Range   Sodium 138 135 - 145 mmol/L   Potassium 5.5 (H) 3.5 - 5.1 mmol/L   Chloride 106 101 - 111 mmol/L   BUN 86 (H) 6 - 20 mg/dL   Creatinine, Ser 16.90 (H) 0.61 -  1.24 mg/dL   Glucose, Bld 118 (H) 65 - 99 mg/dL   Calcium, Ion 0.95 (L) 1.15 - 1.40 mmol/L   TCO2 16 (L) 22 - 32 mmol/L   Hemoglobin 8.8 (L) 13.0 - 17.0 g/dL   HCT 26.0 (L) 39.0 - 52.0 %  CBC     Status: Abnormal   Collection Time: 04/23/17  3:15 PM  Result Value Ref Range   WBC 8.4 4.0 - 10.5 K/uL   RBC 3.02 (L) 4.22 - 5.81 MIL/uL   Hemoglobin 8.9 (L) 13.0 - 17.0 g/dL   HCT 27.4 (L) 39.0 - 52.0 %   MCV 90.7 78.0 - 100.0 fL   MCH 29.5 26.0 - 34.0 pg   MCHC 32.5 30.0 - 36.0 g/dL   RDW 16.2 (H) 11.5 - 15.5 %   Platelets 127 (L) 150 - 400 K/uL  Basic metabolic panel     Status: Abnormal   Collection Time: 04/23/17  6:09 PM  Result Value Ref Range   Sodium 137 135 - 145 mmol/L   Potassium 5.7 (H) 3.5 - 5.1 mmol/L    Comment: SLIGHT HEMOLYSIS   Chloride 103 101 - 111 mmol/L   CO2 17 (L) 22 - 32 mmol/L   Glucose, Bld 122 (H) 65 - 99 mg/dL   BUN 86 (H) 6 - 20 mg/dL   Creatinine, Ser 15.84 (H) 0.61 - 1.24 mg/dL   Calcium 7.5 (L) 8.9 - 10.3 mg/dL   GFR calc non Af Amer 3 (L) >60 mL/min   GFR calc Af Amer 4 (L) >60 mL/min    Comment: (NOTE) The eGFR has been calculated using the CKD EPI equation. This calculation has not been validated in all clinical situations. eGFR's persistently <60 mL/min signify possible Chronic Kidney Disease.    Anion gap 17 (H) 5 - 15    Dg Chest 2 View  Result Date: 04/23/2017 CLINICAL DATA:  Needs dialysis.  Shortness of breath EXAM: CHEST  2 VIEW COMPARISON:  11/15/2016 FINDINGS: Dialysis catheter on the left with tip at the upper cavoatrial junction. Chronic cardiomegaly. Diffuse vascular congestion with interstitial coarsening. No effusion or pneumothorax. No air bronchogram. IMPRESSION: Pulmonary edema and chronic cardiomegaly. Electronically Signed   By: Monte Fantasia M.D.   On: 04/23/2017 14:40    ROS: Pain all over, shortness of breath Blood pressure (!) 159/96, pulse 87, temperature 98.9 F (37.2 C), temperature source Oral, resp. rate 20,  height '5\' 6"'  (1.676 m), weight 98.9 kg (218 lb), SpO2 100 %. General appearance: distracted, mild distress and moderately obese Neck: no adenopathy, no carotid bruit, no JVD, supple, symmetrical, trachea midline and thyroid not enlarged, symmetric, no tenderness/mass/nodules Resp: diminished breath sounds bibasilar and bilaterally Cardio: regular rate and rhythm, S1, S2 normal,  no murmur, click, rub or gallop GI: soft, non-tender; bowel sounds normal; no masses,  no organomegaly Extremities: edema 1+ A left-sided PermCath which appears clean.  Right upper arm AV fistula with good thrill and bruit evidence of recent intervention  Assessment/Plan: 41 year old white male with chronic pain and ESRD.  In transition from a dialysis unit for away to a dialysis unit close to his home.  As a result has missed some interim dialysis.  He is now volume overloaded and slightly hyperkalemic 1 volume overloaded and slightly hyperkalemic-secondary to missed HD.  Last HD was 12/11.  Plan to do dialysis in the a.m. 2 ESRD: Is due to start a TTS schedule at the [facility] dialysis unit close to his home.  I have encouraged him to use his best behavior so that he can get accepted to this dialysis unit for the long-term.  We will see what he looks like after dialysis tomorrow.  He may need a treatment on Wednesday also to make a good impression for Thursday 3 Hypertension: Blood pressure only slightly high.  Likely secondary to volume overload and pain.  Is on Norvasc and Lopressor as an outpatient 4. Anemia of ESRD: Hemoglobin is quite low in the eights.  He will be given Aranesp with dialysis tomorrow 5. Metabolic Bone Disease: His home medication list indicates Renvela.  This should be continued 6.  Chronic pain-is a complicating issue.  Is followed by Dr. Tessa Lerner as an outpatient and is on chronic narcotics   Hearl Heikes A 04/23/2017, 9:35 PM

## 2017-04-23 NOTE — ED Triage Notes (Addendum)
Pt reports missed dialysis, since last Tuesday. Last night he was taken to [facility] hospital, he reports he laid there all night, without pain meds, he left AMA b/c he was irritated. He feels SOB today. He couldn't get into his dialysis center today. His days are Tuesday, thursdays, saturdays. Appears SOB in triage.

## 2017-04-23 NOTE — ED Notes (Signed)
Pt would like to wait for IV when something is ordered that makes it necessary.  Will let floor nurse know in report also

## 2017-04-23 NOTE — ED Notes (Signed)
"  Kidney, MD" at bedside

## 2017-04-23 NOTE — ED Notes (Signed)
Pt placed on 2L per request states he has PRN O2 at home

## 2017-04-23 NOTE — ED Notes (Signed)
Main lab to add on BMP 

## 2017-04-24 ENCOUNTER — Other Ambulatory Visit: Payer: Self-pay

## 2017-04-24 DIAGNOSIS — N186 End stage renal disease: Secondary | ICD-10-CM | POA: Diagnosis not present

## 2017-04-24 DIAGNOSIS — J810 Acute pulmonary edema: Secondary | ICD-10-CM

## 2017-04-24 DIAGNOSIS — E875 Hyperkalemia: Secondary | ICD-10-CM | POA: Diagnosis not present

## 2017-04-24 DIAGNOSIS — I120 Hypertensive chronic kidney disease with stage 5 chronic kidney disease or end stage renal disease: Secondary | ICD-10-CM | POA: Diagnosis not present

## 2017-04-24 DIAGNOSIS — D631 Anemia in chronic kidney disease: Secondary | ICD-10-CM | POA: Diagnosis not present

## 2017-04-24 DIAGNOSIS — E8770 Fluid overload, unspecified: Secondary | ICD-10-CM | POA: Diagnosis not present

## 2017-04-24 DIAGNOSIS — N2581 Secondary hyperparathyroidism of renal origin: Secondary | ICD-10-CM | POA: Diagnosis not present

## 2017-04-24 DIAGNOSIS — Z992 Dependence on renal dialysis: Secondary | ICD-10-CM | POA: Diagnosis not present

## 2017-04-24 LAB — CBC
HEMATOCRIT: 24.4 % — AB (ref 39.0–52.0)
HEMOGLOBIN: 8 g/dL — AB (ref 13.0–17.0)
MCH: 30.2 pg (ref 26.0–34.0)
MCHC: 32.8 g/dL (ref 30.0–36.0)
MCV: 92.1 fL (ref 78.0–100.0)
Platelets: 121 10*3/uL — ABNORMAL LOW (ref 150–400)
RBC: 2.65 MIL/uL — ABNORMAL LOW (ref 4.22–5.81)
RDW: 16.4 % — AB (ref 11.5–15.5)
WBC: 9.6 10*3/uL (ref 4.0–10.5)

## 2017-04-24 LAB — BASIC METABOLIC PANEL
ANION GAP: 17 — AB (ref 5–15)
BUN: 90 mg/dL — AB (ref 6–20)
CHLORIDE: 104 mmol/L (ref 101–111)
CO2: 14 mmol/L — ABNORMAL LOW (ref 22–32)
Calcium: 7.5 mg/dL — ABNORMAL LOW (ref 8.9–10.3)
Creatinine, Ser: 16.5 mg/dL — ABNORMAL HIGH (ref 0.61–1.24)
GFR calc Af Amer: 4 mL/min — ABNORMAL LOW (ref >60)
GFR, EST NON AFRICAN AMERICAN: 3 mL/min — AB (ref >60)
GLUCOSE: 95 mg/dL (ref 65–99)
POTASSIUM: 6.2 mmol/L — AB (ref 3.5–5.1)
Sodium: 135 mmol/L (ref 135–145)

## 2017-04-24 LAB — MRSA PCR SCREENING: MRSA by PCR: NEGATIVE

## 2017-04-24 MED ORDER — SODIUM CHLORIDE 0.9 % IV SOLN: 100.0000 mL | INTRAVENOUS | Status: DC | PRN

## 2017-04-24 MED ORDER — LIDOCAINE HCL (PF) 1 % IJ SOLN: 5.0000 mL | INTRAMUSCULAR | Status: DC | PRN

## 2017-04-24 MED ORDER — HEPARIN SODIUM (PORCINE) 1000 UNIT/ML DIALYSIS
40.0000 [IU]/kg | INTRAMUSCULAR | Status: DC | PRN
Start: 2017-04-24 — End: 2017-04-24

## 2017-04-24 MED ORDER — PENTAFLUOROPROP-TETRAFLUOROETH EX AERO: 1.0000 "application " | INHALATION_SPRAY | CUTANEOUS | Status: DC | PRN

## 2017-04-24 MED ORDER — HEPARIN SODIUM (PORCINE) 1000 UNIT/ML DIALYSIS: 1000.0000 [IU] | INTRAMUSCULAR | Status: DC | PRN

## 2017-04-24 MED ORDER — OXYCODONE HCL 5 MG PO TABS
ORAL_TABLET | ORAL | Status: AC
  Administered 2017-04-24: 5 mg via ORAL
  Filled 2017-04-24: qty 1

## 2017-04-24 MED ORDER — ALTEPLASE 2 MG IJ SOLR: 2.0000 mg | Freq: Once | INTRAMUSCULAR | Status: DC | PRN

## 2017-04-24 MED ORDER — OXYCODONE-ACETAMINOPHEN 5-325 MG PO TABS
ORAL_TABLET | ORAL | Status: AC
  Filled 2017-04-24: qty 1

## 2017-04-24 MED ORDER — SEVELAMER CARBONATE 800 MG PO TABS
1600.0000 mg | ORAL_TABLET | Freq: Three times a day (TID) | ORAL | Status: DC
  Administered 2017-04-25 – 2017-04-26 (×3): 1600 mg via ORAL
  Filled 2017-04-24 (×3): qty 2

## 2017-04-24 MED ORDER — LIDOCAINE-PRILOCAINE 2.5-2.5 % EX CREA: 1.0000 "application " | TOPICAL_CREAM | CUTANEOUS | Status: DC | PRN

## 2017-04-24 MED ORDER — DARBEPOETIN ALFA 150 MCG/0.3ML IJ SOSY
PREFILLED_SYRINGE | INTRAMUSCULAR | Status: AC
  Filled 2017-04-24: qty 0.3

## 2017-04-24 MED ORDER — HEPARIN SODIUM (PORCINE) 1000 UNIT/ML DIALYSIS: 20.0000 [IU]/kg | INTRAMUSCULAR | Status: DC | PRN

## 2017-04-24 MED ORDER — DARBEPOETIN ALFA 150 MCG/0.3ML IJ SOSY
150.0000 ug | PREFILLED_SYRINGE | INTRAMUSCULAR | Status: DC
  Administered 2017-04-24: 150 ug via INTRAVENOUS

## 2017-04-24 NOTE — Procedures (Signed)
Patient seen on Hemodialysis. QB 365, UF goal 6L Treatment adjusted as needed.  Zetta Bills MD [facility]. Office # [phone] Pager # [phone] 3:01 PM

## 2017-04-24 NOTE — Progress Notes (Signed)
PROGRESS NOTE    James Kim  HMC:[id number] DOB: 11/28/1975 DOA: 04/23/2017 PCP: Beatriz Stallion, MD     Brief Narrative:  James Kim is a 41 y.o. male with medical history significant of ESRD who comes in after not having dialysis for a week due to transportation issues with his dialysis unit.  Pt reports he is in the process of being set up at a closer dialysis place for him.  He cannot make it to his current one because it is too far away from where he lives.  He was found to have pulm edema with normal O2 sats. Nephro was consulted.   Assessment & Plan:   Principal Problem:   Dialysis patient [facility]) Active Problems:   Essential hypertension   Noncompliance   Anemia of chronic disease   Acute pulmonary edema (HCC)   ESRD on HD with volume overload and hyperkalemia -Due to missing HD over the past week -Nephrology consulted  Medical noncompliance -Hopefully with new dialysis center, it wont be an issue going forward  HTN -Continue norvasc   Anemia of chronic kidney disease -Stable    DVT prophylaxis: SCD Code Status: Full Family Communication: No family at bedside Disposition Plan: Pending improvement, possible repeat HD 12/19 then could potentially discharge    Consultants:   Nephrology  Procedures:   None  Antimicrobials:  Anti-infectives (From admission, onward)   None        Subjective: No new events.  States that he feels short of breath.    Objective: Vitals:   04/24/17 0802 04/24/17 1326 04/24/17 1335 04/24/17 1400  BP: (!) 143/79 (!) 162/89 (!) 163/87 (!) 152/63  Pulse: (!) 118 (!) 104 (!) 104 (!) 104  Resp: 18 18 18 19   Temp: 97.6 F (36.4 C) 99 F (37.2 C)    TempSrc: Oral Oral    SpO2: 100% 99%    Weight:  97.2 kg (214 lb 4.6 oz)    Height:        Intake/Output Summary (Last 24 hours) at 04/24/2017 1415 Last data filed at 04/24/2017 1410 Gross per 24 hour  Intake 460 ml  Output 101 ml  Net 359 ml   Filed  Weights   04/23/17 1403 04/23/17 2229 04/24/17 1326  Weight: 98.9 kg (218 lb) 95.1 kg (209 lb 11.2 oz) 97.2 kg (214 lb 4.6 oz)    Examination:  General exam: Appears calm and comfortable  Respiratory system: Clear to auscultation. Respiratory effort normal. Cardiovascular system: S1 & S2 heard, tachycardic, regular rhythm. No JVD, murmurs, rubs, gallops or clicks. No pedal edema. Gastrointestinal system: Abdomen is nondistended, soft and nontender. No organomegaly or masses felt. Normal bowel sounds heard. Central nervous system: Alert and oriented. No focal neurological deficits. Extremities: Symmetric 5 x 5 power. Skin: No rashes, lesions or ulcers Psychiatry: Judgement and insight appear normal. Mood & affect appropriate.   Data Reviewed: I have personally reviewed following labs and imaging studies  CBC: Recent Labs  Lab 04/23/17 1430 04/23/17 1515 04/24/17 0727  WBC  --  8.4 9.6  HGB 8.8* 8.9* 8.0*  HCT 26.0* 27.4* 24.4*  MCV  --  90.7 92.1  PLT  --  127* 121*   Basic Metabolic Panel: Recent Labs  Lab 04/23/17 1430 04/23/17 1809 04/24/17 0727  NA 138 137 135  K 5.5* 5.7* 6.2*  CL 106 103 104  CO2  --  17* 14*  GLUCOSE 118* 122* 95  BUN 86* 86* 90*  CREATININE  16.90* 15.84* 16.50*  CALCIUM  --  7.5* 7.5*   GFR: Estimated Creatinine Clearance: 6.6 mL/min (A) (by C-G formula based on SCr of 16.5 mg/dL (H)). Liver Function Tests: No results for input(s): AST, ALT, ALKPHOS, BILITOT, PROT, ALBUMIN in the last 168 hours. No results for input(s): LIPASE, AMYLASE in the last 168 hours. No results for input(s): AMMONIA in the last 168 hours. Coagulation Profile: No results for input(s): INR, PROTIME in the last 168 hours. Cardiac Enzymes: No results for input(s): CKTOTAL, CKMB, CKMBINDEX, TROPONINI in the last 168 hours. BNP (last 3 results) No results for input(s): PROBNP in the last 8760 hours. HbA1C: No results for input(s): HGBA1C in the last 72  hours. CBG: No results for input(s): GLUCAP in the last 168 hours. Lipid Profile: No results for input(s): CHOL, HDL, LDLCALC, TRIG, CHOLHDL, LDLDIRECT in the last 72 hours. Thyroid Function Tests: No results for input(s): TSH, T4TOTAL, FREET4, T3FREE, THYROIDAB in the last 72 hours. Anemia Panel: No results for input(s): VITAMINB12, FOLATE, FERRITIN, TIBC, IRON, RETICCTPCT in the last 72 hours. Sepsis Labs: No results for input(s): PROCALCITON, LATICACIDVEN in the last 168 hours.  Recent Results (from the past 240 hour(s))  MRSA PCR Screening     Status: None   Collection Time: 04/23/17 11:17 PM  Result Value Ref Range Status   MRSA by PCR NEGATIVE NEGATIVE Final    Comment:        The GeneXpert MRSA Assay (FDA approved for NASAL specimens only), is one component of a comprehensive MRSA colonization surveillance program. It is not intended to diagnose MRSA infection nor to guide or monitor treatment for MRSA infections.        Radiology Studies: Dg Chest 2 View  Result Date: 04/23/2017 CLINICAL DATA:  Needs dialysis.  Shortness of breath EXAM: CHEST  2 VIEW COMPARISON:  11/15/2016 FINDINGS: Dialysis catheter on the left with tip at the upper cavoatrial junction. Chronic cardiomegaly. Diffuse vascular congestion with interstitial coarsening. No effusion or pneumothorax. No air bronchogram. IMPRESSION: Pulmonary edema and chronic cardiomegaly. Electronically Signed   By: Marnee Spring M.D.   On: 04/23/2017 14:40      Scheduled Meds: . amLODipine  10 mg Oral QHS  . darbepoetin (ARANESP) injection - DIALYSIS  150 mcg Intravenous Q Tue-HD  . sevelamer carbonate  1,600 mg Oral TID WC  . sodium chloride flush  3 mL Intravenous Q12H   Continuous Infusions: . sodium chloride    . sodium chloride    . sodium chloride       LOS: 0 days    Time spent: 40 minutes   Noralee Stain, DO [facility] [email or web address] Password [email or web address] 04/24/2017, 2:15 PM

## 2017-04-24 NOTE — Progress Notes (Signed)
Hemodialysis  Left CVC arterial port clamp broken at top portion,  Unable to clamp ,Dr Eliott Nine notified. Lumen intact and cap place, secured with tape

## 2017-04-24 NOTE — Progress Notes (Signed)
Patient ID: James Kim, male   DOB: 05-10-1975, 41 y.o.   MRN: [id number]  [facility] Progress Note   Assessment/ Plan:   1 Volume overloaded and slightly hyperkalemic-secondary to missed HD.  Last HD was 12/11.  Plan to do dialysis today. 2 ESRD:  he has now been accepted to a new dialysis center on probation in [place], [place] and understands his commitment to complying with dialysis treatments/medications. To undergo dialysis today here in the hospital and reevaluation for possible dialysis again tomorrow. 3 Hypertension:  blood pressure marginally elevated-monitor with ultrafiltration and hemodialysis 4. Anemia of ESRD:  low hemoglobin noted, likely due to inability to get ESA with missed hemodialysis treatments. Aranesp today. 5. Metabolic Bone Disease:  continue Renvela for phosphorus binding as well as renal diet while here in the hospital.  6.  Chronic pain-is a complicating issue.  Is followed by Dr. Hermelinda Medicus as an outpatient and is on chronic narcotics  Subjective:   Reports a comfortable night-looking forward to getting dialysis this morning.    Objective:   BP (!) 143/79 (BP Location: Left Arm)   Pulse (!) 118   Temp 97.6 F (36.4 C) (Oral)   Resp 18   Ht 5\' 6"  (1.676 m)   Wt 95.1 kg (209 lb 11.2 oz)   SpO2 100%   BMI 33.85 kg/m   Physical Exam: Gen: Comfortably sleeping in bed, watching television CVS: Pulse regular tachycardia, S1 and S2 normal Resp: Fine rales both bases, no distinct rhonchi  Abd: Soft, obese, nontender, bowel sounds normal Ext: 1+ lower extremity edema bilaterally, left IJ TDC, right BCF with recent angiogram sheath scar  Labs: BMET Recent Labs  Lab 04/23/17 1430 04/23/17 1809 04/24/17 0727  NA 138 137 135  K 5.5* 5.7* 6.2*  CL 106 103 104  CO2  --  17* 14*  GLUCOSE 118* 122* 95  BUN 86* 86* 90*  CREATININE 16.90* 15.84* 16.50*  CALCIUM  --  7.5* 7.5*   CBC Recent Labs  Lab 04/23/17 1430 04/23/17 1515  04/24/17 0727  WBC  --  8.4 9.6  HGB 8.8* 8.9* 8.0*  HCT 26.0* 27.4* 24.4*  MCV  --  90.7 92.1  PLT  --  127* 121*   Medications:    . amLODipine  10 mg Oral QHS  . sodium chloride flush  3 mL Intravenous Q12H   04/26/17, MD 04/24/2017, 11:11 AM

## 2017-04-25 ENCOUNTER — Encounter: Payer: Medicare Other | Admitting: Registered Nurse

## 2017-04-25 ENCOUNTER — Observation Stay (HOSPITAL_COMMUNITY): Payer: Medicare Other

## 2017-04-25 DIAGNOSIS — G8929 Other chronic pain: Secondary | ICD-10-CM | POA: Diagnosis present

## 2017-04-25 DIAGNOSIS — Z823 Family history of stroke: Secondary | ICD-10-CM | POA: Diagnosis not present

## 2017-04-25 DIAGNOSIS — F17290 Nicotine dependence, other tobacco product, uncomplicated: Secondary | ICD-10-CM | POA: Diagnosis present

## 2017-04-25 DIAGNOSIS — Y95 Nosocomial condition: Secondary | ICD-10-CM | POA: Diagnosis present

## 2017-04-25 DIAGNOSIS — Z9115 Patient's noncompliance with renal dialysis: Secondary | ICD-10-CM | POA: Diagnosis not present

## 2017-04-25 DIAGNOSIS — N186 End stage renal disease: Secondary | ICD-10-CM | POA: Diagnosis not present

## 2017-04-25 DIAGNOSIS — D631 Anemia in chronic kidney disease: Secondary | ICD-10-CM | POA: Diagnosis present

## 2017-04-25 DIAGNOSIS — N2581 Secondary hyperparathyroidism of renal origin: Secondary | ICD-10-CM | POA: Diagnosis not present

## 2017-04-25 DIAGNOSIS — E8770 Fluid overload, unspecified: Secondary | ICD-10-CM | POA: Diagnosis not present

## 2017-04-25 DIAGNOSIS — Z992 Dependence on renal dialysis: Secondary | ICD-10-CM | POA: Diagnosis not present

## 2017-04-25 DIAGNOSIS — Z833 Family history of diabetes mellitus: Secondary | ICD-10-CM | POA: Diagnosis not present

## 2017-04-25 DIAGNOSIS — G2581 Restless legs syndrome: Secondary | ICD-10-CM | POA: Diagnosis present

## 2017-04-25 DIAGNOSIS — E875 Hyperkalemia: Secondary | ICD-10-CM | POA: Diagnosis present

## 2017-04-25 DIAGNOSIS — E8779 Other fluid overload: Secondary | ICD-10-CM | POA: Diagnosis present

## 2017-04-25 DIAGNOSIS — Z79891 Long term (current) use of opiate analgesic: Secondary | ICD-10-CM | POA: Diagnosis not present

## 2017-04-25 DIAGNOSIS — Z882 Allergy status to sulfonamides status: Secondary | ICD-10-CM | POA: Diagnosis not present

## 2017-04-25 DIAGNOSIS — I1311 Hypertensive heart and chronic kidney disease without heart failure, with stage 5 chronic kidney disease, or end stage renal disease: Secondary | ICD-10-CM | POA: Diagnosis present

## 2017-04-25 DIAGNOSIS — M069 Rheumatoid arthritis, unspecified: Secondary | ICD-10-CM | POA: Diagnosis present

## 2017-04-25 DIAGNOSIS — J189 Pneumonia, unspecified organism: Secondary | ICD-10-CM | POA: Diagnosis not present

## 2017-04-25 DIAGNOSIS — I120 Hypertensive chronic kidney disease with stage 5 chronic kidney disease or end stage renal disease: Secondary | ICD-10-CM | POA: Diagnosis not present

## 2017-04-25 DIAGNOSIS — J810 Acute pulmonary edema: Secondary | ICD-10-CM | POA: Diagnosis not present

## 2017-04-25 LAB — CBC
HEMATOCRIT: 27.4 % — AB (ref 39.0–52.0)
Hemoglobin: 8.9 g/dL — ABNORMAL LOW (ref 13.0–17.0)
MCH: 29.8 pg (ref 26.0–34.0)
MCHC: 32.5 g/dL (ref 30.0–36.0)
MCV: 91.6 fL (ref 78.0–100.0)
PLATELETS: 125 10*3/uL — AB (ref 150–400)
RBC: 2.99 MIL/uL — ABNORMAL LOW (ref 4.22–5.81)
RDW: 15.8 % — AB (ref 11.5–15.5)
WBC: 9.3 10*3/uL (ref 4.0–10.5)

## 2017-04-25 LAB — BASIC METABOLIC PANEL
Anion gap: 15 (ref 5–15)
BUN: 41 mg/dL — AB (ref 6–20)
CO2: 24 mmol/L (ref 22–32)
CREATININE: 9.17 mg/dL — AB (ref 0.61–1.24)
Calcium: 8 mg/dL — ABNORMAL LOW (ref 8.9–10.3)
Chloride: 98 mmol/L — ABNORMAL LOW (ref 101–111)
GFR, EST AFRICAN AMERICAN: 7 mL/min — AB (ref >60)
GFR, EST NON AFRICAN AMERICAN: 6 mL/min — AB (ref >60)
Glucose, Bld: 86 mg/dL (ref 65–99)
POTASSIUM: 4.5 mmol/L (ref 3.5–5.1)
SODIUM: 137 mmol/L (ref 135–145)

## 2017-04-25 LAB — INFLUENZA PANEL BY PCR (TYPE A & B)
INFLAPCR: NEGATIVE
Influenza B By PCR: NEGATIVE

## 2017-04-25 LAB — EXPECTORATED SPUTUM ASSESSMENT W GRAM STAIN, RFLX TO RESP C

## 2017-04-25 LAB — EXPECTORATED SPUTUM ASSESSMENT W REFEX TO RESP CULTURE

## 2017-04-25 LAB — HEPATITIS B SURFACE ANTIGEN: Hepatitis B Surface Ag: NEGATIVE

## 2017-04-25 LAB — HEPATITIS B CORE ANTIBODY, TOTAL: HEP B C TOTAL AB: NEGATIVE

## 2017-04-25 LAB — HEPATITIS B SURFACE ANTIBODY,QUALITATIVE: Hep B S Ab: REACTIVE

## 2017-04-25 MED ORDER — ALTEPLASE 2 MG IJ SOLR: 2.0000 mg | Freq: Once | INTRAMUSCULAR | Status: DC | PRN

## 2017-04-25 MED ORDER — HEPARIN SODIUM (PORCINE) 1000 UNIT/ML DIALYSIS: 1000.0000 [IU] | INTRAMUSCULAR | Status: DC | PRN

## 2017-04-25 MED ORDER — PENTAFLUOROPROP-TETRAFLUOROETH EX AERO: 1.0000 "application " | INHALATION_SPRAY | CUTANEOUS | Status: DC | PRN

## 2017-04-25 MED ORDER — SODIUM CHLORIDE 0.9 % IV SOLN: 100.0000 mL | INTRAVENOUS | Status: DC | PRN

## 2017-04-25 MED ORDER — LIDOCAINE-PRILOCAINE 2.5-2.5 % EX CREA: 1.0000 "application " | TOPICAL_CREAM | CUTANEOUS | Status: DC | PRN

## 2017-04-25 MED ORDER — HEPARIN SODIUM (PORCINE) 1000 UNIT/ML DIALYSIS: 40.0000 [IU]/kg | INTRAMUSCULAR | Status: DC | PRN

## 2017-04-25 MED ORDER — LIDOCAINE HCL (PF) 1 % IJ SOLN: 5.0000 mL | INTRAMUSCULAR | Status: DC | PRN

## 2017-04-25 MED ORDER — CLONAZEPAM 1 MG PO TABS
1.0000 mg | ORAL_TABLET | Freq: Two times a day (BID) | ORAL | Status: DC | PRN
  Administered 2017-04-25: 1 mg via ORAL
  Filled 2017-04-25: qty 1

## 2017-04-25 MED ORDER — DEXTROSE 5 % IV SOLN
1.0000 g | INTRAVENOUS | Status: DC
  Administered 2017-04-25: 1 g via INTRAVENOUS
  Filled 2017-04-25 (×2): qty 1

## 2017-04-25 NOTE — Procedures (Signed)
Patient seen on Hemodialysis. QB 400, UF goal 3.5L Treatment adjusted as needed.  Zetta Bills MD [facility]. Office # [phone] Pager # [phone] 2:48 PM

## 2017-04-25 NOTE — Progress Notes (Signed)
PROGRESS NOTE    James Kim  ZJI:[id number] DOB: 18-Dec-1975 DOA: 04/23/2017 PCP: Beatriz Stallion, MD     Brief Narrative:  James Kim is a 41 y.o. male with medical history significant of ESRD who comes in after not having dialysis for a week due to transportation issues with his dialysis unit.  Pt reports he is in the process of being set up at a closer dialysis place for him.  He cannot make it to his current one because it is too far away from where he lives.  He was found to have pulm edema with normal O2 sats. Nephro was consulted.   Assessment & Plan:   Principal Problem:   Dialysis patient [facility]) Active Problems:   Essential hypertension   Noncompliance   Anemia of chronic disease   Acute pulmonary edema (HCC)   ESRD on HD with volume overload and hyperkalemia -Due to missing HD over the past week -Nephrology consulted. HD 12/18 and again 12/19   HCAP -New fever this morning 101.4. CXR ordered which revealed right sided pneumonia. Patient has been coughing white/clear phlegm and states he felt horrible all night. Will start cefepime and watch fever curve. Check influenza. MRSA PCR negative.   Medical noncompliance -Hopefully with new dialysis center, it wont be an issue going forward  HTN -Continue norvasc   Anemia of chronic kidney disease -Stable    DVT prophylaxis: SCD Code Status: Full Family Communication: No family at bedside Disposition Plan: Pending improvement, discharge tomorrow if remains afebrile   Consultants:   Nephrology  Procedures:   None  Antimicrobials:  Anti-infectives (From admission, onward)   Start     Dose/Rate Route Frequency Ordered Stop   04/25/17 1200  ceFEPIme (MAXIPIME) 1 g in dextrose 5 % 50 mL IVPB     1 g 100 mL/hr over 30 Minutes Intravenous Every 24 hours 04/25/17 0944 05/03/17 1159       Subjective: He states that he felt horrible all day yesterday with generalized pain.  Had a fever early this  morning 101.4.  Has been coughing productive sputum of white/clear phlegm.  Objective: Vitals:   04/24/17 2026 04/24/17 2358 04/25/17 0539 04/25/17 0845  BP: (!) 149/84  (!) 162/87 (!) 145/61  Pulse: (!) 117  (!) 106 (!) 111  Resp: (!) 22  20 19   Temp: (!) 101.4 F (38.6 C) 100 F (37.8 C) 99.2 F (37.3 C) 99.5 F (37.5 C)  TempSrc: Oral Oral Oral Oral  SpO2: 94%  97% 100%  Weight: 91.8 kg (202 lb 6.4 oz)     Height:        Intake/Output Summary (Last 24 hours) at 04/25/2017 1243 Last data filed at 04/25/2017 0845 Gross per 24 hour  Intake 700 ml  Output 4670 ml  Net -3970 ml   Filed Weights   04/24/17 1326 04/24/17 1728 04/24/17 2026  Weight: 97.2 kg (214 lb 4.6 oz) 93.6 kg (206 lb 5.6 oz) 91.8 kg (202 lb 6.4 oz)    Examination:  General exam: Appears calm and comfortable  Respiratory system: Clear to auscultation. Respiratory effort normal. Cardiovascular system: S1 & S2 heard, tachycardic, regular rhythm. No JVD, murmurs, rubs, gallops or clicks. No pedal edema. Gastrointestinal system: Abdomen is nondistended, soft and nontender. No organomegaly or masses felt. Normal bowel sounds heard. Central nervous system: Alert and oriented. No focal neurological deficits. Extremities: Symmetric 5 x 5 power. Skin: No rashes, lesions or ulcers Psychiatry: Judgement and insight appear normal.  Mood & affect appropriate.   Data Reviewed: I have personally reviewed following labs and imaging studies  CBC: Recent Labs  Lab 04/23/17 1430 04/23/17 1515 04/24/17 0727 04/25/17 0822  WBC  --  8.4 9.6 9.3  HGB 8.8* 8.9* 8.0* 8.9*  HCT 26.0* 27.4* 24.4* 27.4*  MCV  --  90.7 92.1 91.6  PLT  --  127* 121* 125*   Basic Metabolic Panel: Recent Labs  Lab 04/23/17 1430 04/23/17 1809 04/24/17 0727 04/25/17 0511  NA 138 137 135 137  K 5.5* 5.7* 6.2* 4.5  CL 106 103 104 98*  CO2  --  17* 14* 24  GLUCOSE 118* 122* 95 86  BUN 86* 86* 90* 41*  CREATININE 16.90* 15.84* 16.50*  9.17*  CALCIUM  --  7.5* 7.5* 8.0*   GFR: Estimated Creatinine Clearance: 11.5 mL/min (A) (by C-G formula based on SCr of 9.17 mg/dL (H)). Liver Function Tests: No results for input(s): AST, ALT, ALKPHOS, BILITOT, PROT, ALBUMIN in the last 168 hours. No results for input(s): LIPASE, AMYLASE in the last 168 hours. No results for input(s): AMMONIA in the last 168 hours. Coagulation Profile: No results for input(s): INR, PROTIME in the last 168 hours. Cardiac Enzymes: No results for input(s): CKTOTAL, CKMB, CKMBINDEX, TROPONINI in the last 168 hours. BNP (last 3 results) No results for input(s): PROBNP in the last 8760 hours. HbA1C: No results for input(s): HGBA1C in the last 72 hours. CBG: No results for input(s): GLUCAP in the last 168 hours. Lipid Profile: No results for input(s): CHOL, HDL, LDLCALC, TRIG, CHOLHDL, LDLDIRECT in the last 72 hours. Thyroid Function Tests: No results for input(s): TSH, T4TOTAL, FREET4, T3FREE, THYROIDAB in the last 72 hours. Anemia Panel: No results for input(s): VITAMINB12, FOLATE, FERRITIN, TIBC, IRON, RETICCTPCT in the last 72 hours. Sepsis Labs: No results for input(s): PROCALCITON, LATICACIDVEN in the last 168 hours.  Recent Results (from the past 240 hour(s))  MRSA PCR Screening     Status: None   Collection Time: 04/23/17 11:17 PM  Result Value Ref Range Status   MRSA by PCR NEGATIVE NEGATIVE Final    Comment:        The GeneXpert MRSA Assay (FDA approved for NASAL specimens only), is one component of a comprehensive MRSA colonization surveillance program. It is not intended to diagnose MRSA infection nor to guide or monitor treatment for MRSA infections.        Radiology Studies: Dg Chest 2 View  Result Date: 04/23/2017 CLINICAL DATA:  Needs dialysis.  Shortness of breath EXAM: CHEST  2 VIEW COMPARISON:  11/15/2016 FINDINGS: Dialysis catheter on the left with tip at the upper cavoatrial junction. Chronic cardiomegaly. Diffuse  vascular congestion with interstitial coarsening. No effusion or pneumothorax. No air bronchogram. IMPRESSION: Pulmonary edema and chronic cardiomegaly. Electronically Signed   By: Marnee Spring M.D.   On: 04/23/2017 14:40   Dg Chest Port 1 View  Result Date: 04/25/2017 CLINICAL DATA:  Fever, pulmonary edema. EXAM: PORTABLE CHEST 1 VIEW COMPARISON:  Radiographs of April 23, 2017. FINDINGS: Stable cardiomediastinal silhouette. No pneumothorax is noted. Left-sided dialysis catheter is unchanged in position. Left lung is clear. Patchy opacities are noted throughout the right lung which may represent some degree of pneumonia. Bony thorax is unremarkable. IMPRESSION: Patchy opacities are noted throughout the right lung concerning for possible pneumonia or possibly edema. Followup PA and lateral chest X-ray is recommended in 3-4 weeks following trial of antibiotic therapy to ensure resolution and exclude underlying malignancy. Electronically  Signed   By: Lupita Raider, M.D.   On: 04/25/2017 09:29      Scheduled Meds: . amLODipine  10 mg Oral QHS  . darbepoetin (ARANESP) injection - DIALYSIS  150 mcg Intravenous Q Tue-HD  . sevelamer carbonate  1,600 mg Oral TID WC  . sodium chloride flush  3 mL Intravenous Q12H   Continuous Infusions: . sodium chloride    . ceFEPime (MAXIPIME) IV       LOS: 0 days    Time spent: 30 minutes   Noralee Stain, DO [facility] [email or web address] Password [facility] 04/25/2017, 12:43 PM

## 2017-04-25 NOTE — Progress Notes (Signed)
Patient ID: James Kim, male   DOB: July 05, 1975, 41 y.o.   MRN: [id number]  [facility] Progress Note   Assessment/ Plan:   1 Volume overloaded and slightly hyperkalemic-secondary to missed HD.  Last HD was 12/11. Volume status significantly better with hemodialysis overnight and hyperkalemia corrected. The plan is to undertake an additional dialysis treatment today prior to discharge later on. 2 ESRD:  he has now been accepted to a new dialysis center on probation in [place], [place] and understands his commitment to complying with dialysis treatments/medications. Underwent hemodialysis yesterday without problems-additional hemodialysis to be undertaken today. 3 Hypertension:  blood pressure slightly better with hemodialysis/ultrafiltration-monitor with additional ultrafiltration today. 4. Anemia of ESRD:  low hemoglobin noted, likely due to inability to get ESA with missed hemodialysis treatments. Aranesp given yesterday- improvement of hemoglobin likely to be from hemoconcentration. 5. Metabolic Bone Disease:  continue Renvela for phosphorus binding as well as renal diet while here in the hospital.  6.  Chronic pain-is a complicating issue.  Is followed by Dr. Hermelinda Medicus as an outpatient and is on chronic narcotics  Subjective:   Reports to be feeling better after dialysis yesterday and expresses regrets over past non-compliance. He is willing to work hard and adhere to HD in the future.     Objective:   BP (!) 145/61 (BP Location: Left Arm)   Pulse (!) 111   Temp 99.5 F (37.5 C) (Oral)   Resp 19   Ht 5\' 6"  (1.676 m)   Wt 91.8 kg (202 lb 6.4 oz)   SpO2 100%   BMI 32.67 kg/m   Physical Exam: Gen: Comfortably resting in bed, not in discomfort/distress CVS: Pulse regular tachycardia, S1 and S2 normal Resp: Fine rales over right base otherwise clear to auscultation  Abd: Soft, obese, nontender, bowel sounds normal Ext: 1+ lower extremity edema bilaterally, left IJ TDC,  right BCF with recent angiogram sheath scar-fistula has fair thrill  Labs: BMET Recent Labs  Lab 04/23/17 1430 04/23/17 1809 04/24/17 0727 04/25/17 0511  NA 138 137 135 137  K 5.5* 5.7* 6.2* 4.5  CL 106 103 104 98*  CO2  --  17* 14* 24  GLUCOSE 118* 122* 95 86  BUN 86* 86* 90* 41*  CREATININE 16.90* 15.84* 16.50* 9.17*  CALCIUM  --  7.5* 7.5* 8.0*   CBC Recent Labs  Lab 04/23/17 1430 04/23/17 1515 04/24/17 0727 04/25/17 0822  WBC  --  8.4 9.6 9.3  HGB 8.8* 8.9* 8.0* 8.9*  HCT 26.0* 27.4* 24.4* 27.4*  MCV  --  90.7 92.1 91.6  PLT  --  127* 121* 125*   Medications:    . amLODipine  10 mg Oral QHS  . darbepoetin (ARANESP) injection - DIALYSIS  150 mcg Intravenous Q Tue-HD  . sevelamer carbonate  1,600 mg Oral TID WC  . sodium chloride flush  3 mL Intravenous Q12H   04/27/17, MD 04/25/2017, 9:25 AM

## 2017-04-26 LAB — BASIC METABOLIC PANEL
ANION GAP: 14 (ref 5–15)
BUN: 29 mg/dL — AB (ref 6–20)
CHLORIDE: 99 mmol/L — AB (ref 101–111)
CO2: 24 mmol/L (ref 22–32)
Calcium: 8 mg/dL — ABNORMAL LOW (ref 8.9–10.3)
Creatinine, Ser: 7.28 mg/dL — ABNORMAL HIGH (ref 0.61–1.24)
GFR calc Af Amer: 10 mL/min — ABNORMAL LOW (ref >60)
GFR, EST NON AFRICAN AMERICAN: 8 mL/min — AB (ref >60)
GLUCOSE: 77 mg/dL (ref 65–99)
POTASSIUM: 4.8 mmol/L (ref 3.5–5.1)
Sodium: 137 mmol/L (ref 135–145)

## 2017-04-26 LAB — CBC
HEMATOCRIT: 26 % — AB (ref 39.0–52.0)
Hemoglobin: 8.4 g/dL — ABNORMAL LOW (ref 13.0–17.0)
MCH: 29.3 pg (ref 26.0–34.0)
MCHC: 32.3 g/dL (ref 30.0–36.0)
MCV: 90.6 fL (ref 78.0–100.0)
PLATELETS: 119 10*3/uL — AB (ref 150–400)
RBC: 2.87 MIL/uL — AB (ref 4.22–5.81)
RDW: 15 % (ref 11.5–15.5)
WBC: 6 10*3/uL (ref 4.0–10.5)

## 2017-04-26 MED ORDER — LEVOFLOXACIN 500 MG PO TABS: 500.0000 mg | ORAL_TABLET | ORAL | Status: DC

## 2017-04-26 MED ORDER — AMLODIPINE BESYLATE 10 MG PO TABS: 10.0000 mg | ORAL_TABLET | Freq: Every day | ORAL | 0 refills | Status: AC

## 2017-04-26 MED ORDER — LEVOFLOXACIN 500 MG PO TABS: 500.0000 mg | ORAL_TABLET | ORAL | 0 refills | Status: AC

## 2017-04-26 MED ORDER — LEVOFLOXACIN 750 MG PO TABS
750.0000 mg | ORAL_TABLET | Freq: Once | ORAL | Status: AC
  Administered 2017-04-26: 750 mg via ORAL
  Filled 2017-04-26: qty 1

## 2017-04-26 NOTE — Discharge Summary (Signed)
Physician Discharge Summary  James Kim IFO:[id number] DOB: 1976/04/17 DOA: 04/23/2017  PCP: Beatriz Stallion, MD  Admit date: 04/23/2017 Discharge date: 04/26/2017  Admitted From: Home Disposition:  Home  Recommendations for Outpatient Follow-up:  1. Follow up with PCP in 1 week 2. Follow up with Dialysis as scheduled 3. Please follow up on the following pending results: final sputum culture results  4. Followup PA and lateral chest X-ray is recommended in 3-4 weeks following trial of antibiotic therapy to ensure resolution and exclude underlying malignancy.  Discharge Condition: Stable CODE STATUS: Full  Diet recommendation: Renal diet   Brief/Interim Summary: James Kras Collinsis a 41 y.o.malewith medical history significant ofESRD who comes in after not having dialysis for a week due to transportation issues with his dialysis unit. Pt reports he is in the process of being set up at a closer dialysis place for him. He cannot make it to his current one because it is too far away from where he lives. He was found to have pulm edema with normal O2 sats. Nephro was consulted.   Patient underwent dialysis on 12/18, 12/19 with improvement in volume overload status.  He developed a new fever of 101.4 on morning of 12/19.  Chest x-ray revealed right-sided pneumonia.  He was started on IV cefepime.  Influenza screening negative.  He remained afebrile and was transitioned to oral Levaquin, renally dosed.  He is discharged home with close follow-up with outpatient dialysis center.  Discharge Diagnoses:  Principal Problem:   Dialysis patient [facility]) Active Problems:   Essential hypertension   Noncompliance   Anemia of chronic disease   Acute pulmonary edema (HCC)   ESRD on HD with volume overload and hyperkalemia -Due to missing HD over the past week -Nephrology consulted. HD 12/18 and again 12/19. Will follow up with outpatient HD center   HCAP -New fever of 101.4 on  12/19 . CXR ordered which revealed right sided pneumonia. Patient has been coughing white/clear phlegm and states he felt horrible all night. Influenza negative. MRSA PCR negative.   Started on IV cefepime, has remained afebrile.  Discharged home with oral Levaquin, renally dosed   Medical noncompliance -Hopefully with new dialysis center, it wont be an issue going forward  HTN -Continue norvasc, lopressor   Anemia of chronic kidney disease -Stable     Discharge Instructions  Discharge Instructions    Call MD for:  difficulty breathing, headache or visual disturbances   Complete by:  As directed    Call MD for:  extreme fatigue   Complete by:  As directed    Call MD for:  hives   Complete by:  As directed    Call MD for:  persistant dizziness or light-headedness   Complete by:  As directed    Call MD for:  persistant nausea and vomiting   Complete by:  As directed    Call MD for:  severe uncontrolled pain   Complete by:  As directed    Call MD for:  temperature >100.4   Complete by:  As directed    Discharge instructions   Complete by:  As directed    You were cared for by a hospitalist during your hospital stay. If you have any questions about your discharge medications or the care you received while you were in the hospital after you are discharged, you can call the unit and asked to speak with the hospitalist on call if the hospitalist that took care of you is  not available. Once you are discharged, your primary care physician will handle any further medical issues. Please note that NO REFILLS for any discharge medications will be authorized once you are discharged, as it is imperative that you return to your primary care physician (or establish a relationship with a primary care physician if you do not have one) for your aftercare needs so that they can reassess your need for medications and monitor your lab values.   Increase activity slowly   Complete by:  As directed       Allergies as of 04/26/2017      Reactions   Sulfa Antibiotics Anaphylaxis, Itching, Rash      Medication List    STOP taking these medications   DULoxetine 20 MG capsule Commonly known as:  CYMBALTA   furosemide 20 MG tablet Commonly known as:  LASIX   gentamicin 1.6-0.9 MG/ML-% Commonly known as:  GARAMYCIN     TAKE these medications   allopurinol 100 MG tablet Commonly known as:  ZYLOPRIM Take 1 tablet (100 mg total) by mouth every other day. What changed:  when to take this   amLODipine 10 MG tablet Commonly known as:  NORVASC Take 1 tablet (10 mg total) by mouth at bedtime.   clonazePAM 1 MG tablet Commonly known as:  KLONOPIN Take 1 tablet (1 mg total) 2 (two) times daily as needed by mouth for anxiety.   levofloxacin 500 MG tablet Commonly known as:  LEVAQUIN Take 1 tablet (500 mg total) by mouth every other day for 4 days. Take on 12/22 and then again on 12/24 Start taking on:  04/28/2017   metoprolol tartrate 100 MG tablet Commonly known as:  LOPRESSOR Take 100 mg by mouth at bedtime.   oxyCODONE-acetaminophen 10-325 MG tablet Commonly known as:  PERCOCET Take 1 tablet 5 (five) times daily as needed by mouth. For pain.   OXYCONTIN 30 MG 12 hr tablet Generic drug:  oxyCODONE Take 1 tablet (30 mg total) every 12 (twelve) hours by mouth.   pantoprazole 40 MG tablet Commonly known as:  PROTONIX Take 40 mg by mouth at bedtime.   pregabalin 25 MG capsule Commonly known as:  LYRICA Take 1 capsule (25 mg total) by mouth daily. What changed:    when to take this  reasons to take this   promethazine 12.5 MG tablet Commonly known as:  PHENERGAN Take 12.5 mg by mouth at bedtime as needed for nausea or vomiting.   sevelamer carbonate 800 MG tablet Commonly known as:  RENVELA Take 2 tablets (1,600 mg total) by mouth 3 (three) times daily with meals.   Vitamin D (Ergocalciferol) 50000 units Caps capsule Commonly known as:  DRISDOL Take 50,000 Units  by mouth every Wednesday.      Follow-up Information    Beatriz Stallion, MD. Schedule an appointment as soon as possible for a visit in 1 week(s).   Specialty:  Internal Medicine         Allergies  Allergen Reactions  . Sulfa Antibiotics Anaphylaxis, Itching and Rash    Consultations:  Nephrology    Procedures/Studies: Dg Chest 2 View  Result Date: 04/23/2017 CLINICAL DATA:  Needs dialysis.  Shortness of breath EXAM: CHEST  2 VIEW COMPARISON:  11/15/2016 FINDINGS: Dialysis catheter on the left with tip at the upper cavoatrial junction. Chronic cardiomegaly. Diffuse vascular congestion with interstitial coarsening. No effusion or pneumothorax. No air bronchogram. IMPRESSION: Pulmonary edema and chronic cardiomegaly. Electronically Signed   By: Marja Kays  Watts M.D.   On: 04/23/2017 14:40   Dg Chest Port 1 View  Result Date: 04/25/2017 CLINICAL DATA:  Fever, pulmonary edema. EXAM: PORTABLE CHEST 1 VIEW COMPARISON:  Radiographs of April 23, 2017. FINDINGS: Stable cardiomediastinal silhouette. No pneumothorax is noted. Left-sided dialysis catheter is unchanged in position. Left lung is clear. Patchy opacities are noted throughout the right lung which may represent some degree of pneumonia. Bony thorax is unremarkable. IMPRESSION: Patchy opacities are noted throughout the right lung concerning for possible pneumonia or possibly edema. Followup PA and lateral chest X-ray is recommended in 3-4 weeks following trial of antibiotic therapy to ensure resolution and exclude underlying malignancy. Electronically Signed   By: Lupita Raider, M.D.   On: 04/25/2017 09:29       Discharge Exam: Vitals:   04/25/17 2002 04/26/17 0450  BP: (!) 142/84 124/70  Pulse: (!) 106 87  Resp: 16 16  Temp: 98.4 F (36.9 C) 98.3 F (36.8 C)  SpO2: 100% 100%   Vitals:   04/25/17 1630 04/25/17 1847 04/25/17 2002 04/26/17 0450  BP: (!) 159/89 139/88 (!) 142/84 124/70  Pulse: (!) 103 (!) 116 (!)  106 87  Resp: 18 18 16 16   Temp: 98.3 F (36.8 C) 98 F (36.7 C) 98.4 F (36.9 C) 98.3 F (36.8 C)  TempSrc: Oral Oral    SpO2: 100% 99% 100% 100%  Weight: 88.6 kg (195 lb 5.2 oz)  88.1 kg (194 lb 3.6 oz)   Height:        General: Pt is alert, awake, not in acute distress Cardiovascular: RRR, S1/S2 +, no rubs, no gallops Respiratory: CTA bilaterally, no wheezing, no rhonchi Abdominal: Soft, NT, ND, bowel sounds + Extremities: no edema, no cyanosis    The results of significant diagnostics from this hospitalization (including imaging, microbiology, ancillary and laboratory) are listed below for reference.     Microbiology: Recent Results (from the past 240 hour(s))  MRSA PCR Screening     Status: None   Collection Time: 04/23/17 11:17 PM  Result Value Ref Range Status   MRSA by PCR NEGATIVE NEGATIVE Final    Comment:        The GeneXpert MRSA Assay (FDA approved for NASAL specimens only), is one component of a comprehensive MRSA colonization surveillance program. It is not intended to diagnose MRSA infection nor to guide or monitor treatment for MRSA infections.   Culture, sputum-assessment     Status: None   Collection Time: 04/25/17  5:17 PM  Result Value Ref Range Status   Specimen Description EXPECTORATED SPUTUM  Final   Special Requests NONE  Final   Sputum evaluation THIS SPECIMEN IS ACCEPTABLE FOR SPUTUM CULTURE  Final   Report Status 04/25/2017 FINAL  Final  Culture, respiratory (NON-Expectorated)     Status: None (Preliminary result)   Collection Time: 04/25/17  5:17 PM  Result Value Ref Range Status   Specimen Description EXPECTORATED SPUTUM  Final   Special Requests NONE Reflexed from [id number]  Final   Gram Stain   Final    RARE WBC PRESENT, PREDOMINANTLY PMN ABUNDANT GRAM POSITIVE RODS MODERATE GRAM NEGATIVE COCCOBACILLI MODERATE GRAM POSITIVE COCCI IN PAIRS IN CLUSTERS    Culture PENDING  Incomplete   Report Status PENDING  Incomplete      Labs: BNP (last 3 results) Recent Labs    04/23/17 1409  BNP 836.9*   Basic Metabolic Panel: Recent Labs  Lab 04/23/17 1430 04/23/17 1809 04/24/17 0727 04/25/17 0511 04/26/17 6195  NA 138 137 135 137 137  K 5.5* 5.7* 6.2* 4.5 4.8  CL 106 103 104 98* 99*  CO2  --  17* 14* 24 24  GLUCOSE 118* 122* 95 86 77  BUN 86* 86* 90* 41* 29*  CREATININE 16.90* 15.84* 16.50* 9.17* 7.28*  CALCIUM  --  7.5* 7.5* 8.0* 8.0*   Liver Function Tests: No results for input(s): AST, ALT, ALKPHOS, BILITOT, PROT, ALBUMIN in the last 168 hours. No results for input(s): LIPASE, AMYLASE in the last 168 hours. No results for input(s): AMMONIA in the last 168 hours. CBC: Recent Labs  Lab 04/23/17 1430 04/23/17 1515 04/24/17 0727 04/25/17 0822 04/26/17 0613  WBC  --  8.4 9.6 9.3 6.0  HGB 8.8* 8.9* 8.0* 8.9* 8.4*  HCT 26.0* 27.4* 24.4* 27.4* 26.0*  MCV  --  90.7 92.1 91.6 90.6  PLT  --  127* 121* 125* 119*   Cardiac Enzymes: No results for input(s): CKTOTAL, CKMB, CKMBINDEX, TROPONINI in the last 168 hours. BNP: Invalid input(s): POCBNP CBG: No results for input(s): GLUCAP in the last 168 hours. D-Dimer No results for input(s): DDIMER in the last 72 hours. Hgb A1c No results for input(s): HGBA1C in the last 72 hours. Lipid Profile No results for input(s): CHOL, HDL, LDLCALC, TRIG, CHOLHDL, LDLDIRECT in the last 72 hours. Thyroid function studies No results for input(s): TSH, T4TOTAL, T3FREE, THYROIDAB in the last 72 hours.  Invalid input(s): FREET3 Anemia work up No results for input(s): VITAMINB12, FOLATE, FERRITIN, TIBC, IRON, RETICCTPCT in the last 72 hours. Urinalysis    Component Value Date/Time   COLORURINE YELLOW 11/15/2016 1452   APPEARANCEUR CLEAR 11/15/2016 1452   LABSPEC 1.011 11/15/2016 1452   PHURINE 7.0 11/15/2016 1452   GLUCOSEU 50 (A) 11/15/2016 1452   HGBUR SMALL (A) 11/15/2016 1452   BILIRUBINUR NEGATIVE 11/15/2016 1452   KETONESUR NEGATIVE 11/15/2016 1452    PROTEINUR >=300 (A) 11/15/2016 1452   NITRITE NEGATIVE 11/15/2016 1452   LEUKOCYTESUR NEGATIVE 11/15/2016 1452   Sepsis Labs Invalid input(s): PROCALCITONIN,  WBC,  LACTICIDVEN Microbiology Recent Results (from the past 240 hour(s))  MRSA PCR Screening     Status: None   Collection Time: 04/23/17 11:17 PM  Result Value Ref Range Status   MRSA by PCR NEGATIVE NEGATIVE Final    Comment:        The GeneXpert MRSA Assay (FDA approved for NASAL specimens only), is one component of a comprehensive MRSA colonization surveillance program. It is not intended to diagnose MRSA infection nor to guide or monitor treatment for MRSA infections.   Culture, sputum-assessment     Status: None   Collection Time: 04/25/17  5:17 PM  Result Value Ref Range Status   Specimen Description EXPECTORATED SPUTUM  Final   Special Requests NONE  Final   Sputum evaluation THIS SPECIMEN IS ACCEPTABLE FOR SPUTUM CULTURE  Final   Report Status 04/25/2017 FINAL  Final  Culture, respiratory (NON-Expectorated)     Status: None (Preliminary result)   Collection Time: 04/25/17  5:17 PM  Result Value Ref Range Status   Specimen Description EXPECTORATED SPUTUM  Final   Special Requests NONE Reflexed from [id number]  Final   Gram Stain   Final    RARE WBC PRESENT, PREDOMINANTLY PMN ABUNDANT GRAM POSITIVE RODS MODERATE GRAM NEGATIVE COCCOBACILLI MODERATE GRAM POSITIVE COCCI IN PAIRS IN CLUSTERS    Culture PENDING  Incomplete   Report Status PENDING  Incomplete     Time coordinating discharge: 30 minutes  SIGNED:  Noralee Stain, DO [facility] Pager [phone]  If 7PM-7AM, please contact night-coverage [email or web address] Password [facility] 04/26/2017, 12:57 PM

## 2017-04-26 NOTE — Progress Notes (Signed)
Pharmacy Antibiotic Note  James Kim is a 41 y.o. male admitted on 04/23/2017 with SOB concerning for PNA. Pharmacy has been consulted to transition Cefepime to Levaquin for possible discharge today.  The patient's last dose of Cefepime was 12/19 evening. ESRD  Plan: 1. Levaquin 750 mg po x 1 followed by 500 mg po every 48 hours 2. Will continue to follow HD schedule/duration, culture results, LOT, and antibiotic de-escalation plans   Height: 5\' 6"  (167.6 cm) Weight: 194 lb 3.6 oz (88.1 kg) IBW/kg (Calculated) : 63.8  Temp (24hrs), Avg:98.6 F (37 C), Min:98 F (36.7 C), Max:99.5 F (37.5 C)  Recent Labs  Lab 04/23/17 1430 04/23/17 1515 04/23/17 1809 04/24/17 0727 04/25/17 0511 04/25/17 0822 04/26/17 0613  WBC  --  8.4  --  9.6  --  9.3 6.0  CREATININE 16.90*  --  15.84* 16.50* 9.17*  --  7.28*    Estimated Creatinine Clearance: 14.2 mL/min (A) (by C-G formula based on SCr of 7.28 mg/dL (H)).    Allergies  Allergen Reactions  . Sulfa Antibiotics Anaphylaxis, Itching and Rash    Antimicrobials this admission: Cefepime 12/19 >> 12/20 Levaquin 12/20 >>  Dose adjustments this admission: n/a  Microbiology results: 12/17 MRSA PCR >> neg 12/19 RCx >> pending  Thank you for allowing pharmacy to be a part of this patient's care.  1/20, PharmD, BCPS Clinical Pharmacist Pager: [phone] Clinical phone for 04/26/2017 from 7a-3:30p: [phone] If after 3:30p, please call main pharmacy at: [phone] 04/26/2017 8:21 AM

## 2017-04-27 ENCOUNTER — Other Ambulatory Visit: Payer: Self-pay | Admitting: Physical Medicine & Rehabilitation

## 2017-04-27 ENCOUNTER — Telehealth: Payer: Self-pay | Admitting: Registered Nurse

## 2017-04-27 DIAGNOSIS — M722 Plantar fascial fibromatosis: Secondary | ICD-10-CM

## 2017-04-27 DIAGNOSIS — Z79899 Other long term (current) drug therapy: Secondary | ICD-10-CM

## 2017-04-27 DIAGNOSIS — F329 Major depressive disorder, single episode, unspecified: Secondary | ICD-10-CM

## 2017-04-27 DIAGNOSIS — G894 Chronic pain syndrome: Secondary | ICD-10-CM

## 2017-04-27 MED ORDER — CLONAZEPAM 1 MG PO TABS: 1.0000 mg | ORAL_TABLET | Freq: Two times a day (BID) | ORAL | 2 refills | Status: AC | PRN

## 2017-04-27 MED ORDER — OXYCODONE-ACETAMINOPHEN 10-325 MG PO TABS: 1.0000 | ORAL_TABLET | Freq: Every day | ORAL | 0 refills | Status: DC | PRN

## 2017-04-27 MED ORDER — OXYCONTIN 30 MG PO T12A: 30.0000 mg | EXTENDED_RELEASE_TABLET | Freq: Two times a day (BID) | ORAL | 0 refills | Status: DC

## 2017-04-27 NOTE — Telephone Encounter (Signed)
[name] called,  He was discharged from hospital on 04/26/17, his wife was instructed to come to office to pick up his prescriptions. According to the PMP Aware Site Oxycontin, Oxycodone and Klonopin was picked up on 03/26/17. He was scheduled for an appointment in January 16th at 11:20 am, with an arrival time at 11:00. He verbalizes understanding.

## 2017-04-28 DIAGNOSIS — D631 Anemia in chronic kidney disease: Secondary | ICD-10-CM | POA: Diagnosis not present

## 2017-04-28 DIAGNOSIS — N186 End stage renal disease: Secondary | ICD-10-CM | POA: Diagnosis not present

## 2017-04-28 DIAGNOSIS — D500 Iron deficiency anemia secondary to blood loss (chronic): Secondary | ICD-10-CM | POA: Diagnosis not present

## 2017-04-28 DIAGNOSIS — N2581 Secondary hyperparathyroidism of renal origin: Secondary | ICD-10-CM | POA: Diagnosis not present

## 2017-04-28 LAB — CULTURE, RESPIRATORY W GRAM STAIN: Culture: NORMAL

## 2017-04-28 LAB — CULTURE, RESPIRATORY

## 2017-04-30 DIAGNOSIS — E8770 Fluid overload, unspecified: Secondary | ICD-10-CM | POA: Diagnosis not present

## 2017-04-30 DIAGNOSIS — I120 Hypertensive chronic kidney disease with stage 5 chronic kidney disease or end stage renal disease: Secondary | ICD-10-CM | POA: Diagnosis present

## 2017-04-30 DIAGNOSIS — R0902 Hypoxemia: Secondary | ICD-10-CM | POA: Diagnosis not present

## 2017-04-30 DIAGNOSIS — F329 Major depressive disorder, single episode, unspecified: Secondary | ICD-10-CM | POA: Diagnosis present

## 2017-04-30 DIAGNOSIS — D72829 Elevated white blood cell count, unspecified: Secondary | ICD-10-CM | POA: Diagnosis not present

## 2017-04-30 DIAGNOSIS — E875 Hyperkalemia: Secondary | ICD-10-CM | POA: Diagnosis not present

## 2017-04-30 DIAGNOSIS — R471 Dysarthria and anarthria: Secondary | ICD-10-CM | POA: Diagnosis present

## 2017-04-30 DIAGNOSIS — E559 Vitamin D deficiency, unspecified: Secondary | ICD-10-CM | POA: Diagnosis present

## 2017-04-30 DIAGNOSIS — F419 Anxiety disorder, unspecified: Secondary | ICD-10-CM | POA: Diagnosis present

## 2017-04-30 DIAGNOSIS — D649 Anemia, unspecified: Secondary | ICD-10-CM | POA: Diagnosis not present

## 2017-04-30 DIAGNOSIS — M069 Rheumatoid arthritis, unspecified: Secondary | ICD-10-CM | POA: Diagnosis present

## 2017-04-30 DIAGNOSIS — R4182 Altered mental status, unspecified: Secondary | ICD-10-CM | POA: Diagnosis not present

## 2017-04-30 DIAGNOSIS — J9600 Acute respiratory failure, unspecified whether with hypoxia or hypercapnia: Secondary | ICD-10-CM | POA: Diagnosis not present

## 2017-04-30 DIAGNOSIS — G894 Chronic pain syndrome: Secondary | ICD-10-CM | POA: Diagnosis present

## 2017-04-30 DIAGNOSIS — I10 Essential (primary) hypertension: Secondary | ICD-10-CM | POA: Diagnosis not present

## 2017-04-30 DIAGNOSIS — E440 Moderate protein-calorie malnutrition: Secondary | ICD-10-CM | POA: Diagnosis not present

## 2017-04-30 DIAGNOSIS — A419 Sepsis, unspecified organism: Secondary | ICD-10-CM | POA: Diagnosis not present

## 2017-04-30 DIAGNOSIS — R739 Hyperglycemia, unspecified: Secondary | ICD-10-CM | POA: Diagnosis present

## 2017-04-30 DIAGNOSIS — G9341 Metabolic encephalopathy: Secondary | ICD-10-CM | POA: Diagnosis not present

## 2017-04-30 DIAGNOSIS — K7290 Hepatic failure, unspecified without coma: Secondary | ICD-10-CM | POA: Diagnosis present

## 2017-04-30 DIAGNOSIS — J9601 Acute respiratory failure with hypoxia: Secondary | ICD-10-CM | POA: Diagnosis not present

## 2017-04-30 DIAGNOSIS — R0689 Other abnormalities of breathing: Secondary | ICD-10-CM | POA: Diagnosis not present

## 2017-04-30 DIAGNOSIS — G4733 Obstructive sleep apnea (adult) (pediatric): Secondary | ICD-10-CM | POA: Diagnosis present

## 2017-04-30 DIAGNOSIS — J811 Chronic pulmonary edema: Secondary | ICD-10-CM | POA: Diagnosis not present

## 2017-04-30 DIAGNOSIS — E7220 Disorder of urea cycle metabolism, unspecified: Secondary | ICD-10-CM | POA: Diagnosis not present

## 2017-04-30 DIAGNOSIS — N186 End stage renal disease: Secondary | ICD-10-CM | POA: Diagnosis not present

## 2017-04-30 DIAGNOSIS — T507X5A Adverse effect of analeptics and opioid receptor antagonists, initial encounter: Secondary | ICD-10-CM | POA: Diagnosis present

## 2017-04-30 DIAGNOSIS — G629 Polyneuropathy, unspecified: Secondary | ICD-10-CM | POA: Diagnosis present

## 2017-04-30 DIAGNOSIS — Z9911 Dependence on respirator [ventilator] status: Secondary | ICD-10-CM | POA: Diagnosis not present

## 2017-04-30 DIAGNOSIS — E871 Hypo-osmolality and hyponatremia: Secondary | ICD-10-CM | POA: Diagnosis not present

## 2017-04-30 DIAGNOSIS — J189 Pneumonia, unspecified organism: Secondary | ICD-10-CM | POA: Diagnosis not present

## 2017-04-30 DIAGNOSIS — I509 Heart failure, unspecified: Secondary | ICD-10-CM | POA: Diagnosis not present

## 2017-04-30 DIAGNOSIS — R402441 Other coma, without documented Glasgow coma scale score, or with partial score reported, in the field [EMT or ambulance]: Secondary | ICD-10-CM | POA: Diagnosis not present

## 2017-04-30 DIAGNOSIS — R4781 Slurred speech: Secondary | ICD-10-CM | POA: Diagnosis not present

## 2017-04-30 DIAGNOSIS — E785 Hyperlipidemia, unspecified: Secondary | ICD-10-CM | POA: Diagnosis present

## 2017-04-30 DIAGNOSIS — D631 Anemia in chronic kidney disease: Secondary | ICD-10-CM | POA: Diagnosis present

## 2017-04-30 DIAGNOSIS — J810 Acute pulmonary edema: Secondary | ICD-10-CM | POA: Diagnosis not present

## 2017-04-30 DIAGNOSIS — E8779 Other fluid overload: Secondary | ICD-10-CM | POA: Diagnosis not present

## 2017-05-07 DIAGNOSIS — Z992 Dependence on renal dialysis: Secondary | ICD-10-CM | POA: Diagnosis not present

## 2017-05-07 DIAGNOSIS — N186 End stage renal disease: Secondary | ICD-10-CM | POA: Diagnosis not present

## 2017-05-09 DIAGNOSIS — Z9181 History of falling: Secondary | ICD-10-CM | POA: Diagnosis not present

## 2017-05-09 DIAGNOSIS — I10 Essential (primary) hypertension: Secondary | ICD-10-CM | POA: Diagnosis not present

## 2017-05-09 DIAGNOSIS — G4733 Obstructive sleep apnea (adult) (pediatric): Secondary | ICD-10-CM | POA: Diagnosis not present

## 2017-05-10 DIAGNOSIS — D631 Anemia in chronic kidney disease: Secondary | ICD-10-CM | POA: Diagnosis not present

## 2017-05-10 DIAGNOSIS — N186 End stage renal disease: Secondary | ICD-10-CM | POA: Diagnosis not present

## 2017-05-10 DIAGNOSIS — D509 Iron deficiency anemia, unspecified: Secondary | ICD-10-CM | POA: Diagnosis not present

## 2017-05-12 DIAGNOSIS — D509 Iron deficiency anemia, unspecified: Secondary | ICD-10-CM | POA: Diagnosis not present

## 2017-05-12 DIAGNOSIS — N186 End stage renal disease: Secondary | ICD-10-CM | POA: Diagnosis not present

## 2017-05-12 DIAGNOSIS — D631 Anemia in chronic kidney disease: Secondary | ICD-10-CM | POA: Diagnosis not present

## 2017-05-14 DIAGNOSIS — N186 End stage renal disease: Secondary | ICD-10-CM | POA: Diagnosis not present

## 2017-05-15 DIAGNOSIS — D509 Iron deficiency anemia, unspecified: Secondary | ICD-10-CM | POA: Diagnosis not present

## 2017-05-15 DIAGNOSIS — D631 Anemia in chronic kidney disease: Secondary | ICD-10-CM | POA: Diagnosis not present

## 2017-05-15 DIAGNOSIS — N186 End stage renal disease: Secondary | ICD-10-CM | POA: Diagnosis not present

## 2017-05-17 DIAGNOSIS — D631 Anemia in chronic kidney disease: Secondary | ICD-10-CM | POA: Diagnosis not present

## 2017-05-17 DIAGNOSIS — D509 Iron deficiency anemia, unspecified: Secondary | ICD-10-CM | POA: Diagnosis not present

## 2017-05-17 DIAGNOSIS — N186 End stage renal disease: Secondary | ICD-10-CM | POA: Diagnosis not present

## 2017-05-19 DIAGNOSIS — D509 Iron deficiency anemia, unspecified: Secondary | ICD-10-CM | POA: Diagnosis not present

## 2017-05-19 DIAGNOSIS — N186 End stage renal disease: Secondary | ICD-10-CM | POA: Diagnosis not present

## 2017-05-19 DIAGNOSIS — D631 Anemia in chronic kidney disease: Secondary | ICD-10-CM | POA: Diagnosis not present

## 2017-05-23 ENCOUNTER — Encounter: Payer: Medicare Other | Admitting: Registered Nurse

## 2017-05-24 DIAGNOSIS — N186 End stage renal disease: Secondary | ICD-10-CM | POA: Diagnosis not present

## 2017-05-24 DIAGNOSIS — D631 Anemia in chronic kidney disease: Secondary | ICD-10-CM | POA: Diagnosis not present

## 2017-05-24 DIAGNOSIS — D509 Iron deficiency anemia, unspecified: Secondary | ICD-10-CM | POA: Diagnosis not present

## 2017-05-26 DIAGNOSIS — D631 Anemia in chronic kidney disease: Secondary | ICD-10-CM | POA: Diagnosis not present

## 2017-05-26 DIAGNOSIS — N186 End stage renal disease: Secondary | ICD-10-CM | POA: Diagnosis not present

## 2017-05-26 DIAGNOSIS — D509 Iron deficiency anemia, unspecified: Secondary | ICD-10-CM | POA: Diagnosis not present

## 2017-05-27 ENCOUNTER — Encounter (HOSPITAL_COMMUNITY): Payer: Self-pay | Admitting: Emergency Medicine

## 2017-05-27 ENCOUNTER — Other Ambulatory Visit: Payer: Self-pay

## 2017-05-27 ENCOUNTER — Emergency Department (HOSPITAL_COMMUNITY)
Admission: EM | Admit: 2017-05-27 | Discharge: 2017-05-27 | Disposition: A | Discharge status: Home / Self Care | Payer: Medicare Other | Attending: Emergency Medicine | Admitting: Emergency Medicine

## 2017-05-27 DIAGNOSIS — I120 Hypertensive chronic kidney disease with stage 5 chronic kidney disease or end stage renal disease: Secondary | ICD-10-CM | POA: Diagnosis not present

## 2017-05-27 DIAGNOSIS — Z992 Dependence on renal dialysis: Secondary | ICD-10-CM | POA: Insufficient documentation

## 2017-05-27 DIAGNOSIS — R202 Paresthesia of skin: Secondary | ICD-10-CM | POA: Insufficient documentation

## 2017-05-27 DIAGNOSIS — R5383 Other fatigue: Secondary | ICD-10-CM | POA: Diagnosis not present

## 2017-05-27 DIAGNOSIS — E875 Hyperkalemia: Secondary | ICD-10-CM | POA: Insufficient documentation

## 2017-05-27 DIAGNOSIS — N186 End stage renal disease: Secondary | ICD-10-CM | POA: Diagnosis not present

## 2017-05-27 DIAGNOSIS — Z79899 Other long term (current) drug therapy: Secondary | ICD-10-CM | POA: Insufficient documentation

## 2017-05-27 DIAGNOSIS — I10 Essential (primary) hypertension: Secondary | ICD-10-CM | POA: Diagnosis not present

## 2017-05-27 DIAGNOSIS — F17220 Nicotine dependence, chewing tobacco, uncomplicated: Secondary | ICD-10-CM | POA: Insufficient documentation

## 2017-05-27 LAB — CBC
HCT: 40 % (ref 39.0–52.0)
Hemoglobin: 12.3 g/dL — ABNORMAL LOW (ref 13.0–17.0)
MCH: 29.7 pg (ref 26.0–34.0)
MCHC: 30.8 g/dL (ref 30.0–36.0)
MCV: 96.6 fL (ref 78.0–100.0)
PLATELETS: 127 10*3/uL — AB (ref 150–400)
RBC: 4.14 MIL/uL — ABNORMAL LOW (ref 4.22–5.81)
RDW: 14.1 % (ref 11.5–15.5)
WBC: 5.9 10*3/uL (ref 4.0–10.5)

## 2017-05-27 LAB — BASIC METABOLIC PANEL
Anion gap: 14 (ref 5–15)
BUN: 35 mg/dL — AB (ref 6–20)
CALCIUM: 8.8 mg/dL — AB (ref 8.9–10.3)
CHLORIDE: 101 mmol/L (ref 101–111)
CO2: 24 mmol/L (ref 22–32)
CREATININE: 8.2 mg/dL — AB (ref 0.61–1.24)
GFR calc Af Amer: 8 mL/min — ABNORMAL LOW (ref >60)
GFR, EST NON AFRICAN AMERICAN: 7 mL/min — AB (ref >60)
Glucose, Bld: 100 mg/dL — ABNORMAL HIGH (ref 65–99)
Potassium: 6.3 mmol/L (ref 3.5–5.1)
SODIUM: 139 mmol/L (ref 135–145)

## 2017-05-27 MED ORDER — SODIUM POLYSTYRENE SULFONATE 15 GM/60ML PO SUSP
30.0000 g | Freq: Once | ORAL | Status: AC
  Administered 2017-05-27: 30 g via ORAL
  Filled 2017-05-27: qty 120

## 2017-05-27 NOTE — ED Notes (Signed)
Date and time results received: 05/27/17 1500 (use smartphrase ".now" to insert current time)  Test: Potassium  Critical Value: 6.3  Name of Provider Notified: Dr. Lynelle Doctor  Orders Received? Or Actions Taken?:ED EKG verbal order

## 2017-05-27 NOTE — ED Provider Notes (Signed)
[facility] EMERGENCY DEPARTMENT Provider Note   CSN: [id number] Arrival date & time: 05/27/17  1343     History   Chief Complaint Chief Complaint  Patient presents with  . Numbness    HPI James Kim is a 42 y.o. male.  HPI Pt has been having trouble with tinging in his arms and legs bilaterally.  He feels it going down the arms and legs on both sides.  It started 6-7 months.  He was concenred it could be related to the fistula in his right arm tht was placed 10 months ago.  No fevers.  No vomiting.  He does feel like he has fatigue and decreased energy.  Pt is walking without trouble.  History of chronic renal failure.  He was last dialyzed yesterday.  Pt goes to dialysis in [place]. Past Medical History:  Diagnosis Date  . Anxiety   . Arthritis    "from my knees down" (08/08/2016)- RA  . Childhood asthma   . 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 Dialysis-associated peritonitis (HCC) 08/08/2016  . Eczema   . ESRD on peritoneal dialysis (HCC)    "01/29/2014-08/07/2016" (08/08/2016)  . GERD (gastroesophageal reflux disease)   . Heel spur    "both feet" (08/08/2016)  . Hypertension   . Noncompliance   . OSA on CPAP   . Pain management    "for pain BLE; knees down thru feet" (08/08/2016)  . Peritoneal dialysis catheter in place [facility])   . Restless leg     Patient Active Problem List   Diagnosis Date Noted  . Dialysis patient (HCC) 04/23/2017  . Acute pulmonary edema (HCC)   . ESRD on hemodialysis (HCC)   . Reactive depression 03/26/2017  . Opioid intoxication (HCC) 11/19/2016  . Metabolic acidosis with respiratory acidosis 11/15/2016  . Altered mental status 11/15/2016  . ESRD (end stage renal disease) (HCC) 10/06/2016  . Noncompliance 10/06/2016  . Anemia of chronic disease 10/06/2016  . Volume overload 10/06/2016  . Secondary hyperparathyroidism (HCC) 10/06/2016  . Hyperkalemia 10/05/2016  . Essential hypertension 10/05/2016  . Acute  diverticulitis 08/11/2016  . Spontaneous bacterial peritonitis (HCC) 08/10/2016  . Hypokalemia 08/09/2016  . Peritonitis associated with peritoneal dialysis (HCC) 08/09/2016  . Dialysis-associated peritonitis (HCC) 08/08/2016  . Nerve pain 08/30/2015  . Renal dialysis status 02/23/2014  . Obstructive sleep apnea of adult 12/22/2013  . Low serum cobalamin 12/22/2013  . Acid reflux 12/22/2013  . Dyslipidemia 12/22/2013  . Adiposity 12/19/2013  . BP (high blood pressure) 12/19/2013  . Focal and segmental hyalinosis 12/19/2013  . End stage kidney disease (HCC) 12/19/2013  . Chronic kidney disease 04/11/2013  . Gout, arthropathy 03/05/2013  . Rheumatoid arthritis (HCC) 03/05/2013  . Plantar fasciitis, bilateral 03/05/2013    Past Surgical History:  Procedure Laterality Date  . AV FISTULA PLACEMENT Right 12/12/2016   Procedure: ARTERIOVENOUS  FISTULA CREATION-RIGHT BRACHIOCEPHALIC;  Surgeon: Maeola Harman, MD;  Location: [facility] OR;  Service: Vascular;  Laterality: Right;  . CAPD REMOVAL N/A 08/10/2016   Procedure: CONTINUOUS AMBULATORY PERITONEAL DIALYSIS  (CAPD) CATHETER REMOVAL;  Surgeon: Claud Kelp, MD;  Location: [facility] OR;  Service: General;  Laterality: N/A;  . DIALYSIS/PERMA CATHETER INSERTION Right 08/03/2016  . INSERTION OF DIALYSIS CATHETER  ~ 2015   peritoneal dialysis  . Placement of Hemodialysis Catheter Left   . Removal of hemodialysis catheter         Home Medications    Prior to Admission medications  Medication Sig Start Date End Date Taking? Authorizing Provider  allopurinol (ZYLOPRIM) 100 MG tablet Take 1 tablet (100 mg total) by mouth every other day. Patient taking differently: Take 100 mg by mouth at bedtime.  11/19/16  Yes Emokpae, Ejiroghene E, MD  amLODipine (NORVASC) 10 MG tablet Take 1 tablet (10 mg total) by mouth at bedtime. 04/26/17  Yes Noralee Stain, DO  clonazePAM (KLONOPIN) 1 MG tablet Take 1 tablet (1 mg total) by mouth 2 (two) times daily  as needed for anxiety. 04/27/17  Yes Jones Bales, NP  metoprolol tartrate (LOPRESSOR) 100 MG tablet Take 100 mg by mouth at bedtime.  11/07/16  Yes [provider]  oxyCODONE-acetaminophen (PERCOCET) 10-325 MG tablet Take 1 tablet by mouth 5 (five) times daily as needed. For pain. 04/27/17  Yes Jones Bales, NP  OXYCONTIN 30 MG 12 hr tablet Take 1 tablet (30 mg total) by mouth every 12 (twelve) hours. 04/27/17  Yes Jones Bales, NP  pantoprazole (PROTONIX) 40 MG tablet Take 40 mg by mouth at bedtime.    Yes [provider]  pregabalin (LYRICA) 25 MG capsule Take 1 capsule (25 mg total) by mouth daily. Patient taking differently: Take 25 mg by mouth daily as needed (for nerve pain).  11/18/16  Yes Emokpae, Ejiroghene E, MD  promethazine (PHENERGAN) 12.5 MG tablet Take 12.5 mg by mouth at bedtime as needed for nausea or vomiting.  10/03/16  Yes [provider]  sevelamer carbonate (RENVELA) 800 MG tablet Take 2 tablets (1,600 mg total) by mouth 3 (three) times daily with meals. 08/11/16  Yes Short, Thea Silversmith, MD  Vitamin D, Ergocalciferol, (DRISDOL) 50000 UNITS CAPS capsule Take 50,000 Units by mouth every Wednesday.    Yes [provider]    Family History Family History  Problem Relation Age of Onset  . Stroke Mother   . Diabetes Father     Social History Social History   Tobacco Use  . Smoking status: Never Smoker  . Smokeless tobacco: Current User    Types: Snuff  . Tobacco comment: snuff- somedays  Substance Use Topics  . Alcohol use: No  . Drug use: No     Allergies   Sulfa antibiotics   Review of Systems Review of Systems  All other systems reviewed and are negative.    Physical Exam Updated Vital Signs BP 131/78   Pulse 87   Temp 98.1 F (36.7 C) (Oral)   Resp 18   Ht 1.676 m (5\' 6" )   Wt 86.6 kg (191 lb)   SpO2 100%   BMI 30.83 kg/m   Physical Exam  Constitutional: He appears well-developed and well-nourished.  No distress.  HENT:  Head: Normocephalic and atraumatic.  Right Ear: External ear normal.  Left Ear: External ear normal.  Eyes: Conjunctivae are normal. Right eye exhibits no discharge. Left eye exhibits no discharge. No scleral icterus.  Neck: Neck supple. No tracheal deviation present.  Cardiovascular: Normal rate, regular rhythm and intact distal pulses.  Pulses:      Radial pulses are 2+ on the right side, and 2+ on the left side.       Dorsalis pedis pulses are 2+ on the right side, and 2+ on the left side.  Pulmonary/Chest: Effort normal and breath sounds normal. No stridor. No respiratory distress. He has no wheezes. He has no rales.  Abdominal: Soft. Bowel sounds are normal. He exhibits no distension. There is no tenderness. There is no rebound and no  guarding.  Musculoskeletal: He exhibits no edema or tenderness.  Normal thrill rue  Neurological: He is alert. He has normal strength. No cranial nerve deficit (no facial droop, extraocular movements intact, no slurred speech) or sensory deficit. He exhibits normal muscle tone. He displays no seizure activity. Coordination normal.  Skin: Skin is warm and dry. No rash noted.  Psychiatric: He has a normal mood and affect.  Nursing note and vitals reviewed.    ED Treatments / Results  Labs (all labs ordered are listed, but only abnormal results are displayed) Labs Reviewed  CBC - Abnormal; Notable for the following components:      Result Value   RBC 4.14 (*)    Hemoglobin 12.3 (*)    Platelets 127 (*)    All other components within normal limits  BASIC METABOLIC PANEL - Abnormal; Notable for the following components:   Potassium 6.3 (*)    Glucose, Bld 100 (*)    BUN 35 (*)    Creatinine, Ser 8.20 (*)    Calcium 8.8 (*)    GFR calc non Af Amer 7 (*)    GFR calc Af Amer 8 (*)    All other components within normal limits    EKG  EKG Interpretation  Date/Time:  Sunday May 27 2017 15:07:01 EST Ventricular Rate:   91 PR Interval:    QRS Duration: 95 QT Interval:  351 QTC Calculation: 432 R Axis:   93 Text Interpretation:  Sinus rhythm Borderline right axis deviation Baseline wander in lead(s) V5 V6 No significant change since last tracing Confirmed by Linwood Dibbles [phone]) on 05/27/2017 3:28:47 PM       Radiology No results found.  Procedures Procedures (including critical care time)  Medications Ordered in ED Medications  sodium polystyrene (KAYEXALATE) 15 GM/60ML suspension 30 g (not administered)     Initial Impression / Assessment and Plan / ED Course  I have reviewed the triage vital signs and the nursing notes.  Pertinent labs & imaging results that were available during my care of the patient were reviewed by me and considered in my medical decision making (see chart for details).   Patient presented to the emergency room with complaints of numbness in all of his extremities.  Patient has otherwise normal neurologic exam.  His sensation is intact.  I explained to the patient that fortunately the symptoms are not consistent with stroke which is what he was concerned about.  When we spoke about this further the patient admits the symptoms have been going off and on for a longer period of time.  His symptoms may be related to neuropathy.  He is followed at a pain clinic.  This patient's potassium level is elevated.  He does not have any EKG changes.  I will give him a dose of Kayexalate do not think he requires emergent dialysis this evening.  I will have him follow-up with his nephrologist and I recommended him calling tomorrow.  Final Clinical Impressions(s) / ED Diagnoses   Final diagnoses:  Paresthesia  Hyperkalemia    ED Discharge Orders    None       Linwood Dibbles, MD 05/27/17 1535

## 2017-05-27 NOTE — Discharge Instructions (Signed)
Your potassium level was elevated today.  He was given a dose of Kayexalate to help with that.  Contact your nephrologist tomorrow to see about having that rechecked.  Return as needed for worsening symptoms

## 2017-05-27 NOTE — ED Triage Notes (Signed)
Patient c/o numbness and tingling in arms and legs bilaterally. Per patient hands and feet get cold.

## 2017-05-28 ENCOUNTER — Encounter: Payer: Self-pay | Admitting: Registered Nurse

## 2017-05-28 ENCOUNTER — Encounter: Payer: Medicare Other | Attending: Physical Medicine & Rehabilitation | Admitting: Registered Nurse

## 2017-05-28 VITALS — BP 148/89 | HR 93

## 2017-05-28 DIAGNOSIS — M1009 Idiopathic gout, multiple sites: Secondary | ICD-10-CM | POA: Diagnosis not present

## 2017-05-28 DIAGNOSIS — F329 Major depressive disorder, single episode, unspecified: Secondary | ICD-10-CM | POA: Diagnosis not present

## 2017-05-28 DIAGNOSIS — G629 Polyneuropathy, unspecified: Secondary | ICD-10-CM | POA: Diagnosis not present

## 2017-05-28 DIAGNOSIS — Z5181 Encounter for therapeutic drug level monitoring: Secondary | ICD-10-CM | POA: Insufficient documentation

## 2017-05-28 DIAGNOSIS — G894 Chronic pain syndrome: Secondary | ICD-10-CM

## 2017-05-28 DIAGNOSIS — N186 End stage renal disease: Secondary | ICD-10-CM

## 2017-05-28 DIAGNOSIS — M722 Plantar fascial fibromatosis: Secondary | ICD-10-CM | POA: Diagnosis not present

## 2017-05-28 DIAGNOSIS — Z79899 Other long term (current) drug therapy: Secondary | ICD-10-CM | POA: Diagnosis not present

## 2017-05-28 DIAGNOSIS — F411 Generalized anxiety disorder: Secondary | ICD-10-CM

## 2017-05-28 DIAGNOSIS — M109 Gout, unspecified: Secondary | ICD-10-CM | POA: Diagnosis not present

## 2017-05-28 DIAGNOSIS — N185 Chronic kidney disease, stage 5: Secondary | ICD-10-CM | POA: Insufficient documentation

## 2017-05-28 MED ORDER — OXYCODONE-ACETAMINOPHEN 10-325 MG PO TABS: 1.0000 | ORAL_TABLET | Freq: Every day | ORAL | 0 refills | Status: AC | PRN

## 2017-05-28 MED ORDER — CITALOPRAM HYDROBROMIDE 10 MG PO TABS: 10.0000 mg | ORAL_TABLET | Freq: Every day | ORAL | 2 refills | Status: AC

## 2017-05-28 MED ORDER — PREGABALIN 25 MG PO CAPS: 25.0000 mg | ORAL_CAPSULE | Freq: Every day | ORAL | 2 refills | Status: AC

## 2017-05-28 MED ORDER — OXYCONTIN 30 MG PO T12A: 30.0000 mg | EXTENDED_RELEASE_TABLET | Freq: Two times a day (BID) | ORAL | 0 refills | Status: AC

## 2017-05-28 NOTE — Progress Notes (Signed)
Subjective:    Patient ID: James Kim, male    DOB: [birth year], 42 y.o.   MRN: [id number]  HPI: [name] is a 42year old male who returns for follow up appointmentfor chronic pain and medication refill. He states his pain is located in his bilateral lower extremities and bilateral feet with tingling and burning. Also reports a right knee pain. He rates his pain 5.   James Kim Morphine equivalent is 159.50 MME. He is also prescribed Klonopin.  We have reviewed the black box warning of using opioids and benzodiazepines. I highlighted the dangers of using these drugs together and discussed the adverse events including respiratory suppression, overdose, cognitive impairment and importance of  compliance with current regimen. He verbalizes understanding, we will continue to monitor and adjust as indicated.     His last Oral swab was done on 10/09/2016, it was consistent.    Pain Inventory Average Pain 8 Pain Right Now 5 My pain is sharp, stabbing and aching  In the last 24 hours, has pain interfered with the following? General activity 10 Relation with others 9 Enjoyment of life 10 What TIME of day is your pain at its worst? morning and evening, evening Sleep (in general) Poor  Pain is worse with: walking, standing and some activites Pain improves with: rest, heat/ice, medication and injections Relief from Meds: 3  Mobility walk without assistance use a walker ability to climb steps?  yes do you drive?  yes Do you have any goals in this area?  yes  Function disabled: date disabled . I need assistance with the following:  bathing and household duties  Neuro/Psych trouble walking anxiety  Prior Studies Any changes since last visit?  no  Physicians involved in your care Any changes since last visit?  no   Family History  Problem Relation Age of Onset  . Stroke Mother   . Diabetes Father    Social History   Socioeconomic History  . Marital  status: Married    Spouse name: None  . Number of children: None  . Years of education: None  . Highest education level: None  Social Needs  . Financial resource strain: None  . Food insecurity - worry: None  . Food insecurity - inability: None  . Transportation needs - medical: None  . Transportation needs - non-medical: None  Occupational History  . None  Tobacco Use  . Smoking status: Never Smoker  . Smokeless tobacco: Current User    Types: Snuff  . Tobacco comment: snuff- somedays  Substance and Sexual Activity  . Alcohol use: No  . Drug use: No  . Sexual activity: Yes  Other Topics Concern  . None  Social History Narrative  . None   Past Surgical History:  Procedure Laterality Date  . AV FISTULA PLACEMENT Right 12/12/2016   Procedure: ARTERIOVENOUS  FISTULA CREATION-RIGHT BRACHIOCEPHALIC;  Surgeon: Maeola Harman, MD;  Location: [facility] OR;  Service: Vascular;  Laterality: Right;  . CAPD REMOVAL N/A 08/10/2016   Procedure: CONTINUOUS AMBULATORY PERITONEAL DIALYSIS  (CAPD) CATHETER REMOVAL;  Surgeon: Claud Kelp, MD;  Location: [facility] OR;  Service: General;  Laterality: N/A;  . DIALYSIS/PERMA CATHETER INSERTION Right 08/03/2016  . INSERTION OF DIALYSIS CATHETER  ~ 2015   peritoneal dialysis  . Placement of Hemodialysis Catheter Left   . Removal of hemodialysis catheter     Past Medical History:  Diagnosis Date  . Anxiety   . Arthritis    "from my  knees down" (08/08/2016)- RA  . Childhood asthma   . 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 Dialysis-associated peritonitis (HCC) 08/08/2016  . Eczema   . ESRD on peritoneal dialysis (HCC)    "01/29/2014-08/07/2016" (08/08/2016)  . GERD (gastroesophageal reflux disease)   . Heel spur    "both feet" (08/08/2016)  . Hypertension   . Noncompliance   . OSA on CPAP   . Pain management    "for pain BLE; knees down thru feet" (08/08/2016)  . Peritoneal dialysis catheter in place [facility])   . Restless leg    BP (!)  148/89   Pulse 93   SpO2 99%   Opioid Risk Score:  1 Fall Risk Score:  `1  Depression screen PHQ 2/9  Depression screen [facility] 2/9 02/16/2017 01/19/2016 08/03/2015 01/25/2015 08/10/2014  Decreased Interest 0 1 1 0 2  Down, Depressed, Hopeless 0 1 1 0 1  PHQ - 2 Score 0 2 2 0 3  Altered sleeping - - 2 - 3  Tired, decreased energy - - 1 - 3  Change in appetite - - 1 - 3  Feeling bad or failure about yourself  - - 0 - 1  Trouble concentrating - - 0 - 2  Moving slowly or fidgety/restless - - 0 - 0  Suicidal thoughts - - 0 - 0  PHQ-9 Score - - 6 - 15  Difficult doing work/chores - - Not difficult at all - -    Review of Systems  Constitutional: Positive for appetite change and unexpected weight change.  HENT: Negative.   Eyes: Negative.   Respiratory: Positive for apnea.   Cardiovascular: Positive for leg swelling.  Gastrointestinal: Positive for nausea.  Endocrine: Negative.   Genitourinary: Positive for difficulty urinating.  Musculoskeletal: Positive for gait problem and myalgias.  Skin: Negative.   Allergic/Immunologic: Negative.   Psychiatric/Behavioral: The patient is nervous/anxious.   All other systems reviewed and are negative.      Objective:   Physical Exam  Constitutional: He appears well-developed and well-nourished.  HENT:  Head: Normocephalic and atraumatic.  Neck: Normal range of motion. Neck supple.  Cardiovascular: Normal rate and regular rhythm.  Pulmonary/Chest: Effort normal and breath sounds normal.  Musculoskeletal:  Normal Muscle Bulk and Muscle Testing Reveals: Upper Extremities: Full ROM and Muscle Strength 5/5 Lower Extremities: Full ROM and Muscle Strength 5/5 Right: Lower Extremities Flexion Produces Pain into Right Patella Arises from chair slowly Antalgic gait  Neurological: He is alert.  Skin: Skin is warm and dry.  Psychiatric: He has a normal mood and affect.  Nursing note and vitals reviewed.         Assessment & Plan:    1.Chronic foot pain with history of plantar fasciitis/ Gout Arthropathy/ Polyneuropathy: 05/28/2017 Refilled:Oxycodone 10/325 mg one tablet 5 times a dayas needed #150 and Oxycontin 30mg  one capsule every 12 hours #40. Continue Lyrica. We will continue the opioid monitoring program this consists of, regular clinic visits, examinations, urine drug screen, pill counts as well as use of Controlled Substance Reporting System. 2.Chronic Kidney Disease stage V:On Hemo-Dialysis/ Nephrology Following. 05/28/2017 3. Anxiety/ Reactive Depression: Continue Klonopin and Celexa. 05/28/2017  20  minutes of face to face patient care time was spent during this visit. All questions were encouraged and answered.   F/U in 1 month

## 2017-05-28 NOTE — Patient Instructions (Signed)
Dialysis Diet  Dialysis is a treatment that cleans your blood. It is used when the kidneys are damaged. When you need dialysis, you should watch your diet. This is because some nutrients can build up in your blood between treatments and make you sick. These nutrients are:   Potassium.   Phosphorus.   Sodium.    Your doctor or dietitian will:   Tell you how much of these you can have.   Tell you if you need to look out for other nutrients too.   Help you plan meals.   Tell you how much to drink each day.    What do I need to know about this diet?   Limit potassium. Potassium is in milk, fruits, and vegetables.   Limit phosphorus. Phosphorus is in milk, cheese, beans, nuts, and carbonated beverages.   Limit salt (sodium). Foods that have a lot sodium include processed and cured meats, ready-made frozen meals, canned vegetables, and salty snack foods.   Do not use salt substitutes.   Try not to eat whole-grain foods and foods that have a lot of fiber.   Follow your doctor's instructions about how much to drink. You may be told to:  ? Write down what you drink.  ? Write down foods you eat that are made mostly from water, such as gelatin and soups.  ? Drink from small cups.   Ask your doctor if you should take a medicine that binds phosphorus.   Take vitamin and mineral supplements only as told by your doctor.   Eat meat, poultry, fish, and eggs. Limit nuts and beans.   Before you cook potatoes, cut them into small pieces. Then boil them in unsalted water.   Drain all fluid from cooked vegetables and canned fruits before you eat them.  What foods can I eat?  Grains  White bread. White rice. Cooked cereal. Unsalted popcorn. Tortillas. Pasta.  Vegetables  Fresh or frozen broccoli, carrots, and green beans. Cabbage. Cauliflower. Celery. Cucumbers. Eggplant. Radishes. Zucchini.  Fruits  Apples. Fresh or frozen berries. Fresh or canned pears, peaches, and pineapple. Grapes. Plums.  Meats and Other Protein  Sources  Fresh or frozen beef, pork, chicken, and fish. Eggs. Low-sodium canned tuna or salmon.  Dairy  Cream cheese. Heavy cream. Ricotta cheese.  Beverages  Apple cider. Cranberry juice. Grape juice. Lemonade. Black coffee.  Condiments  Herbs. Spices. Jam and jelly. Honey.  Sweets and Desserts  Sherbet. Cakes. Cookies.  Fats and Oils  Olive oil, canola oil, and safflower oil.  Other  Non-dairy creamer. Non-dairy whipped topping. Homemade broth without salt.  The items listed above may not be a complete list of recommended foods or beverages. Contact your dietitian for more options.  What foods are not recommended?  Grains  Whole-grain bread. Whole-grain pasta. High-fiber cereal.  Vegetables  Potatoes. Beets. Tomatoes. Winter squash and pumpkin. Asparagus. Spinach. Parsnips.  Fruits  Star fruit. Bananas. Oranges. Kiwi. Nectarines. Prunes. Melon. Dried fruit. Avocado.  Meats and Other Protein Sources  Canned, smoked, and cured meats. Packaged luncheon meat. Sardines. Nuts and seeds. Peanut butter. Beans and legumes.  Dairy  Milk. Buttermilk. Yogurt. Cheese and cottage cheese. Processed cheese spreads.  Beverages  Orange juice. Prune juice. Carbonated soft drinks.  Condiments  Salt. Salt substitutes. Soy sauce.  Sweets and Desserts  Ice cream. Chocolate. Candied nuts.  Fats and Oils  Butter. Margarine.  Other  Ready-made frozen meals. Canned soups.  The items listed above may not be a   complete list of foods and beverages to avoid. Contact your dietitian for more information.  This information is not intended to replace advice given to you by your health care provider. Make sure you discuss any questions you have with your health care provider.  Document Released: 10/24/2011 Document Revised: 09/30/2015 Document Reviewed: 11/25/2013  Elsevier Interactive Patient Education  2018 [employer]

## 2017-05-29 DIAGNOSIS — D631 Anemia in chronic kidney disease: Secondary | ICD-10-CM | POA: Diagnosis not present

## 2017-05-29 DIAGNOSIS — D509 Iron deficiency anemia, unspecified: Secondary | ICD-10-CM | POA: Diagnosis not present

## 2017-05-29 DIAGNOSIS — N186 End stage renal disease: Secondary | ICD-10-CM | POA: Diagnosis not present

## 2017-05-31 DIAGNOSIS — R404 Transient alteration of awareness: Secondary | ICD-10-CM | POA: Diagnosis not present

## 2017-05-31 DIAGNOSIS — R531 Weakness: Secondary | ICD-10-CM | POA: Diagnosis not present

## 2017-06-01 DIAGNOSIS — T82898A Other specified complication of vascular prosthetic devices, implants and grafts, initial encounter: Secondary | ICD-10-CM | POA: Diagnosis not present

## 2017-06-01 DIAGNOSIS — T82858A Stenosis of vascular prosthetic devices, implants and grafts, initial encounter: Secondary | ICD-10-CM | POA: Diagnosis not present

## 2017-06-05 DIAGNOSIS — D631 Anemia in chronic kidney disease: Secondary | ICD-10-CM | POA: Diagnosis present

## 2017-06-05 DIAGNOSIS — M109 Gout, unspecified: Secondary | ICD-10-CM | POA: Diagnosis present

## 2017-06-05 DIAGNOSIS — J9601 Acute respiratory failure with hypoxia: Secondary | ICD-10-CM | POA: Diagnosis not present

## 2017-06-05 DIAGNOSIS — E118 Type 2 diabetes mellitus with unspecified complications: Secondary | ICD-10-CM | POA: Diagnosis not present

## 2017-06-05 DIAGNOSIS — Z992 Dependence on renal dialysis: Secondary | ICD-10-CM | POA: Diagnosis not present

## 2017-06-05 DIAGNOSIS — E785 Hyperlipidemia, unspecified: Secondary | ICD-10-CM | POA: Diagnosis present

## 2017-06-05 DIAGNOSIS — G931 Anoxic brain damage, not elsewhere classified: Secondary | ICD-10-CM | POA: Diagnosis not present

## 2017-06-05 DIAGNOSIS — R400 Somnolence: Secondary | ICD-10-CM | POA: Diagnosis not present

## 2017-06-05 DIAGNOSIS — G40909 Epilepsy, unspecified, not intractable, without status epilepticus: Secondary | ICD-10-CM | POA: Diagnosis present

## 2017-06-05 DIAGNOSIS — Z9119 Patient's noncompliance with other medical treatment and regimen: Secondary | ICD-10-CM | POA: Diagnosis not present

## 2017-06-05 DIAGNOSIS — G4733 Obstructive sleep apnea (adult) (pediatric): Secondary | ICD-10-CM | POA: Diagnosis not present

## 2017-06-05 DIAGNOSIS — I132 Hypertensive heart and chronic kidney disease with heart failure and with stage 5 chronic kidney disease, or end stage renal disease: Secondary | ICD-10-CM | POA: Diagnosis not present

## 2017-06-05 DIAGNOSIS — K219 Gastro-esophageal reflux disease without esophagitis: Secondary | ICD-10-CM | POA: Diagnosis present

## 2017-06-05 DIAGNOSIS — Z515 Encounter for palliative care: Secondary | ICD-10-CM | POA: Diagnosis not present

## 2017-06-05 DIAGNOSIS — I5082 Biventricular heart failure: Secondary | ICD-10-CM | POA: Diagnosis not present

## 2017-06-05 DIAGNOSIS — Z9115 Patient's noncompliance with renal dialysis: Secondary | ICD-10-CM | POA: Diagnosis not present

## 2017-06-05 DIAGNOSIS — F329 Major depressive disorder, single episode, unspecified: Secondary | ICD-10-CM | POA: Diagnosis not present

## 2017-06-05 DIAGNOSIS — Z9911 Dependence on respirator [ventilator] status: Secondary | ICD-10-CM | POA: Diagnosis not present

## 2017-06-05 DIAGNOSIS — J90 Pleural effusion, not elsewhere classified: Secondary | ICD-10-CM | POA: Diagnosis not present

## 2017-06-05 DIAGNOSIS — K5730 Diverticulosis of large intestine without perforation or abscess without bleeding: Secondary | ICD-10-CM | POA: Diagnosis not present

## 2017-06-05 DIAGNOSIS — E441 Mild protein-calorie malnutrition: Secondary | ICD-10-CM | POA: Diagnosis not present

## 2017-06-05 DIAGNOSIS — E8779 Other fluid overload: Secondary | ICD-10-CM | POA: Diagnosis not present

## 2017-06-05 DIAGNOSIS — G9341 Metabolic encephalopathy: Secondary | ICD-10-CM | POA: Diagnosis not present

## 2017-06-05 DIAGNOSIS — R092 Respiratory arrest: Secondary | ICD-10-CM | POA: Diagnosis not present

## 2017-06-05 DIAGNOSIS — I248 Other forms of acute ischemic heart disease: Secondary | ICD-10-CM | POA: Diagnosis present

## 2017-06-05 DIAGNOSIS — J811 Chronic pulmonary edema: Secondary | ICD-10-CM | POA: Diagnosis not present

## 2017-06-05 DIAGNOSIS — J9600 Acute respiratory failure, unspecified whether with hypoxia or hypercapnia: Secondary | ICD-10-CM | POA: Diagnosis not present

## 2017-06-05 DIAGNOSIS — E875 Hyperkalemia: Secondary | ICD-10-CM | POA: Diagnosis not present

## 2017-06-05 DIAGNOSIS — E872 Acidosis: Secondary | ICD-10-CM | POA: Diagnosis present

## 2017-06-05 DIAGNOSIS — I10 Essential (primary) hypertension: Secondary | ICD-10-CM | POA: Diagnosis not present

## 2017-06-05 DIAGNOSIS — N186 End stage renal disease: Secondary | ICD-10-CM | POA: Diagnosis not present

## 2017-06-05 DIAGNOSIS — I120 Hypertensive chronic kidney disease with stage 5 chronic kidney disease or end stage renal disease: Secondary | ICD-10-CM | POA: Diagnosis not present

## 2017-06-05 DIAGNOSIS — M797 Fibromyalgia: Secondary | ICD-10-CM | POA: Diagnosis present

## 2017-06-05 DIAGNOSIS — R4020 Unspecified coma: Secondary | ICD-10-CM | POA: Diagnosis not present

## 2017-06-05 DIAGNOSIS — F419 Anxiety disorder, unspecified: Secondary | ICD-10-CM | POA: Diagnosis not present

## 2017-06-05 DIAGNOSIS — I469 Cardiac arrest, cause unspecified: Secondary | ICD-10-CM | POA: Diagnosis not present

## 2017-06-05 DIAGNOSIS — R569 Unspecified convulsions: Secondary | ICD-10-CM | POA: Diagnosis not present

## 2017-06-05 DIAGNOSIS — Z66 Do not resuscitate: Secondary | ICD-10-CM | POA: Diagnosis not present

## 2017-06-05 DIAGNOSIS — M069 Rheumatoid arthritis, unspecified: Secondary | ICD-10-CM | POA: Diagnosis present

## 2017-06-05 DIAGNOSIS — S2243XA Multiple fractures of ribs, bilateral, initial encounter for closed fracture: Secondary | ICD-10-CM | POA: Diagnosis not present

## 2017-06-07 DIAGNOSIS — N186 End stage renal disease: Secondary | ICD-10-CM | POA: Diagnosis not present

## 2017-06-07 DIAGNOSIS — Z992 Dependence on renal dialysis: Secondary | ICD-10-CM | POA: Diagnosis not present

## 2017-06-25 ENCOUNTER — Encounter: Payer: Medicare Other | Admitting: Registered Nurse

## 2017-07-06 DEATH — deceased

## 2018-03-08 IMAGING — CT CT ABD-PELV W/ CM
2 of 4 series · 8 of 46 positions shown, 9 images · IV contrast (Iodine)
Comparison: None.

CLINICAL DATA: Recurrent SBP requiring removal of PD catheter.
Still having pain despite multiple courses of antibiotics, IV
antibiotics, and removal of PD catheter. Please assess for abscess.

EXAM:
CT ABDOMEN AND PELVIS WITH CONTRAST
TECHNIQUE: Multidetector CT imaging of the abdomen and pelvis was performed
using the standard protocol following bolus administration of
intravenous contrast.
CONTRAST:  100mL [id number] IOPAMIDOL ([id number]) INJECTION 61%

[Series 201: routine, idose (2) · axial · 0.93mm/px · z∈[-719,-354]mm · 5 of 99 slices shown, 6 images]
[im 13/99  soft-tissue]
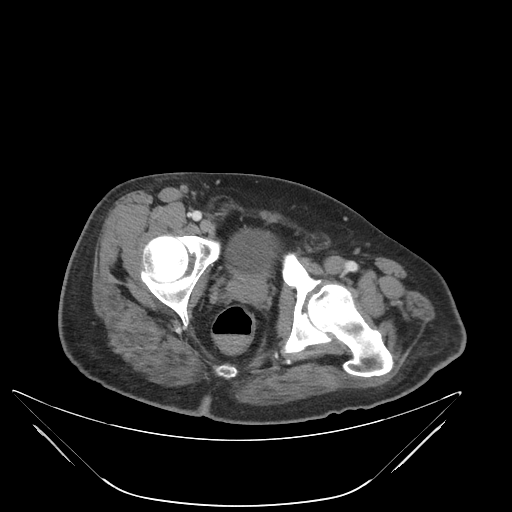
[im 13/99  bone]
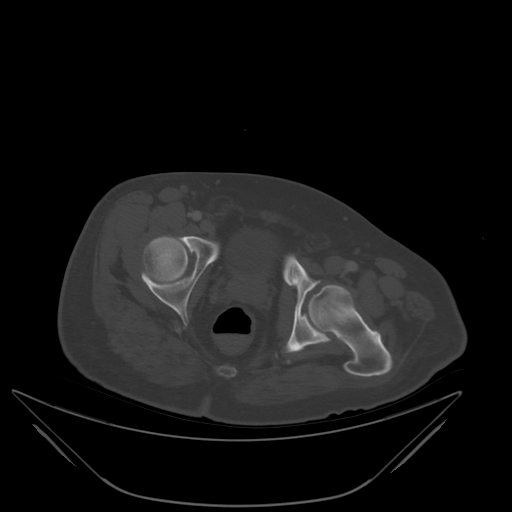
[im 29/99  soft-tissue]
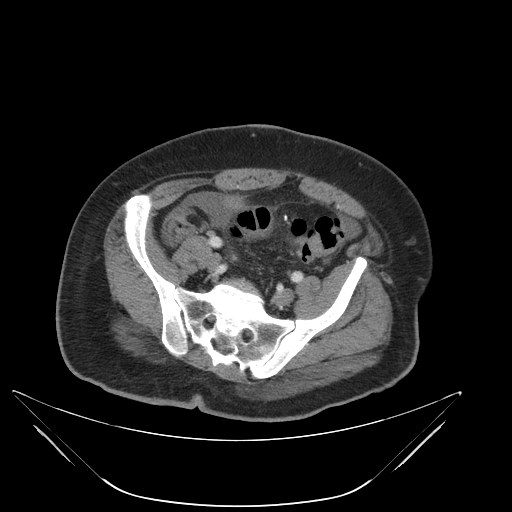
[im 50/99  soft-tissue]
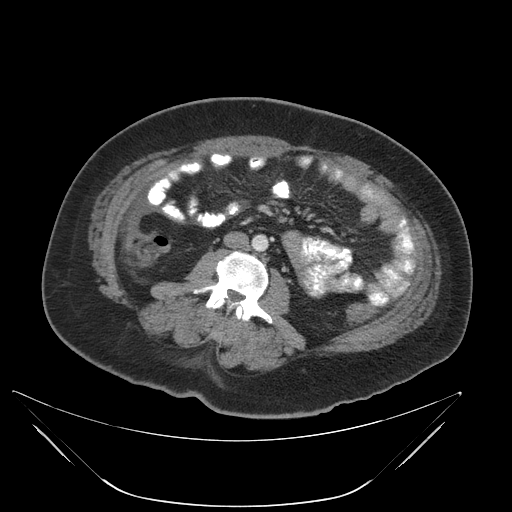
[im 70/99  soft-tissue]
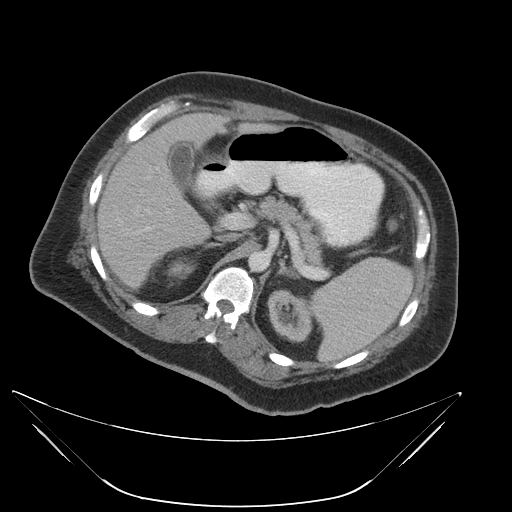
[im 86/99  soft-tissue]
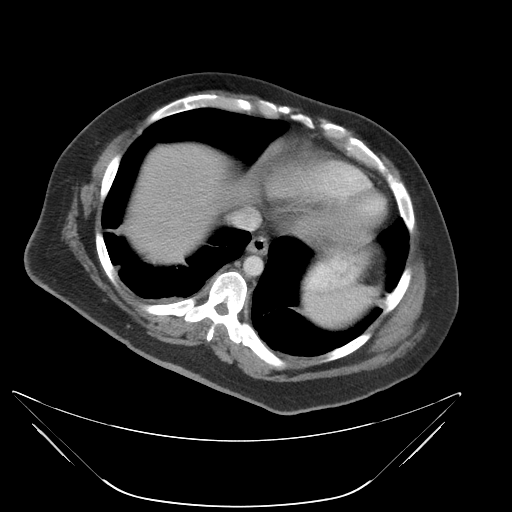

[Series 206: coronals, idose (2) · coronal · 0.45mm/px · 3 of 132 slices shown]
[im 44/132  soft-tissue]
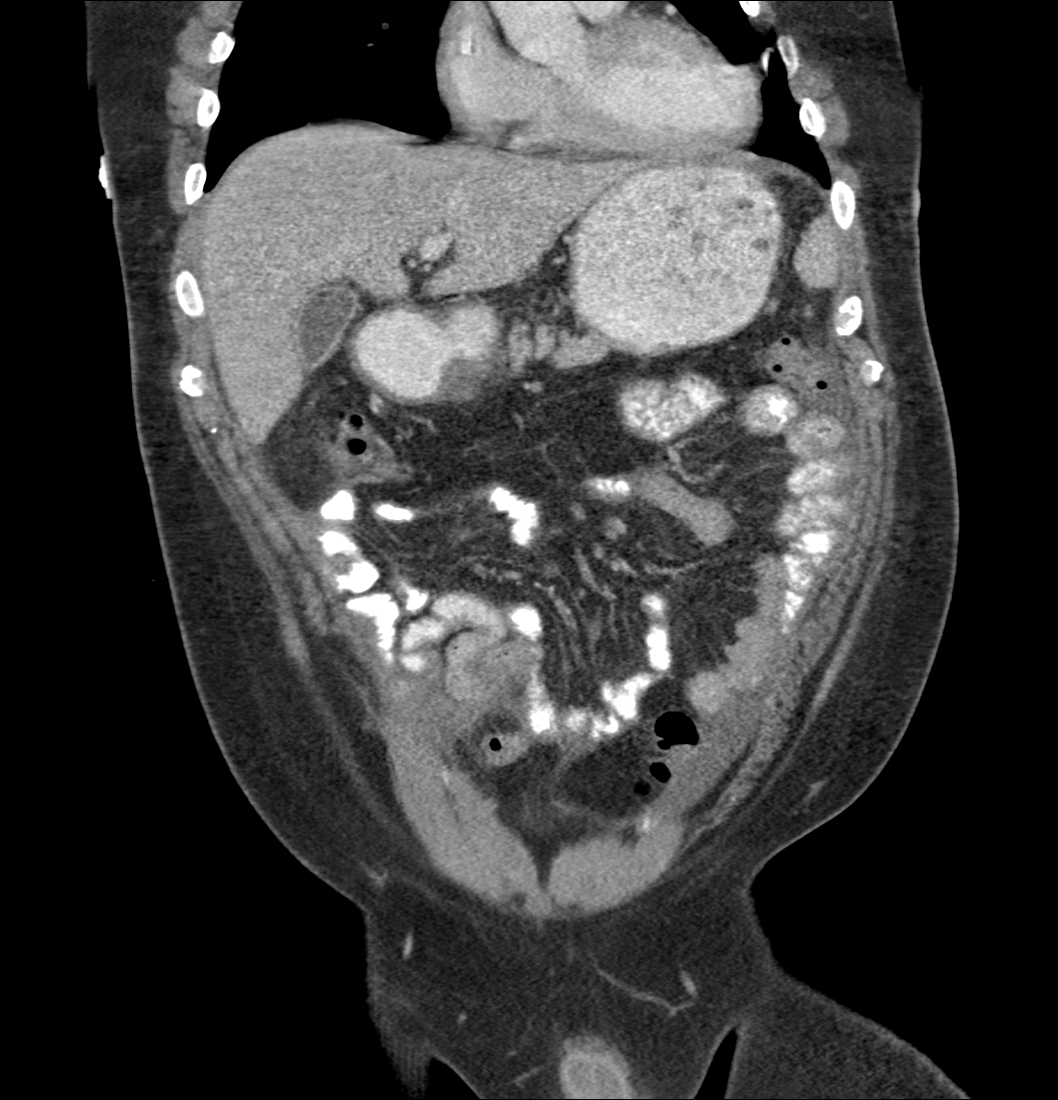
[im 59/132  soft-tissue]
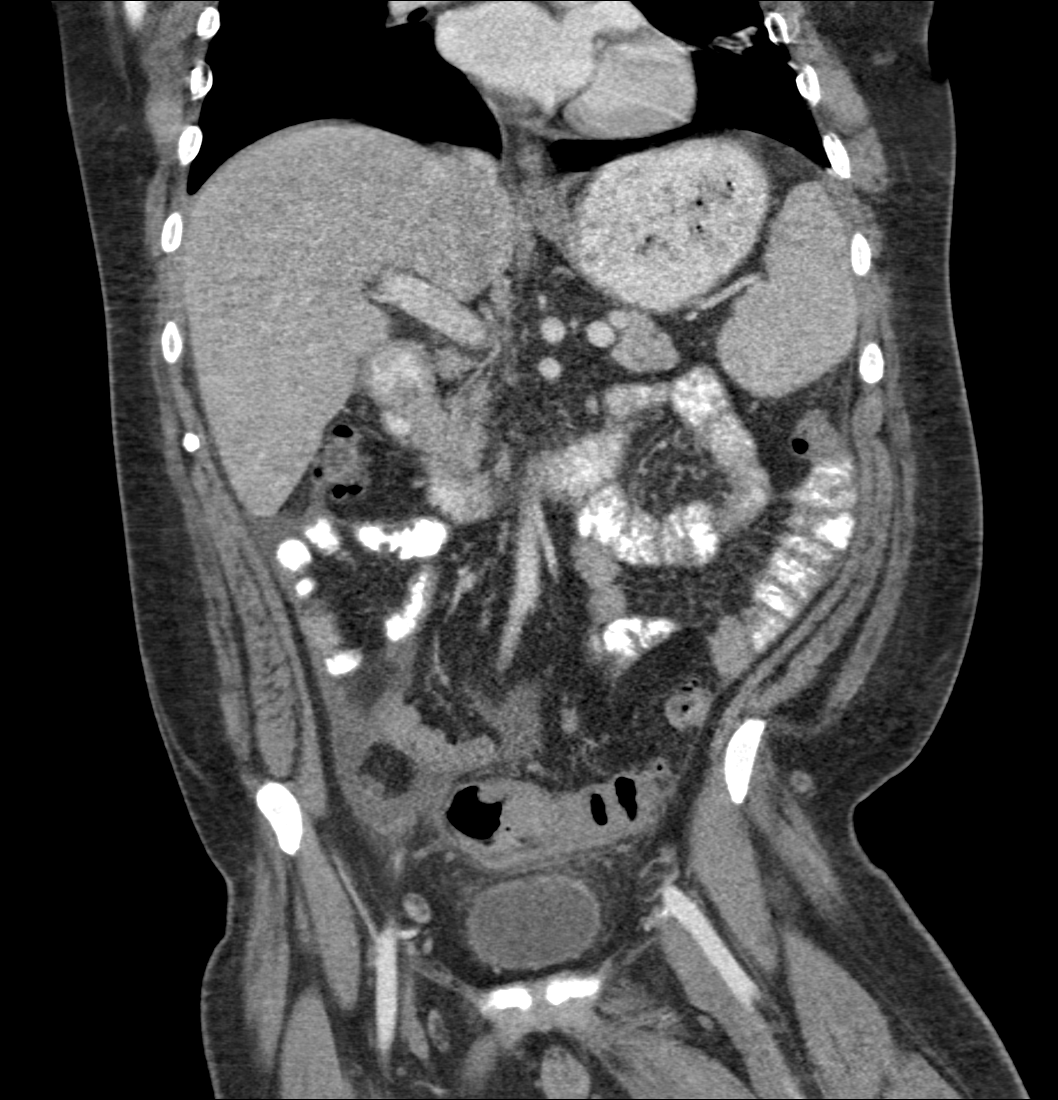
[im 73/132  soft-tissue]
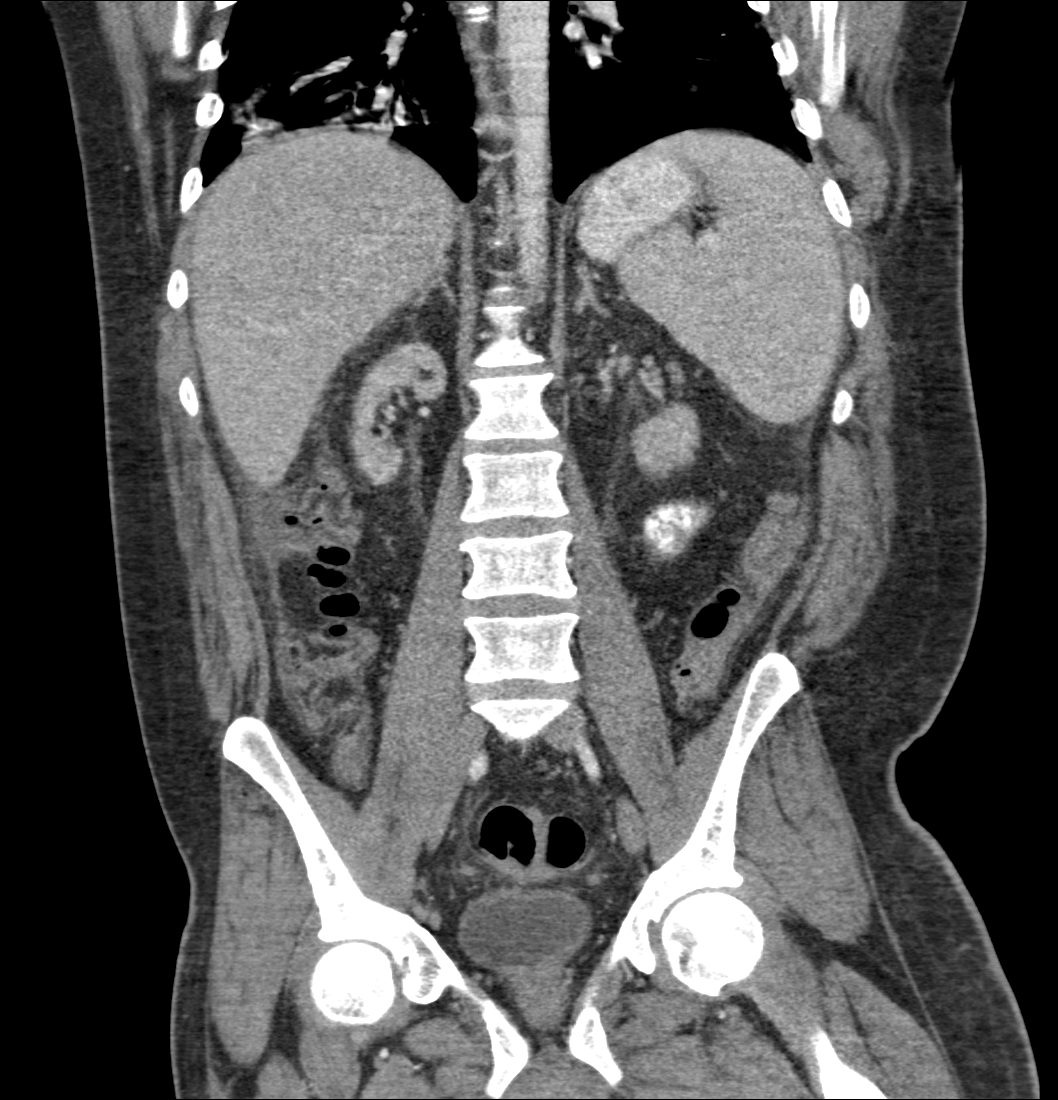

[8 of 46 positions shown; findings below may reference images not displayed]

FINDINGS: Lower chest: Linear atelectasis. No evidence of pneumonia or edema.
Heart normal in size.

Hepatobiliary: Liver mildly enlarged measuring 23 cm in greatest
transverse dimension. No liver mass or focal lesion. Normal
gallbladder. No bile duct dilation.

Pancreas: Unremarkable. No pancreatic ductal dilatation or
surrounding inflammatory changes.

Spleen: Spleen mildly enlarged measuring 14.4 x 5.1 x 13.4 cm. No
splenic mass or focal lesion.

Adrenals/Urinary Tract: No adrenal masses.

Significant bilateral renal cortical thinning leading to small
kidneys. No renal masses or stones. No hydronephrosis. Normal
ureters.

Bladder is mildly distended. No mass or stone. There is hazy
inflammatory type stranding above the bladder.

Stomach/Bowel: There is wall thickening an adjacent hazy
inflammatory infiltration surrounding the mid sigmoid colon where
there are multiple diverticula. There is some adjacent free fluid
and no defined abscess. There is no extraluminal air.

There are scattered colonic diverticula with no other associated
inflammatory change.

Vascular/Lymphatic: No significant vascular findings are present. No
enlarged abdominal or pelvic lymph nodes.

Reproductive: Unremarkable

Other: Small amount ascites collects adjacent to the liver in tracks
along the pericolic gutters and between the use of the the inferior
mesenteric. There are bubbles of air along the low mid right rectus
abdominus muscle with overlying subcutaneous fat infiltration and
small bubbles of air. This was likely the insertion site with the
previous catheter. No free intraperitoneal air.

Musculoskeletal: No fracture or acute finding. No osteoblastic or
osteolytic lesions.
IMPRESSION: 1. No defined abscess.
2. Wall thickening and adjacent inflammation along the mid sigmoid
colon where there are multiple diverticula. This suggests
uncomplicated acute diverticulitis.
3. Small amount ascites.
4. Small bubbles of air and some fatty infiltration along the right
mid abdomen and rectus abdominus muscle which presumably was the
insertion site with the previous catheter. No abdominal wall
abscess.
5. Mild pattern splenomegaly.
6. Atrophic kidneys consistent with renal failure. No
hydronephrosis.
